# Patient Record
Sex: Female | Born: 1970 | Race: Black or African American | Hispanic: No | Marital: Single | Smoking: Former smoker
Health system: Southern US, Community
[De-identification: ages and names within clinical notes are randomized; demographics above are authoritative.]

## PROBLEM LIST (undated history)

## (undated) DIAGNOSIS — M25551 Pain in right hip: Secondary | ICD-10-CM

## (undated) DIAGNOSIS — M25552 Pain in left hip: Secondary | ICD-10-CM

## (undated) DIAGNOSIS — Z1231 Encounter for screening mammogram for malignant neoplasm of breast: Secondary | ICD-10-CM

## (undated) DIAGNOSIS — N63 Unspecified lump in unspecified breast: Secondary | ICD-10-CM

## (undated) DIAGNOSIS — F329 Major depressive disorder, single episode, unspecified: Secondary | ICD-10-CM

## (undated) DIAGNOSIS — F32A Depression, unspecified: Secondary | ICD-10-CM

## (undated) DIAGNOSIS — F319 Bipolar disorder, unspecified: Secondary | ICD-10-CM

## (undated) DIAGNOSIS — I10 Essential (primary) hypertension: Secondary | ICD-10-CM

## (undated) DIAGNOSIS — F29 Unspecified psychosis not due to a substance or known physiological condition: Secondary | ICD-10-CM

## (undated) DIAGNOSIS — G5600 Carpal tunnel syndrome, unspecified upper limb: Secondary | ICD-10-CM

## (undated) DIAGNOSIS — F1910 Other psychoactive substance abuse, uncomplicated: Secondary | ICD-10-CM

## (undated) DIAGNOSIS — M79606 Pain in leg, unspecified: Secondary | ICD-10-CM

## (undated) DIAGNOSIS — N6320 Unspecified lump in the left breast, unspecified quadrant: Secondary | ICD-10-CM

## (undated) HISTORY — DX: Carpal tunnel syndrome, unspecified upper limb: G56.00

## (undated) HISTORY — PX: TONSILLECTOMY: SUR1361

## (undated) HISTORY — PX: APPENDECTOMY: SHX54

---

## 2005-04-06 ENCOUNTER — Ambulatory Visit: Payer: Self-pay | Admitting: Cardiology

## 2005-06-01 ENCOUNTER — Ambulatory Visit: Payer: Self-pay

## 2005-06-01 ENCOUNTER — Encounter: Payer: Self-pay | Admitting: Cardiology

## 2005-09-09 ENCOUNTER — Inpatient Hospital Stay (HOSPITAL_COMMUNITY): Admission: AD | Admit: 2005-09-09 | Discharge: 2005-09-12 | Payer: Self-pay | Admitting: Obstetrics

## 2006-07-27 ENCOUNTER — Inpatient Hospital Stay (HOSPITAL_COMMUNITY): Admission: AD | Admit: 2006-07-27 | Discharge: 2006-07-27 | Payer: Self-pay | Admitting: Obstetrics

## 2006-10-05 ENCOUNTER — Inpatient Hospital Stay (HOSPITAL_COMMUNITY): Admission: AD | Admit: 2006-10-05 | Discharge: 2006-10-05 | Payer: Self-pay | Admitting: Obstetrics

## 2006-10-17 ENCOUNTER — Inpatient Hospital Stay (HOSPITAL_COMMUNITY): Admission: AD | Admit: 2006-10-17 | Discharge: 2006-10-20 | Payer: Self-pay | Admitting: Obstetrics

## 2006-10-18 ENCOUNTER — Encounter (INDEPENDENT_AMBULATORY_CARE_PROVIDER_SITE_OTHER): Payer: Self-pay | Admitting: *Deleted

## 2008-06-13 ENCOUNTER — Ambulatory Visit (HOSPITAL_COMMUNITY): Admission: RE | Admit: 2008-06-13 | Discharge: 2008-06-13 | Payer: Self-pay | Admitting: Cardiovascular Disease

## 2008-09-17 ENCOUNTER — Emergency Department (HOSPITAL_COMMUNITY): Admission: EM | Admit: 2008-09-17 | Discharge: 2008-09-17 | Payer: Self-pay | Admitting: *Deleted

## 2009-01-23 ENCOUNTER — Emergency Department (HOSPITAL_COMMUNITY): Admission: EM | Admit: 2009-01-23 | Discharge: 2009-01-23 | Payer: Self-pay | Admitting: Emergency Medicine

## 2009-04-09 ENCOUNTER — Encounter: Admission: RE | Admit: 2009-04-09 | Discharge: 2009-04-09 | Payer: Self-pay | Admitting: Cardiovascular Disease

## 2009-04-21 ENCOUNTER — Ambulatory Visit (HOSPITAL_COMMUNITY): Admission: RE | Admit: 2009-04-21 | Discharge: 2009-04-21 | Payer: Self-pay | Admitting: Obstetrics

## 2010-08-02 ENCOUNTER — Encounter: Payer: Self-pay | Admitting: Cardiovascular Disease

## 2010-08-13 ENCOUNTER — Inpatient Hospital Stay (INDEPENDENT_AMBULATORY_CARE_PROVIDER_SITE_OTHER)
Admission: RE | Admit: 2010-08-13 | Discharge: 2010-08-13 | Disposition: A | Discharge status: Home / Self Care | Payer: Self-pay | Source: Ambulatory Visit | Attending: Emergency Medicine | Admitting: Emergency Medicine

## 2010-08-13 DIAGNOSIS — J019 Acute sinusitis, unspecified: Secondary | ICD-10-CM

## 2010-09-02 ENCOUNTER — Observation Stay: Admission: AD | Admit: 2010-09-02 | Payer: Self-pay | Source: Ambulatory Visit | Admitting: Cardiovascular Disease

## 2010-09-02 ENCOUNTER — Emergency Department (HOSPITAL_COMMUNITY): Payer: 59

## 2010-09-02 ENCOUNTER — Encounter (HOSPITAL_COMMUNITY): Payer: Self-pay | Admitting: Radiology

## 2010-09-02 ENCOUNTER — Observation Stay (HOSPITAL_COMMUNITY)
Admission: EM | Admit: 2010-09-02 | Discharge: 2010-09-03 | Disposition: A | Discharge status: Home / Self Care | Payer: 59 | Attending: Cardiovascular Disease | Admitting: Cardiovascular Disease

## 2010-09-02 DIAGNOSIS — R599 Enlarged lymph nodes, unspecified: Secondary | ICD-10-CM | POA: Insufficient documentation

## 2010-09-02 DIAGNOSIS — R509 Fever, unspecified: Secondary | ICD-10-CM | POA: Insufficient documentation

## 2010-09-02 DIAGNOSIS — M542 Cervicalgia: Secondary | ICD-10-CM | POA: Insufficient documentation

## 2010-09-02 DIAGNOSIS — J020 Streptococcal pharyngitis: Principal | ICD-10-CM | POA: Insufficient documentation

## 2010-09-02 HISTORY — DX: Essential (primary) hypertension: I10

## 2010-09-02 LAB — CBC
HCT: 34.1 % — ABNORMAL LOW (ref 36.0–46.0)
Hemoglobin: 11.8 g/dL — ABNORMAL LOW (ref 12.0–15.0)
MCH: 30.6 pg (ref 26.0–34.0)
MCHC: 34.6 g/dL (ref 30.0–36.0)
Platelets: 227 10*3/uL (ref 150–400)
RBC: 3.86 MIL/uL — ABNORMAL LOW (ref 3.87–5.11)
WBC: 10.8 10*3/uL — ABNORMAL HIGH (ref 4.0–10.5)

## 2010-09-02 LAB — BASIC METABOLIC PANEL
BUN: 3 mg/dL — ABNORMAL LOW (ref 6–23)
Calcium: 8.9 mg/dL (ref 8.4–10.5)
Glucose, Bld: 91 mg/dL (ref 70–99)
Potassium: 3.2 mEq/L — ABNORMAL LOW (ref 3.5–5.1)

## 2010-09-02 LAB — DIFFERENTIAL
Basophils Absolute: 0 10*3/uL (ref 0.0–0.1)
Basophils Relative: 0 % (ref 0–1)
Eosinophils Relative: 0 % (ref 0–5)
Lymphs Abs: 1.5 10*3/uL (ref 0.7–4.0)
Monocytes Absolute: 0.9 10*3/uL (ref 0.1–1.0)
Neutro Abs: 8.3 10*3/uL — ABNORMAL HIGH (ref 1.7–7.7)
Neutrophils Relative %: 77 % (ref 43–77)

## 2010-09-02 LAB — RAPID STREP SCREEN (MED CTR MEBANE ONLY): Streptococcus, Group A Screen (Direct): POSITIVE — AB

## 2010-09-08 LAB — CULTURE, BLOOD (ROUTINE X 2): Culture: NO GROWTH

## 2010-09-16 NOTE — Discharge Summary (Signed)
  [name], [name]            ACCOUNT NO.:  [id number]  MEDICAL RECORD NO.:  [id number]           PATIENT TYPE:  I  LOCATION:  5149                         FACILITY:  [facility]  PHYSICIAN:  Ricki Rodriguez, M.D.  DATE OF BIRTH:  02-03-1971  DATE OF ADMISSION:  09/02/2010 DATE OF DISCHARGE:  09/03/2010                              DISCHARGE SUMMARY   FINAL DIAGNOSES: 1. Streptococcal pharyngitis with cervical lymphadenopathy. 2. Anxiety. 3. Dysphagia secondary to Streptococcal pharyngitis and     lymphadenopathy.  DISCHARGE MEDICATIONS: 1. Amoxicillin 500 mg 1 every 8 hours x10 days. 2. Acetaminophen 325 mg 1 every 6 hours as needed. 3. Loratadine 10 mg 1 daily. 4. Oxycodone 5 mg 1 every 6 hours as needed for 5 days. 5. Celexa 20 mg 1 daily. 6. Ferrous sulfate 325 mg 1 every other day. 7. Metoprolol XL succinate 25 mg daily. 8. Multivitamin 1 daily. 9. Vitamin C 500 mg daily.  DISCHARGE DIET:  Low-sodium, heart-healthy diet.  DISCHARGE ACTIVITY:  The patient is to increase activity slowly as tolerated.  Followup by Dr. Orpah Cobb in 10 days.  The patient is to call [phone] for appointment.  HISTORY:  This is a 40 year old black female with a 1-week history of sore throat and neck pain, had been feeling weak, dizzy, and difficulty swallowing.  The patient did not have any antibiotic treatment.  PHYSICAL EXAMINATION:  VITAL SIGNS:  Temperature 102.8, pulse 96, respirations 20, blood pressure 139/75, oxygen saturation 100%. GENERAL:  The patient is a well-built, well-nourished black female, in some respiratory distress. HEENT:  The patient is normocephalic, atraumatic with brown eyes. Conjunctivae pink.  Throat red. NECK:  Tender anteriorly bilaterally. LUNGS:  Clear bilaterally. HEART:  Normal S1 and S2. ABDOMEN:  Soft. EXTREMITIES:  No edema, cyanosis, or clubbing. SKIN:  Warm and dry. NEUROLOGIC:  The patient moves all 4 extremities.  She is alert  and oriented x3.  LABORATORY DATA:  Revealed near normal hemoglobin, hematocrit, WBC count, platelet count, near normal electrolytes except for potassium of 3.2.  Blood cultures negative.  Strep group A screen positive.  HOSPITAL COURSE:  The patient was placed in observation.  She had x-ray of the soft tissue neck that failed to show any epiglottitis.  Her strep screen was positive for which she received IV antibiotic followed by amoxicillin for 10 days.  Her condition improved with 1 day of IV hydration and on September 03, 2010, she was discharged home in satisfactory condition with followup by me in 10 days.     Ricki Rodriguez, M.D.     ASK/MEDQ  D:  09/14/2010  T:  09/14/2010  Job:  [id number]  Electronically Signed by Orpah Cobb M.D. on 09/15/2010 01:29:25 PM

## 2010-10-17 LAB — WET PREP, GENITAL: Yeast Wet Prep HPF POC: NONE SEEN

## 2010-10-17 LAB — URINE MICROSCOPIC-ADD ON

## 2010-10-17 LAB — URINALYSIS, ROUTINE W REFLEX MICROSCOPIC
Nitrite: NEGATIVE
Protein, ur: NEGATIVE mg/dL
Specific Gravity, Urine: 1.004 — ABNORMAL LOW (ref 1.005–1.030)
Urobilinogen, UA: 0.2 mg/dL (ref 0.0–1.0)

## 2010-11-26 NOTE — Discharge Summary (Signed)
NAMECATHLEEN, [name]            ACCOUNT NO.:  [id number]   MEDICAL RECORD NO.:  [id number]          PATIENT TYPE:  INP   LOCATION:  9136                          FACILITY:  [facility]   PHYSICIAN:  Charles A. Clearance Coots, M.D.DATE OF BIRTH:  1970-11-04   DATE OF ADMISSION:  10/17/2006  DATE OF DISCHARGE:  10/20/2006                               DISCHARGE SUMMARY   ADMITTING DIAGNOSES:  1. Term pregnancy.  2. Spontaneous rupture of membranes.  3. Pregnancy-induced hypertension.  4. Multiparity.  5. Desired permanent sterilization.   DISCHARGE DIAGNOSES:  1. Term pregnancy.  2. Spontaneous rupture of membranes.  3. Pregnancy-induced hypertension.  4. Multiparity.  5. Desired permanent sterilization.  6. Status post normal spontaneous vaginal delivery viable female infant      on October 18, 2006, at 0050, Apgars of 9 at one and 9 at five      minutes, weight of 3810 g, length of 56 cm.  7. Status post postpartum tubal ligation on postoperative day #1.  8. Discharged home on postpartum day #2 in good condition.   REASON FOR ADMISSION:  A 40 year old black female, G7, P5-1-0-6,  estimated date of confinement of October 28, 2006, presented to the office  with leaking of clear fluid.  She also complained of headache.  Prenatal  care was uncomplicated.  Group B strep was positive.  The patient  desired permanent sterilization after delivery of this baby.   PAST MEDICAL HISTORY:   SURGERY:  1. Appendectomy.  2. Tonsillectomy.  3. Ablation for SVT.   ILLNESSES:  SVT.   MEDICATIONS:  Prenatal vitamins, Prevacid.   ALLERGIES:  No known drug allergies.   SOCIAL HISTORY:  Divorced; negative for tobacco, alcohol, or  recreational drug use.   PHYSICAL EXAMINATION:  VITAL SIGNS:  Afebrile, blood pressure 160/105.  LUNGS:  Clear to auscultation bilaterally.  HEART:  Regular rate and rhythm.  ABDOMEN:  Gravid, nontender.  CERVIX:  5 cm dilated, 70% effaced, and vertex at a -2 station.  External fetal monitor revealed uterine contractions every 5-8 minutes.  Tracing was reactive.   ADMITTING LABORATORY VALUES:  Hemoglobin 13, hematocrit 39, white blood  cell count 7500, platelets 268,000.   HOSPITAL COURSE:  The patient was admitted and progressed rapidly to  normal spontaneous vaginal delivery without complications.  She was  taken to the operating room for postpartum tubal ligation on postpartum  day 0, and bilateral partial salpingectomy was performed without  complications.  The remainder of the postpartum course was  uncomplicated.  The patient was discharged home on postpartum day #2 in  good condition.   DISCHARGE DISPOSITION:   MEDICATIONS:  Darvocet-N 100 and ibuprofen as prescribed for pain.  Continue prenatal vitamins.   Routine written instructions were given for discharge after vaginal  delivery and tubal ligation.  The patient is to call our office for a  followup appointment in 6 weeks.      Charles A. Clearance Coots, M.D.  Electronically Signed     CAH/MEDQ  D:  10/20/2006  T:  10/20/2006  Job:  [id number]

## 2010-11-26 NOTE — Op Note (Signed)
NAMEHILLARIE, [name]            ACCOUNT NO.:  [id number]   MEDICAL RECORD NO.:  [id number]          PATIENT TYPE:  INP   LOCATION:  9374                          FACILITY:  [facility]   PHYSICIAN:  Charles A. Clearance Coots, M.D.DATE OF BIRTH:  1971-02-19   DATE OF PROCEDURE:  10/18/2006  DATE OF DISCHARGE:                               OPERATIVE REPORT   PREOPERATIVE DIAGNOSIS:  Desires sterilization.   POSTOPERATIVE DIAGNOSIS:  Desires sterilization.   PROCEDURE:  Bilateral partial salpingectomy.   SURGEON:  Coral Ceo, M.D.   ANESTHESIA:  General.   ESTIMATED BLOOD LOSS:  Negligible.   COMPLICATIONS:  None.   SPECIMEN:  Approximately 2-cm segments of right and left fallopian  tubes.   OPERATION:  The patient was brought to the operating room, and after  satisfactory general endotracheal anesthesia, the abdomen was prepped  and draped in usual sterile fashion.  A small inferior umbilical  incision was made with the scalpel that was deepened down to the fascia  with curved Mayo scissors bluntly.  The fascia was grasped in the  midline with Kelly forceps and was incised transversely down through the  peritoneum with curved Mayo scissors.  The fascial incision was extended  to the left to the right with the curved Mayo scissors.  The right angle  retractors were placed in the incision.  The right fallopian tube was  identified and was grasped with a Babcock clamp.  Tube was followed from  the corneal end to the fimbrial end and grasped with Babcock clamps and  then regrapsed in the isthmic area of the tube with the Babcock clamp.  Knuckle of tube beneath the Babcock clamp was ligated with 0 plain  catgut, and the section of tube above the knot was excised with  Metzenbaum scissors and submitted to pathology for evaluation.  There  was no active bleeding from the tubal stumps, and they were placed back  in their normal anatomic position.  Same procedure was performed on the  opposite side without complications.  The abdomen was then closed as  follows:  Peritoneum and fascia was closed as one with a continuous  subcuticular suture of 3-0 Monocryl.  Sterile bandage was applied to the  incision closure.  Surgical technician indicated that all needle, sponge  and instrument counts were correct x2.  The patient tolerated the  procedure well, was transported to the recovery room in satisfactory  condition.      Charles A. Clearance Coots, M.D.  Electronically Signed    CAH/MEDQ  D:  10/18/2006  T:  10/18/2006  Job:  [id number]

## 2011-01-11 ENCOUNTER — Other Ambulatory Visit: Payer: Self-pay | Admitting: Cardiovascular Disease

## 2011-01-11 ENCOUNTER — Ambulatory Visit
Admission: RE | Admit: 2011-01-11 | Discharge: 2011-01-11 | Disposition: A | Discharge status: Home / Self Care | Payer: 59 | Source: Ambulatory Visit | Attending: Cardiovascular Disease | Admitting: Cardiovascular Disease

## 2011-01-11 DIAGNOSIS — M25559 Pain in unspecified hip: Secondary | ICD-10-CM

## 2011-01-17 ENCOUNTER — Emergency Department (HOSPITAL_COMMUNITY)
Admission: EM | Admit: 2011-01-17 | Discharge: 2011-01-17 | Disposition: A | Discharge status: Other Acute Inpatient Hospital | Payer: 59 | Attending: Emergency Medicine | Admitting: Emergency Medicine

## 2011-01-17 DIAGNOSIS — N760 Acute vaginitis: Secondary | ICD-10-CM | POA: Insufficient documentation

## 2011-01-17 DIAGNOSIS — A499 Bacterial infection, unspecified: Secondary | ICD-10-CM | POA: Insufficient documentation

## 2011-01-17 DIAGNOSIS — B9689 Other specified bacterial agents as the cause of diseases classified elsewhere: Secondary | ICD-10-CM | POA: Insufficient documentation

## 2011-01-17 DIAGNOSIS — F1910 Other psychoactive substance abuse, uncomplicated: Secondary | ICD-10-CM | POA: Insufficient documentation

## 2011-01-17 DIAGNOSIS — I498 Other specified cardiac arrhythmias: Secondary | ICD-10-CM | POA: Insufficient documentation

## 2011-01-17 DIAGNOSIS — F1010 Alcohol abuse, uncomplicated: Secondary | ICD-10-CM | POA: Insufficient documentation

## 2011-01-17 DIAGNOSIS — N72 Inflammatory disease of cervix uteri: Secondary | ICD-10-CM | POA: Insufficient documentation

## 2011-01-17 DIAGNOSIS — I10 Essential (primary) hypertension: Secondary | ICD-10-CM | POA: Insufficient documentation

## 2011-01-17 DIAGNOSIS — Z139 Encounter for screening, unspecified: Secondary | ICD-10-CM | POA: Insufficient documentation

## 2011-01-17 DIAGNOSIS — R45851 Suicidal ideations: Secondary | ICD-10-CM | POA: Insufficient documentation

## 2011-01-17 DIAGNOSIS — N390 Urinary tract infection, site not specified: Secondary | ICD-10-CM | POA: Insufficient documentation

## 2011-01-17 DIAGNOSIS — A599 Trichomoniasis, unspecified: Secondary | ICD-10-CM | POA: Insufficient documentation

## 2011-01-17 LAB — DIFFERENTIAL
Basophils Absolute: 0 10*3/uL (ref 0.0–0.1)
Basophils Relative: 0 % (ref 0–1)
Eosinophils Absolute: 0.1 10*3/uL (ref 0.0–0.7)
Eosinophils Relative: 2 % (ref 0–5)
Lymphocytes Relative: 55 % — ABNORMAL HIGH (ref 12–46)
Lymphs Abs: 2.5 10*3/uL (ref 0.7–4.0)
Monocytes Relative: 9 % (ref 3–12)
Neutrophils Relative %: 35 % — ABNORMAL LOW (ref 43–77)

## 2011-01-17 LAB — URINALYSIS, ROUTINE W REFLEX MICROSCOPIC
Bilirubin Urine: NEGATIVE
Glucose, UA: NEGATIVE mg/dL
Ketones, ur: NEGATIVE mg/dL
Nitrite: NEGATIVE
Urobilinogen, UA: 0.2 mg/dL (ref 0.0–1.0)

## 2011-01-17 LAB — RAPID URINE DRUG SCREEN, HOSP PERFORMED: Opiates: POSITIVE — AB

## 2011-01-17 LAB — CBC
HCT: 41.5 % (ref 36.0–46.0)
Hemoglobin: 14 g/dL (ref 12.0–15.0)
MCV: 89.8 fL (ref 78.0–100.0)
RDW: 12.5 % (ref 11.5–15.5)
WBC: 4.6 10*3/uL (ref 4.0–10.5)

## 2011-01-17 LAB — COMPREHENSIVE METABOLIC PANEL
Albumin: 3.9 g/dL (ref 3.5–5.2)
Alkaline Phosphatase: 40 U/L (ref 39–117)
BUN: 6 mg/dL (ref 6–23)
CO2: 24 mEq/L (ref 19–32)
Chloride: 101 mEq/L (ref 96–112)
GFR calc Af Amer: >60 mL/min (ref >60)
Glucose, Bld: 97 mg/dL (ref 70–99)
Potassium: 4.2 mEq/L (ref 3.5–5.1)
Total Bilirubin: 0.6 mg/dL (ref 0.3–1.2)
Total Protein: 8.4 g/dL — ABNORMAL HIGH (ref 6.0–8.3)

## 2011-01-17 LAB — ETHANOL: Alcohol, Ethyl (B): <11 mg/dL (ref 0–11)

## 2011-01-17 LAB — URINE MICROSCOPIC-ADD ON

## 2011-01-17 LAB — WET PREP, GENITAL

## 2011-01-18 LAB — GC/CHLAMYDIA PROBE AMP, GENITAL: GC Probe Amp, Genital: NEGATIVE

## 2011-01-19 LAB — URINE CULTURE

## 2011-05-06 ENCOUNTER — Emergency Department (HOSPITAL_COMMUNITY)
Admission: EM | Admit: 2011-05-06 | Discharge: 2011-05-06 | Disposition: A | Discharge status: Home / Self Care | Payer: 59 | Attending: Emergency Medicine | Admitting: Emergency Medicine

## 2011-05-06 DIAGNOSIS — R42 Dizziness and giddiness: Secondary | ICD-10-CM | POA: Insufficient documentation

## 2011-05-06 DIAGNOSIS — G8929 Other chronic pain: Secondary | ICD-10-CM | POA: Insufficient documentation

## 2011-05-06 DIAGNOSIS — I10 Essential (primary) hypertension: Secondary | ICD-10-CM | POA: Insufficient documentation

## 2011-05-06 DIAGNOSIS — R4789 Other speech disturbances: Secondary | ICD-10-CM | POA: Insufficient documentation

## 2011-05-06 DIAGNOSIS — Z79899 Other long term (current) drug therapy: Secondary | ICD-10-CM | POA: Insufficient documentation

## 2011-05-06 DIAGNOSIS — T50901A Poisoning by unspecified drugs, medicaments and biological substances, accidental (unintentional), initial encounter: Secondary | ICD-10-CM | POA: Insufficient documentation

## 2011-06-07 ENCOUNTER — Ambulatory Visit: Payer: Medicaid Other | Attending: Anesthesiology

## 2011-06-27 ENCOUNTER — Ambulatory Visit: Payer: Medicaid Other | Attending: Anesthesiology

## 2011-06-27 DIAGNOSIS — R262 Difficulty in walking, not elsewhere classified: Secondary | ICD-10-CM | POA: Insufficient documentation

## 2011-06-27 DIAGNOSIS — M545 Low back pain, unspecified: Secondary | ICD-10-CM | POA: Insufficient documentation

## 2011-06-27 DIAGNOSIS — M2550 Pain in unspecified joint: Secondary | ICD-10-CM | POA: Insufficient documentation

## 2011-06-27 DIAGNOSIS — IMO0001 Reserved for inherently not codable concepts without codable children: Secondary | ICD-10-CM | POA: Insufficient documentation

## 2011-06-27 DIAGNOSIS — M2560 Stiffness of unspecified joint, not elsewhere classified: Secondary | ICD-10-CM | POA: Insufficient documentation

## 2011-07-06 ENCOUNTER — Ambulatory Visit: Payer: Medicaid Other

## 2011-07-09 ENCOUNTER — Emergency Department (HOSPITAL_COMMUNITY)
Admission: EM | Admit: 2011-07-09 | Discharge: 2011-07-09 | Discharge status: Against Medical Advice | Payer: 59 | Attending: Emergency Medicine | Admitting: Emergency Medicine

## 2011-07-09 ENCOUNTER — Encounter (HOSPITAL_COMMUNITY): Payer: Self-pay | Admitting: *Deleted

## 2011-07-09 DIAGNOSIS — F411 Generalized anxiety disorder: Secondary | ICD-10-CM | POA: Insufficient documentation

## 2011-07-09 HISTORY — DX: Bipolar disorder, unspecified: F31.9

## 2011-07-09 NOTE — ED Notes (Signed)
Pt unable to be found from triage

## 2011-07-09 NOTE — ED Notes (Signed)
Pt in stating she slept all day and couldn't wake up, then tonight after waking up began to have panic attack, pt with history of same, pt cooperative at this time, still anxious

## 2011-07-13 ENCOUNTER — Ambulatory Visit: Payer: Medicaid Other | Attending: Anesthesiology

## 2011-07-13 DIAGNOSIS — R262 Difficulty in walking, not elsewhere classified: Secondary | ICD-10-CM | POA: Insufficient documentation

## 2011-07-13 DIAGNOSIS — M2550 Pain in unspecified joint: Secondary | ICD-10-CM | POA: Insufficient documentation

## 2011-07-13 DIAGNOSIS — M545 Low back pain, unspecified: Secondary | ICD-10-CM | POA: Insufficient documentation

## 2011-07-13 DIAGNOSIS — M2560 Stiffness of unspecified joint, not elsewhere classified: Secondary | ICD-10-CM | POA: Insufficient documentation

## 2011-07-13 DIAGNOSIS — IMO0001 Reserved for inherently not codable concepts without codable children: Secondary | ICD-10-CM | POA: Insufficient documentation

## 2011-07-20 ENCOUNTER — Ambulatory Visit: Payer: Medicaid Other | Admitting: Physical Therapy

## 2011-08-03 ENCOUNTER — Encounter: Payer: Medicaid Other | Admitting: Physical Therapy

## 2012-12-05 ENCOUNTER — Encounter: Payer: Self-pay | Admitting: Obstetrics

## 2013-01-30 ENCOUNTER — Ambulatory Visit: Payer: Self-pay | Admitting: Obstetrics

## 2013-02-23 ENCOUNTER — Emergency Department (HOSPITAL_COMMUNITY)
Admission: EM | Admit: 2013-02-23 | Discharge: 2013-02-23 | Disposition: A | Discharge status: Home / Self Care | Payer: Medicaid Other | Attending: Emergency Medicine | Admitting: Emergency Medicine

## 2013-02-23 ENCOUNTER — Encounter (HOSPITAL_COMMUNITY): Payer: Self-pay | Admitting: *Deleted

## 2013-02-23 DIAGNOSIS — I10 Essential (primary) hypertension: Secondary | ICD-10-CM | POA: Insufficient documentation

## 2013-02-23 DIAGNOSIS — Z3202 Encounter for pregnancy test, result negative: Secondary | ICD-10-CM | POA: Insufficient documentation

## 2013-02-23 DIAGNOSIS — F319 Bipolar disorder, unspecified: Secondary | ICD-10-CM | POA: Insufficient documentation

## 2013-02-23 DIAGNOSIS — R45851 Suicidal ideations: Secondary | ICD-10-CM | POA: Insufficient documentation

## 2013-02-23 DIAGNOSIS — Z79899 Other long term (current) drug therapy: Secondary | ICD-10-CM | POA: Insufficient documentation

## 2013-02-23 DIAGNOSIS — Z91040 Latex allergy status: Secondary | ICD-10-CM | POA: Insufficient documentation

## 2013-02-23 DIAGNOSIS — F329 Major depressive disorder, single episode, unspecified: Secondary | ICD-10-CM

## 2013-02-23 DIAGNOSIS — R209 Unspecified disturbances of skin sensation: Secondary | ICD-10-CM | POA: Insufficient documentation

## 2013-02-23 HISTORY — DX: Major depressive disorder, single episode, unspecified: F32.9

## 2013-02-23 HISTORY — DX: Other psychoactive substance abuse, uncomplicated: F19.10

## 2013-02-23 HISTORY — DX: Pain in leg, unspecified: M79.606

## 2013-02-23 HISTORY — DX: Depression, unspecified: F32.A

## 2013-02-23 HISTORY — DX: Unspecified psychosis not due to a substance or known physiological condition: F29

## 2013-02-23 LAB — URINALYSIS, ROUTINE W REFLEX MICROSCOPIC
Nitrite: NEGATIVE
Protein, ur: NEGATIVE mg/dL
Specific Gravity, Urine: 1.018 (ref 1.005–1.030)
Urobilinogen, UA: 1 mg/dL (ref 0.0–1.0)

## 2013-02-23 LAB — CBC
Hemoglobin: 12.8 g/dL (ref 12.0–15.0)
MCH: 30.7 pg (ref 26.0–34.0)
MCV: 90.2 fL (ref 78.0–100.0)
Platelets: 312 10*3/uL (ref 150–400)
RBC: 4.17 MIL/uL (ref 3.87–5.11)
WBC: 7.2 10*3/uL (ref 4.0–10.5)

## 2013-02-23 LAB — COMPREHENSIVE METABOLIC PANEL
ALT: 10 U/L (ref 0–35)
AST: 21 U/L (ref 0–37)
CO2: 27 mEq/L (ref 19–32)
Chloride: 103 mEq/L (ref 96–112)
Creatinine, Ser: 0.65 mg/dL (ref 0.50–1.10)
GFR calc Af Amer: >90 mL/min (ref >90)
GFR calc non Af Amer: >90 mL/min (ref >90)
Glucose, Bld: 97 mg/dL (ref 70–99)
Sodium: 139 mEq/L (ref 135–145)
Total Bilirubin: 0.6 mg/dL (ref 0.3–1.2)

## 2013-02-23 LAB — POCT PREGNANCY, URINE: Preg Test, Ur: NEGATIVE

## 2013-02-23 LAB — RAPID URINE DRUG SCREEN, HOSP PERFORMED
Amphetamines: NOT DETECTED
Tetrahydrocannabinol: NOT DETECTED

## 2013-02-23 MED ORDER — POTASSIUM CHLORIDE CRYS ER 20 MEQ PO TBCR
40.0000 meq | EXTENDED_RELEASE_TABLET | Freq: Once | ORAL | Status: AC
  Administered 2013-02-23: 40 meq via ORAL
  Filled 2013-02-23: qty 2

## 2013-02-23 NOTE — ED Provider Notes (Signed)
Medical screening examination/treatment/procedure(s) were performed by non-physician practitioner and as supervising physician I was immediately available for consultation/collaboration.  Dynasia Kercheval M Zadiel Leyh, MD 02/23/13 0745 

## 2013-02-23 NOTE — ED Notes (Signed)
Pt sent from [facility] for medical clearance (IVC), HTN, and leg pain. Pt states has a hx of bipolar and depression, states is SI, states has thoughts of taking pills and drinking alcohol.

## 2013-02-23 NOTE — ED Provider Notes (Signed)
CSN: [id number]     Arrival date & time 02/23/13  0145 History     First MD Initiated Contact with Patient 02/23/13 0300     Chief Complaint  Patient presents with  . Medical Clearance   HPI  History provided by the patient. Patient is a 42 year old female with history of hypertension, bipolar disorder who presents with worsening depression and suicidal ideations. Patient initially went to [facility] and was sent here for further evaluation and medical clearance. Patient states her depression has worsened over the past few weeks. She has been taking her normal medications as prescribed. She states her depression was exacerbated following her mother's death recently. Since that time she is felt like just dying and ending her life. She states she thought of drinking large amounts of alcohol and taking as many pills as she could. She denies any actual attempt at [facility]. No other aggravating or alleviating factors. The patient does have a secondary complaint of occasional right arm tingling and numbness. She states these episodes have been briefly and are not associated with any particular activity or movement. She denies pain or numbness at the wrist. No trauma or injury. No other associated symptoms.   Past Medical History  Diagnosis Date  . Hypertension   . Bipolar 1 disorder   . Depression   . Leg pain   . Substance abuse   . Psychosis    Past Surgical History  Procedure Laterality Date  . Appendectomy    . Tonsillectomy     No family history on file. History  Substance Use Topics  . Smoking status: Never Smoker   . Smokeless tobacco: Never Used  . Alcohol Use: Yes   OB History   Grav Para Term Preterm Abortions TAB SAB Ect Mult Living                 Review of Systems  All other systems reviewed and are negative.    Allergies  Latex  Home Medications   Current Outpatient Rx  Name  Route  Sig  Dispense  Refill  . gabapentin (NEURONTIN) 300 MG capsule   Oral   Take 300  mg by mouth at bedtime.         . lamoTRIgine (LAMICTAL) 200 MG tablet   Oral   Take 200 mg by mouth every evening. Total daily dose 225mg          . lamoTRIgine (LAMICTAL) 25 MG tablet   Oral   Take 25 mg by mouth every evening. Total daily dose 225mg          . lithium carbonate 300 MG capsule   Oral   Take 300-600 mg by mouth 2 (two) times daily. Take 300mg  in the morning and 600mg  at night         . metoprolol succinate (TOPROL-XL) 25 MG 24 hr tablet   Oral   Take 25 mg by mouth every morning.         . sertraline (ZOLOFT) 100 MG tablet   Oral   Take 100 mg by mouth every evening.         . traZODone (DESYREL) 50 MG tablet   Oral   Take 50-100 mg by mouth at bedtime.          BP 156/86  Pulse 72  Temp(Src) 98.1 F (36.7 C) (Oral)  Resp 18  SpO2 100%  LMP 02/09/2013 Physical Exam  Nursing note and vitals reviewed. Constitutional: She is oriented to person, place,  and time. She appears well-developed and well-nourished. No distress.  HENT:  Head: Normocephalic.  Cardiovascular: Normal rate and regular rhythm.   Pulmonary/Chest: Effort normal and breath sounds normal.  Musculoskeletal: Normal range of motion. She exhibits no edema and no tenderness.  Normal grip strength bilaterally. Normal distal pulses and sensations.  Neurological: She is alert and oriented to person, place, and time.  Skin: Skin is warm and dry. No rash noted.  Psychiatric: She has a normal mood and affect. Her behavior is normal.    ED Course   Procedures   Results for orders placed during the hospital encounter of 02/23/13  ACETAMINOPHEN LEVEL      Result Value Range   Acetaminophen (Tylenol), Serum <15.0  10 - 30 ug/mL  CBC      Result Value Range   WBC 7.2  4.0 - 10.5 K/uL   RBC 4.17  3.87 - 5.11 MIL/uL   Hemoglobin 12.8  12.0 - 15.0 g/dL   HCT 47.8  29.5 - 62.1 %   MCV 90.2  78.0 - 100.0 fL   MCH 30.7  26.0 - 34.0 pg   MCHC 34.0  30.0 - 36.0 g/dL   RDW 30.8  65.7  - 84.6 %   Platelets 312  150 - 400 K/uL  COMPREHENSIVE METABOLIC PANEL      Result Value Range   Sodium 139  135 - 145 mEq/L   Potassium 3.1 (*) 3.5 - 5.1 mEq/L   Chloride 103  96 - 112 mEq/L   CO2 27  19 - 32 mEq/L   Glucose, Bld 97  70 - 99 mg/dL   BUN <3 (*) 6 - 23 mg/dL   Creatinine, Ser 9.62  0.50 - 1.10 mg/dL   Calcium 9.4  8.4 - 95.2 mg/dL   Total Protein 7.1  6.0 - 8.3 g/dL   Albumin 3.5  3.5 - 5.2 g/dL   AST 21  0 - 37 U/L   ALT 10  0 - 35 U/L   Alkaline Phosphatase 37 (*) 39 - 117 U/L   Total Bilirubin 0.6  0.3 - 1.2 mg/dL   GFR calc non Af Amer >90  >90 mL/min   GFR calc Af Amer >90  >90 mL/min  ETHANOL      Result Value Range   Alcohol, Ethyl (B) <11  0 - 11 mg/dL  SALICYLATE LEVEL      Result Value Range   Salicylate Lvl <2.0 (*) 2.8 - 20.0 mg/dL  URINE RAPID DRUG SCREEN (HOSP PERFORMED)      Result Value Range   Opiates NONE DETECTED  NONE DETECTED   Cocaine NONE DETECTED  NONE DETECTED   Benzodiazepines NONE DETECTED  NONE DETECTED   Amphetamines NONE DETECTED  NONE DETECTED   Tetrahydrocannabinol NONE DETECTED  NONE DETECTED   Barbiturates NONE DETECTED  NONE DETECTED  URINALYSIS, ROUTINE W REFLEX MICROSCOPIC      Result Value Range   Color, Urine YELLOW  YELLOW   APPearance CLEAR  CLEAR   Specific Gravity, Urine 1.018  1.005 - 1.030   pH 6.5  5.0 - 8.0   Glucose, UA NEGATIVE  NEGATIVE mg/dL   Hgb urine dipstick NEGATIVE  NEGATIVE   Bilirubin Urine NEGATIVE  NEGATIVE   Ketones, ur NEGATIVE  NEGATIVE mg/dL   Protein, ur NEGATIVE  NEGATIVE mg/dL   Urobilinogen, UA 1.0  0.0 - 1.0 mg/dL   Nitrite NEGATIVE  NEGATIVE   Leukocytes, UA SMALL (*) NEGATIVE  URINE  MICROSCOPIC-ADD ON      Result Value Range   Squamous Epithelial / LPF FEW (*) RARE   WBC, UA 3-6  <3 WBC/hpf   Bacteria, UA RARE  RARE  POCT PREGNANCY, URINE      Result Value Range   Preg Test, Ur NEGATIVE  NEGATIVE       1. Depression   2. Suicidal ideation     MDM  3:40AM patient  seen and evaluated. Patient appears well no acute distress.   Slight hypokalemia. Potassium given.  Pt is medicaly cleared. She is stable for discharge back to [facility] where they have a bed waiting.  Angus Seller, PA-C 02/23/13 [phone]

## 2013-02-23 NOTE — ED Notes (Signed)
Report called to [place] at [facility], ok to send pt back after she receives potassium PO

## 2013-02-26 ENCOUNTER — Ambulatory Visit: Payer: Medicaid Other | Admitting: Obstetrics

## 2013-03-15 ENCOUNTER — Ambulatory Visit (HOSPITAL_COMMUNITY)
Admission: RE | Admit: 2013-03-15 | Discharge: 2013-03-15 | Disposition: A | Discharge status: Home / Self Care | Payer: Medicaid Other | Source: Ambulatory Visit | Attending: Internal Medicine | Admitting: Internal Medicine

## 2013-03-15 ENCOUNTER — Other Ambulatory Visit (HOSPITAL_COMMUNITY): Payer: Self-pay | Admitting: Internal Medicine

## 2013-03-15 DIAGNOSIS — R209 Unspecified disturbances of skin sensation: Secondary | ICD-10-CM | POA: Insufficient documentation

## 2013-03-15 DIAGNOSIS — M542 Cervicalgia: Secondary | ICD-10-CM

## 2013-03-15 DIAGNOSIS — M5030 Other cervical disc degeneration, unspecified cervical region: Secondary | ICD-10-CM | POA: Insufficient documentation

## 2013-03-15 DIAGNOSIS — M47812 Spondylosis without myelopathy or radiculopathy, cervical region: Secondary | ICD-10-CM | POA: Insufficient documentation

## 2013-04-30 ENCOUNTER — Ambulatory Visit: Payer: Medicaid Other | Admitting: Obstetrics

## 2013-06-03 ENCOUNTER — Encounter: Payer: Self-pay | Admitting: Obstetrics

## 2013-06-03 ENCOUNTER — Ambulatory Visit (INDEPENDENT_AMBULATORY_CARE_PROVIDER_SITE_OTHER): Payer: Medicaid Other | Admitting: Obstetrics

## 2013-06-03 VITALS — BP 140/87 | HR 90 | Temp 98.6°F | Ht 65.0 in | Wt 155.0 lb

## 2013-06-03 DIAGNOSIS — N921 Excessive and frequent menstruation with irregular cycle: Secondary | ICD-10-CM

## 2013-06-03 DIAGNOSIS — Z9189 Other specified personal risk factors, not elsewhere classified: Secondary | ICD-10-CM

## 2013-06-03 DIAGNOSIS — Z0000 Encounter for general adult medical examination without abnormal findings: Secondary | ICD-10-CM

## 2013-06-03 DIAGNOSIS — Z113 Encounter for screening for infections with a predominantly sexual mode of transmission: Secondary | ICD-10-CM

## 2013-06-03 LAB — CBC WITH DIFFERENTIAL/PLATELET
Basophils Absolute: 0 10*3/uL (ref 0.0–0.1)
Basophils Relative: 0 % (ref 0–1)
Eosinophils Absolute: 0.1 10*3/uL (ref 0.0–0.7)
Eosinophils Relative: 3 % (ref 0–5)
HCT: 29.6 % — ABNORMAL LOW (ref 36.0–46.0)
MCHC: 34.5 g/dL (ref 30.0–36.0)
Monocytes Absolute: 0.4 10*3/uL (ref 0.1–1.0)
Monocytes Relative: 9 % (ref 3–12)
Neutro Abs: 1.9 10*3/uL (ref 1.7–7.7)
Platelets: 338 10*3/uL (ref 150–400)
RDW: 14.2 % (ref 11.5–15.5)
WBC: 4.2 10*3/uL (ref 4.0–10.5)

## 2013-06-03 LAB — COMPREHENSIVE METABOLIC PANEL
AST: 25 U/L (ref 0–37)
Alkaline Phosphatase: 33 U/L — ABNORMAL LOW (ref 39–117)
BUN: 4 mg/dL — ABNORMAL LOW (ref 6–23)
Creat: 0.69 mg/dL (ref 0.50–1.10)
Glucose, Bld: 112 mg/dL — ABNORMAL HIGH (ref 70–99)
Total Bilirubin: 0.3 mg/dL (ref 0.3–1.2)

## 2013-06-03 NOTE — Progress Notes (Signed)
Subjective:     [name] is a 42 y.o. female here for a routine annual exam.  Current complaints: pt states that she has abnormal bleeding.  She states that she has bleeding after intercourse that last 2-3 days.  She also has had irregular bleeding since her last delivery. Pt reports no pain other than cramping. Pt would also like STD testing as well today.  Pt. Would like to have referral for colonoscopy due to family hx.  Personal health questionnaire reviewed: yes.   Gynecologic History Patient's last menstrual period was 05/27/2013. Contraception: tubal ligation Last Pap: 2012. Results were: normal Last mammogram: 04/2013. Results were: normal  Obstetric History OB History  Gravida Para Term Preterm AB SAB TAB Ectopic Multiple Living  8 7 6 1 1  1   7     # Outcome Date GA Lbr Len/2nd Weight Sex Delivery Anes PTL Lv  8 TRM 10/18/06 [redacted]w[redacted]d   M    Y  7 PRE 09/10/05 [redacted]w[redacted]d   F    Y     Comments: pre-eclampsia  6 TRM 05/19/98 [redacted]w[redacted]d   M    Y  5 TRM 09/18/96 [redacted]w[redacted]d   M    Y  4 TRM 08/23/93 [redacted]w[redacted]d   F    Y  3 TRM 01/03/89 [redacted]w[redacted]d   F    Y  2 TRM 10/09/83 [redacted]w[redacted]d   M    Y  1 TAB                The following portions of the patient's history were reviewed and updated as appropriate: allergies, current medications, past family history, past medical history, past social history, past surgical history and problem list.  Review of Systems Pertinent items are noted in HPI.    Objective:    General appearance: alert and no distress Breasts: normal appearance, no masses or tenderness Abdomen: normal findings: soft, non-tender Pelvic: cervix normal in appearance, external genitalia normal, no adnexal masses or tenderness, no cervical motion tenderness, rectovaginal septum normal, vagina normal without discharge and uterus enlarged, NT.    Assessment:    Healthy female exam.    AUB.  Irregular vaginal bleeding in between periods and after intercourse.   Plan:    Education reviewed:  safe sex/STD prevention, self breast exams and management of AUB. Contraception: tubal ligation. Follow up in: 2 weeks. Ultrasound ordered.   Colonoscopy ordered.

## 2013-06-04 ENCOUNTER — Encounter: Payer: Self-pay | Admitting: Obstetrics

## 2013-06-04 ENCOUNTER — Other Ambulatory Visit: Payer: Self-pay | Admitting: *Deleted

## 2013-06-04 DIAGNOSIS — B9689 Other specified bacterial agents as the cause of diseases classified elsewhere: Secondary | ICD-10-CM

## 2013-06-04 DIAGNOSIS — Z0000 Encounter for general adult medical examination without abnormal findings: Secondary | ICD-10-CM | POA: Insufficient documentation

## 2013-06-04 DIAGNOSIS — N921 Excessive and frequent menstruation with irregular cycle: Secondary | ICD-10-CM | POA: Insufficient documentation

## 2013-06-04 LAB — PAP IG W/ RFLX HPV ASCU

## 2013-06-04 LAB — HEPATITIS C ANTIBODY: HCV Ab: NEGATIVE

## 2013-06-04 LAB — WET PREP BY MOLECULAR PROBE
Candida species: NEGATIVE
Trichomonas vaginosis: NEGATIVE

## 2013-06-04 MED ORDER — METRONIDAZOLE 500 MG PO TABS: 500.0000 mg | ORAL_TABLET | Freq: Two times a day (BID) | ORAL | Status: DC

## 2013-06-11 ENCOUNTER — Ambulatory Visit (HOSPITAL_COMMUNITY)
Admission: RE | Admit: 2013-06-11 | Discharge: 2013-06-11 | Disposition: A | Discharge status: Home / Self Care | Payer: Medicaid Other | Source: Ambulatory Visit | Attending: Obstetrics | Admitting: Obstetrics

## 2013-06-11 ENCOUNTER — Other Ambulatory Visit: Payer: Self-pay | Admitting: Obstetrics

## 2013-06-11 DIAGNOSIS — IMO0002 Reserved for concepts with insufficient information to code with codable children: Secondary | ICD-10-CM | POA: Insufficient documentation

## 2013-06-11 DIAGNOSIS — Z0000 Encounter for general adult medical examination without abnormal findings: Secondary | ICD-10-CM

## 2013-06-11 DIAGNOSIS — N921 Excessive and frequent menstruation with irregular cycle: Secondary | ICD-10-CM

## 2013-06-11 DIAGNOSIS — D250 Submucous leiomyoma of uterus: Secondary | ICD-10-CM | POA: Insufficient documentation

## 2013-06-17 ENCOUNTER — Ambulatory Visit (INDEPENDENT_AMBULATORY_CARE_PROVIDER_SITE_OTHER): Payer: Medicaid Other | Admitting: Obstetrics

## 2013-06-17 ENCOUNTER — Encounter: Payer: Self-pay | Admitting: Obstetrics

## 2013-06-17 VITALS — BP 135/83 | HR 64 | Temp 97.9°F | Wt 153.0 lb

## 2013-06-17 DIAGNOSIS — N926 Irregular menstruation, unspecified: Secondary | ICD-10-CM

## 2013-06-17 DIAGNOSIS — D259 Leiomyoma of uterus, unspecified: Secondary | ICD-10-CM

## 2013-06-17 DIAGNOSIS — N939 Abnormal uterine and vaginal bleeding, unspecified: Secondary | ICD-10-CM

## 2013-06-17 NOTE — Progress Notes (Signed)
Subjective:     [name] is a 42 y.o. female here for a ultrasound results.  H/O AUB.  Current complaints: follow up. Pt states she had an ultrasound and is here to discuss the results.  Personal health questionnaire reviewed: yes.   Gynecologic History Patient's last menstrual period was 05/27/2013. Contraception: tubal ligation   Obstetric History OB History  Gravida Para Term Preterm AB SAB TAB Ectopic Multiple Living  8 7 6 1 1  1   7     # Outcome Date GA Lbr Len/2nd Weight Sex Delivery Anes PTL Lv  8 TRM 10/18/06 [redacted]w[redacted]d   M    Y  7 PRE 09/10/05 [redacted]w[redacted]d   F    Y     Comments: pre-eclampsia  6 TRM 05/19/98 [redacted]w[redacted]d   M    Y  5 TRM 09/18/96 [redacted]w[redacted]d   M    Y  4 TRM 08/23/93 [redacted]w[redacted]d   F    Y  3 TRM 01/03/89 [redacted]w[redacted]d   F    Y  2 TRM 10/09/83 [redacted]w[redacted]d   M    Y  1 TAB                The following portions of the patient's history were reviewed and updated as appropriate: allergies, current medications, past family history, past medical history, past social history, past surgical history and problem list.  Review of Systems Pertinent items are noted in HPI.    Objective:    No exam performed today, Consult only.    Assessment:    AUB.  Small 1.5 cm submucosal fibroid on ultrasound.   Plan:    Education reviewed: Management of AUB.    Sonohysterogram scheduled.  May be a candidate for Endometrial Ablation.

## 2013-06-18 ENCOUNTER — Encounter: Payer: Self-pay | Admitting: Internal Medicine

## 2013-06-20 ENCOUNTER — Emergency Department (HOSPITAL_COMMUNITY)
Admission: EM | Admit: 2013-06-20 | Discharge: 2013-06-21 | Disposition: A | Discharge status: Home / Self Care | Payer: Medicaid Other | Source: Home / Self Care | Attending: Emergency Medicine | Admitting: Emergency Medicine

## 2013-06-20 ENCOUNTER — Encounter (HOSPITAL_COMMUNITY): Payer: Self-pay | Admitting: Emergency Medicine

## 2013-06-20 DIAGNOSIS — F1010 Alcohol abuse, uncomplicated: Secondary | ICD-10-CM | POA: Insufficient documentation

## 2013-06-20 DIAGNOSIS — Z91040 Latex allergy status: Secondary | ICD-10-CM | POA: Insufficient documentation

## 2013-06-20 DIAGNOSIS — Z8669 Personal history of other diseases of the nervous system and sense organs: Secondary | ICD-10-CM | POA: Insufficient documentation

## 2013-06-20 DIAGNOSIS — F319 Bipolar disorder, unspecified: Secondary | ICD-10-CM | POA: Insufficient documentation

## 2013-06-20 DIAGNOSIS — T43502A Poisoning by unspecified antipsychotics and neuroleptics, intentional self-harm, initial encounter: Secondary | ICD-10-CM | POA: Insufficient documentation

## 2013-06-20 DIAGNOSIS — Z87891 Personal history of nicotine dependence: Secondary | ICD-10-CM | POA: Insufficient documentation

## 2013-06-20 DIAGNOSIS — Z79899 Other long term (current) drug therapy: Secondary | ICD-10-CM | POA: Insufficient documentation

## 2013-06-20 DIAGNOSIS — I10 Essential (primary) hypertension: Secondary | ICD-10-CM | POA: Insufficient documentation

## 2013-06-20 DIAGNOSIS — R4182 Altered mental status, unspecified: Secondary | ICD-10-CM | POA: Insufficient documentation

## 2013-06-20 DIAGNOSIS — T43294A Poisoning by other antidepressants, undetermined, initial encounter: Secondary | ICD-10-CM | POA: Insufficient documentation

## 2013-06-20 DIAGNOSIS — T1491XA Suicide attempt, initial encounter: Secondary | ICD-10-CM

## 2013-06-20 LAB — COMPREHENSIVE METABOLIC PANEL
ALT: 12 U/L (ref 0–35)
AST: 29 U/L (ref 0–37)
Alkaline Phosphatase: 41 U/L (ref 39–117)
BUN: 5 mg/dL — ABNORMAL LOW (ref 6–23)
Calcium: 8.8 mg/dL (ref 8.4–10.5)
Chloride: 108 mEq/L (ref 96–112)
Glucose, Bld: 81 mg/dL (ref 70–99)
Potassium: 4.2 mEq/L (ref 3.5–5.1)
Sodium: 140 mEq/L (ref 135–145)
Total Bilirubin: 0.6 mg/dL (ref 0.3–1.2)
Total Protein: 7.4 g/dL (ref 6.0–8.3)

## 2013-06-20 LAB — CBC
HCT: 33.6 % — ABNORMAL LOW (ref 36.0–46.0)
Hemoglobin: 11.8 g/dL — ABNORMAL LOW (ref 12.0–15.0)
MCH: 30.1 pg (ref 26.0–34.0)
MCHC: 35.1 g/dL (ref 30.0–36.0)
MCV: 85.7 fL (ref 78.0–100.0)
Platelets: 272 10*3/uL (ref 150–400)
WBC: 4.3 10*3/uL (ref 4.0–10.5)

## 2013-06-20 LAB — ETHANOL: Alcohol, Ethyl (B): 96 mg/dL — ABNORMAL HIGH (ref 0–11)

## 2013-06-20 LAB — SALICYLATE LEVEL: Salicylate Lvl: <2 mg/dL — ABNORMAL LOW (ref 2.8–20.0)

## 2013-06-20 MED ORDER — ONDANSETRON HCL 4 MG PO TABS
4.0000 mg | ORAL_TABLET | Freq: Three times a day (TID) | ORAL | Status: DC | PRN
Start: 2013-06-20 — End: 2013-06-21

## 2013-06-20 MED ORDER — ACETAMINOPHEN 325 MG PO TABS
650.0000 mg | ORAL_TABLET | ORAL | Status: DC | PRN
  Administered 2013-06-20: 650 mg via ORAL
  Filled 2013-06-20: qty 2

## 2013-06-20 MED ORDER — SODIUM CHLORIDE 0.9 % IV BOLUS (SEPSIS): 1000.0000 mL | Freq: Once | INTRAVENOUS | Status: DC

## 2013-06-20 MED ORDER — LORAZEPAM 1 MG PO TABS
1.0000 mg | ORAL_TABLET | Freq: Three times a day (TID) | ORAL | Status: DC | PRN
  Administered 2013-06-20: 1 mg via ORAL
  Filled 2013-06-20: qty 1

## 2013-06-20 MED ORDER — IBUPROFEN 200 MG PO TABS: 600.0000 mg | ORAL_TABLET | Freq: Three times a day (TID) | ORAL | Status: DC | PRN

## 2013-06-20 MED ORDER — ZOLPIDEM TARTRATE 5 MG PO TABS: 5.0000 mg | ORAL_TABLET | Freq: Every evening | ORAL | Status: DC | PRN

## 2013-06-20 MED ORDER — NICOTINE 21 MG/24HR TD PT24: 21.0000 mg | MEDICATED_PATCH | Freq: Every day | TRANSDERMAL | Status: DC

## 2013-06-20 NOTE — ED Notes (Signed)
Pt arrived to unit, states she is anxious and feels like "going off". Pt given Ativan po. No s/s of distress noted. Pt denies SI/HI.

## 2013-06-20 NOTE — ED Notes (Signed)
Bed: ZO10 Expected date:  Expected time:  Means of arrival:  Comments: EMS-ETOH and trazadone

## 2013-06-20 NOTE — ED Provider Notes (Signed)
CSN: [id number]     Arrival date & time 06/20/13  1923 History   First MD Initiated Contact with Patient 06/20/13 2000     Chief Complaint  Patient presents with  . Alcohol Intoxication  . Medical Clearance  . Suicidal   (Consider location/radiation/quality/duration/timing/severity/associated sxs/prior Treatment) HPI A LEVEL 5 CAVEAT PERTAINS DUE TO ALTERED MENTAL STATUS Pt presents after drinking alcohol and taking trazodonde.  Per EMS she was found lying on her couch at home.  On my evaluation she states "I was supposed to be in heaven"  She denies taking any other substances  Past Medical History  Diagnosis Date  . Hypertension   . Bipolar 1 disorder   . Depression   . Leg pain   . Substance abuse   . Psychosis   . Carpal tunnel syndrome    Past Surgical History  Procedure Laterality Date  . Appendectomy    . Tonsillectomy     Family History  Problem Relation Age of Onset  . Cancer Mother   . Cancer Father   . Diabetes Father   . Cancer Maternal Grandmother   . Cancer Maternal Grandfather   . Cancer Paternal Grandmother    History  Substance Use Topics  . Smoking status: Former Smoker    Quit date: 07/11/1996  . Smokeless tobacco: Never Used  . Alcohol Use: Yes     Comment: Socially    OB History   Grav Para Term Preterm Abortions TAB SAB Ect Mult Living   8 7 6 1 1 1    7      Review of Systems UNABLE TO OBTAIN ROS DUE TO LEVEL 5 CAVEAT Allergies  Latex  Home Medications   Current Outpatient Rx  Name  Route  Sig  Dispense  Refill  . sertraline (ZOLOFT) 100 MG tablet   Oral   Take 200 mg by mouth every evening.          . traZODone (DESYREL) 50 MG tablet   Oral   Take 50-100 mg by mouth at bedtime as needed for sleep.          . ARIPiprazole (ABILIFY) 10 MG tablet   Oral   Take 10 mg by mouth daily.         Marland Kitchen gabapentin (NEURONTIN) 300 MG capsule   Oral   Take 900 mg by mouth at bedtime.          . lamoTRIgine (LAMICTAL) 200 MG  tablet   Oral   Take 200 mg by mouth every evening. Total daily dose 225mg          . lamoTRIgine (LAMICTAL) 25 MG tablet   Oral   Take 25 mg by mouth every evening. Total daily dose 225mg          . lithium carbonate 300 MG capsule   Oral   Take 300-600 mg by mouth 2 (two) times daily. Take 300mg  in the morning and 600mg  at night         . metoprolol succinate (TOPROL-XL) 25 MG 24 hr tablet   Oral   Take 25 mg by mouth every morning.         . metroNIDAZOLE (FLAGYL) 500 MG tablet   Oral   Take 1 tablet (500 mg total) by mouth 2 (two) times daily.   14 tablet   0    BP 140/87  Pulse 80  Temp(Src) 98.2 F (36.8 C) (Oral)  Resp 18  SpO2 100%  LMP 05/27/2013 Vitals  reivewed Physical Exam Physical Examination: General appearance - alert, intoxicated appearing, and in no distress Mental status - alert, oriented to person, not to place and time Eyes - pupils equal and reactive, no nystagmus, no conjunctival injection or scleral icterus Mouth - mucous membranes moist, pharynx normal without lesions Chest - clear to auscultation, no wheezes, rales or rhonchi, symmetric air entry Heart - normal rate, regular rhythm, normal S1, S2, no murmurs, rubs, clicks or gallops Abdomen - soft, nontender, nondistended, no masses or organomegaly Extremities - peripheral pulses normal, no pedal edema, no clubbing or cyanosis Skin - normal coloration and turgor, no rashes, no suspicious skin lesions noted Psych- decreased mental status, sleepy appearing, answering questions intermittently  ED Course  Procedures (including critical care time) Labs Review Labs Reviewed  CBC - Abnormal; Notable for the following:    Hemoglobin 11.8 (*)    HCT 33.6 (*)    All other components within normal limits  COMPREHENSIVE METABOLIC PANEL - Abnormal; Notable for the following:    BUN 5 (*)    Albumin 3.3 (*)    All other components within normal limits  ETHANOL - Abnormal; Notable for the  following:    Alcohol, Ethyl (B) 96 (*)    All other components within normal limits  SALICYLATE LEVEL - Abnormal; Notable for the following:    Salicylate Lvl <2.0 (*)    All other components within normal limits  ACETAMINOPHEN LEVEL  URINE RAPID DRUG SCREEN (HOSP PERFORMED)  LITHIUM LEVEL   Imaging Review No results found.  EKG Interpretation    Date/Time:  Thursday June 20 2013 20:34:54 EST Ventricular Rate:  77 PR Interval:  167 QRS Duration: 89 QT Interval:  397 QTC Calculation: 449 R Axis:   83 Text Interpretation:  Sinus rhythm Probable anteroseptal infarct, old nonspecific t wave abnormalities No significant change since last tracing Confirmed by [facility]  MD, Anthea Udovich [phone]) on 06/20/2013 11:30:55 PM            MDM   1. Overdose, initial encounter   2. Suicide attempt    Pt presenting after apparent suicide attempt- drinking alcohol and took 2 trazadone.  Workup reassuring. On recheck pt is much more awake,continues to state she wanted to die and is disappointed that she is here and "not in heaven".  Pt moved to psych ED and will need psych evaluation.      Ethelda Chick, MD 06/20/13 [phone]

## 2013-06-20 NOTE — ED Notes (Signed)
Per EMS: Pt from home found lying on couch. Used stimulation to arouse pt that she responded to. Pt goes in and out while talking. Pt states that she had one 40 oz and took 2 trazodone's.

## 2013-06-20 NOTE — ED Notes (Signed)
Pt refuses to give urine sample and in and out cath. Rn aware

## 2013-06-21 ENCOUNTER — Encounter (HOSPITAL_COMMUNITY): Payer: Self-pay

## 2013-06-21 ENCOUNTER — Inpatient Hospital Stay (HOSPITAL_COMMUNITY)
Admission: EM | Admit: 2013-06-21 | Discharge: 2013-07-01 | DRG: 897 | Disposition: A | Discharge status: Home / Self Care | Payer: Medicaid Other | Source: Intra-hospital | Attending: Psychiatry | Admitting: Psychiatry

## 2013-06-21 DIAGNOSIS — Z79899 Other long term (current) drug therapy: Secondary | ICD-10-CM

## 2013-06-21 DIAGNOSIS — F323 Major depressive disorder, single episode, severe with psychotic features: Secondary | ICD-10-CM

## 2013-06-21 DIAGNOSIS — I10 Essential (primary) hypertension: Secondary | ICD-10-CM | POA: Diagnosis present

## 2013-06-21 DIAGNOSIS — F3162 Bipolar disorder, current episode mixed, moderate: Secondary | ICD-10-CM

## 2013-06-21 DIAGNOSIS — T50902A Poisoning by unspecified drugs, medicaments and biological substances, intentional self-harm, initial encounter: Secondary | ICD-10-CM

## 2013-06-21 DIAGNOSIS — F10229 Alcohol dependence with intoxication, unspecified: Secondary | ICD-10-CM

## 2013-06-21 DIAGNOSIS — F4310 Post-traumatic stress disorder, unspecified: Secondary | ICD-10-CM

## 2013-06-21 DIAGNOSIS — F3130 Bipolar disorder, current episode depressed, mild or moderate severity, unspecified: Secondary | ICD-10-CM

## 2013-06-21 DIAGNOSIS — F1920 Other psychoactive substance dependence, uncomplicated: Secondary | ICD-10-CM

## 2013-06-21 DIAGNOSIS — F1020 Alcohol dependence, uncomplicated: Principal | ICD-10-CM

## 2013-06-21 DIAGNOSIS — T50901A Poisoning by unspecified drugs, medicaments and biological substances, accidental (unintentional), initial encounter: Secondary | ICD-10-CM

## 2013-06-21 DIAGNOSIS — R45851 Suicidal ideations: Secondary | ICD-10-CM

## 2013-06-21 DIAGNOSIS — F29 Unspecified psychosis not due to a substance or known physiological condition: Secondary | ICD-10-CM

## 2013-06-21 DIAGNOSIS — F411 Generalized anxiety disorder: Secondary | ICD-10-CM | POA: Diagnosis present

## 2013-06-21 DIAGNOSIS — N921 Excessive and frequent menstruation with irregular cycle: Secondary | ICD-10-CM

## 2013-06-21 LAB — LITHIUM LEVEL: Lithium Lvl: <0.25 mEq/L — ABNORMAL LOW (ref 0.80–1.40)

## 2013-06-21 MED ORDER — CHLORDIAZEPOXIDE HCL 25 MG PO CAPS
25.0000 mg | ORAL_CAPSULE | ORAL | Status: AC
  Administered 2013-06-24 (×2): 25 mg via ORAL
  Filled 2013-06-21 (×2): qty 1

## 2013-06-21 MED ORDER — QUETIAPINE FUMARATE 50 MG PO TABS
50.0000 mg | ORAL_TABLET | Freq: Two times a day (BID) | ORAL | Status: DC
  Administered 2013-06-22 – 2013-06-23 (×2): 50 mg via ORAL
  Filled 2013-06-21 (×7): qty 1

## 2013-06-21 MED ORDER — THIAMINE HCL 100 MG/ML IJ SOLN
100.0000 mg | Freq: Once | INTRAMUSCULAR | Status: AC
  Administered 2013-06-21: 100 mg via INTRAMUSCULAR
  Filled 2013-06-21: qty 2

## 2013-06-21 MED ORDER — LOPERAMIDE HCL 2 MG PO CAPS: 2.0000 mg | ORAL_CAPSULE | ORAL | Status: AC | PRN

## 2013-06-21 MED ORDER — ALUM & MAG HYDROXIDE-SIMETH 200-200-20 MG/5ML PO SUSP: 30.0000 mL | ORAL | Status: DC | PRN

## 2013-06-21 MED ORDER — HYDROXYZINE HCL 25 MG PO TABS
25.0000 mg | ORAL_TABLET | Freq: Four times a day (QID) | ORAL | Status: AC | PRN
  Administered 2013-06-23 – 2013-06-24 (×2): 25 mg via ORAL
  Filled 2013-06-21: qty 1

## 2013-06-21 MED ORDER — MAGNESIUM HYDROXIDE 400 MG/5ML PO SUSP
30.0000 mL | Freq: Every day | ORAL | Status: DC | PRN
  Administered 2013-06-24: 30 mL via ORAL

## 2013-06-21 MED ORDER — ADULT MULTIVITAMIN W/MINERALS CH
1.0000 | ORAL_TABLET | Freq: Every day | ORAL | Status: DC
  Administered 2013-06-21 – 2013-07-01 (×11): 1 via ORAL
  Filled 2013-06-21 (×14): qty 1

## 2013-06-21 MED ORDER — CHLORDIAZEPOXIDE HCL 25 MG PO CAPS
25.0000 mg | ORAL_CAPSULE | Freq: Every day | ORAL | Status: AC
  Administered 2013-06-25: 25 mg via ORAL
  Filled 2013-06-21: qty 1

## 2013-06-21 MED ORDER — CHLORDIAZEPOXIDE HCL 25 MG PO CAPS
25.0000 mg | ORAL_CAPSULE | Freq: Three times a day (TID) | ORAL | Status: AC
  Administered 2013-06-23 (×3): 25 mg via ORAL
  Filled 2013-06-21 (×4): qty 1

## 2013-06-21 MED ORDER — CHLORDIAZEPOXIDE HCL 25 MG PO CAPS
25.0000 mg | ORAL_CAPSULE | Freq: Four times a day (QID) | ORAL | Status: AC
  Administered 2013-06-21 – 2013-06-22 (×6): 25 mg via ORAL
  Filled 2013-06-21 (×5): qty 1

## 2013-06-21 MED ORDER — VITAMIN B-1 100 MG PO TABS
100.0000 mg | ORAL_TABLET | Freq: Every day | ORAL | Status: DC
  Administered 2013-06-22 – 2013-07-01 (×10): 100 mg via ORAL
  Filled 2013-06-21 (×13): qty 1

## 2013-06-21 MED ORDER — QUETIAPINE FUMARATE 50 MG PO TABS
50.0000 mg | ORAL_TABLET | Freq: Two times a day (BID) | ORAL | Status: DC
  Administered 2013-06-21: 50 mg via ORAL
  Filled 2013-06-21: qty 1

## 2013-06-21 MED ORDER — TRAZODONE HCL 50 MG PO TABS
50.0000 mg | ORAL_TABLET | Freq: Every evening | ORAL | Status: DC | PRN
  Administered 2013-06-21: 50 mg via ORAL
  Filled 2013-06-21: qty 1

## 2013-06-21 MED ORDER — QUETIAPINE FUMARATE 50 MG PO TABS
ORAL_TABLET | ORAL | Status: AC
  Filled 2013-06-21: qty 1

## 2013-06-21 MED ORDER — HYDROXYZINE HCL 25 MG PO TABS
25.0000 mg | ORAL_TABLET | Freq: Four times a day (QID) | ORAL | Status: DC | PRN
  Administered 2013-06-21: 25 mg via ORAL
  Filled 2013-06-21 (×2): qty 1

## 2013-06-21 MED ORDER — ONDANSETRON 4 MG PO TBDP
4.0000 mg | ORAL_TABLET | Freq: Four times a day (QID) | ORAL | Status: AC | PRN
  Administered 2013-06-21: 4 mg via ORAL
  Filled 2013-06-21: qty 1

## 2013-06-21 MED ORDER — CHLORDIAZEPOXIDE HCL 25 MG PO CAPS
25.0000 mg | ORAL_CAPSULE | Freq: Four times a day (QID) | ORAL | Status: AC | PRN
  Administered 2013-06-21: 25 mg via ORAL
  Filled 2013-06-21 (×2): qty 1

## 2013-06-21 MED ORDER — ACETAMINOPHEN 325 MG PO TABS
650.0000 mg | ORAL_TABLET | Freq: Four times a day (QID) | ORAL | Status: DC | PRN
  Administered 2013-06-21 – 2013-06-27 (×4): 650 mg via ORAL
  Filled 2013-06-21 (×5): qty 2
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
## 2013-06-21 NOTE — Progress Notes (Signed)
Adult Psychoeducational Group Note  Date:  06/21/2013 Time:  11:21 AM  Group Topic/Focus:  Stages of Change:   The focus of this group is to explain the stages of change and help patients identify changes they want to make upon discharge.  Participation Level:  Active  Participation Quality:  Appropriate and Attentive  Affect:  Appropriate  Cognitive:  Appropriate  Insight: Appropriate  Engagement in Group:  Engaged  Modes of Intervention:  Discussion and Education   Elijio Miles 06/21/2013, 11:21 AM

## 2013-06-21 NOTE — Progress Notes (Signed)
D) Pt was started on a 1:1 at noon today due to increased suicidal ideation and and unable to contract for safety. Pt. States that she is tired of fighting and just wants to die. States she fights and fights with social services to get food on the table to feed her 4 children and she just cannot fight anymore. Attempted other ways of making money on the street and she cannot deal with the quilt of that. Lying in her room rocking back and forth going over and over what she did in her head and crying. A) Pt was placed on a 1:1 for her safety. Given prn medications to help with her anxiety. Provided with a verbal 1:1 with this Clinical research associate. Offer. R) Pt is unable to contract for safety and is presently on a 1:1.

## 2013-06-21 NOTE — BH Assessment (Signed)
Consulted with EDP Dr.Opitz To obtain clinicals prior to assessing patient, Dr.Opitz reports that he does not know anything about the patient and that i can refer to Dr. Dellie Burns note.  Glorious Peach, MS, LCASA Assessment Counselor
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
## 2013-06-21 NOTE — [facility] Group Notes (Signed)
[facility] LCSW Group Therapy  Feelings Around Relapse 1:15 -2:30        06/21/2013   Type of Therapy:  Group Therapy  Participation Level: Patient did not attend group.   Wynn Banker 06/21/2013
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
## 2013-06-21 NOTE — Clinical Social Work Note (Signed)
Writer attempted to meet with patient to complete PSA.  Patient stated she was not ready to answer any questions.  Will follow up with patient later.

## 2013-06-21 NOTE — Tx Team (Signed)
Initial Interdisciplinary Treatment Plan  PATIENT STRENGTHS: (choose at least two) Average or above average intelligence General fund of knowledge  PATIENT STRESSORS: Loss of mother Financial difficulties    PROBLEM LIST: Problem List/Patient Goals Date to be addressed Date deferred Reason deferred Estimated date of resolution  SI 12/12     Depression      Anxiety      ETOH abuse 2-3 40oz daily                                     DISCHARGE CRITERIA:  Improved stabilization in mood, thinking, and/or behavior Medical problems require only outpatient monitoring Motivation to continue treatment in a less acute level of care Need for constant or close observation no longer present Verbal commitment to aftercare and medication compliance  PRELIMINARY DISCHARGE PLAN: Attend PHP/IOP Attend 12-step recovery group  PATIENT/FAMIILY INVOLVEMENT: This treatment plan has been presented to and reviewed with the patient, [name].  The patient and family have been given the opportunity to ask questions and make suggestions.  [name] A 06/21/2013, 7:28 AM

## 2013-06-21 NOTE — Progress Notes (Signed)
1:1 Note at 1600 D) Pt has remained on her 1:1. She is lying on the bed with her eyes closed and appears to be asleep. Breathing is even and unlabored. Pt continues to have a 1:1. A) 1:1 sitter remains with Pt. Given support when awake R) Pt appears to be asleep.

## 2013-06-21 NOTE — ED Notes (Signed)
Telepsych done at bedside. Pt cooperative with staff, but is drowsy.

## 2013-06-21 NOTE — Progress Notes (Signed)
1:1 note @ 2000 D) Pt was lying in her bed at 1915 and became nauseated and vomited. States that she still has a headache and feels quite anxious. Pt given a prn of Librium 25 mg and Zofran 4 mg sublingual. Continues to have thoughts of suicide and feels that she cannot be safe. A) Given support and reassurance. PRN medications given to Pt for her increased anxiety and nausea. Remains on the 1:1 for her safety. R) Continues to verbalize that she is not safe and cannot contract for her safety.

## 2013-06-21 NOTE — Progress Notes (Signed)
Nutrition Brief   Patient identified on the Malnutrition Screening Tool (MST) Report.  Wt Readings from Last 10 Encounters:  06/21/13 149 lb (67.586 kg)  06/17/13 153 lb (69.4 kg)  06/03/13 155 lb (70.308 kg)   Body mass index is 25.19 kg/(m^2). Patient meets criteria for overweight based on current BMI.   Discussed intake PTA with patient and compared to intake presently.  Discussed changes in intake, if any, and encouraged adequate intake of meals and snacks. Pt reports not eating well for the past few weeks r/t not being hungry. Has lost 4 pounds in the past week. Appetite improving some during admission. Not interested in nutritional supplements. Pt with minimal eye contact.   Current diet order is regular and pt is also offered choice of unit snacks mid-morning and mid-afternoon.  Pt is eating as desired.   Labs and medications reviewed.   Nutrition Dx:  Unintended wt change r/t suboptimal oral intake AEB pt report  Interventions:   Discussed the importance of nutrition and encouraged intake of food and beverages.     No additional nutrition interventions warranted at this time. If nutrition issues arise, please consult RD.   Levon Hedger MS, RD, LDN [phone] Pager [phone] After Hours Pager
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
## 2013-06-21 NOTE — ED Notes (Signed)
Pt states her children are home alone but are safe.

## 2013-06-21 NOTE — [facility] Group Notes (Signed)
[facility] LCSW Aftercare Discharge Planning Group Note   06/21/2013 12:55 PM  Participation Quality:  Patient advised of not being ready to answer questions.   [name], [name]

## 2013-06-22 DIAGNOSIS — F1920 Other psychoactive substance dependence, uncomplicated: Secondary | ICD-10-CM | POA: Diagnosis present

## 2013-06-22 DIAGNOSIS — F1010 Alcohol abuse, uncomplicated: Secondary | ICD-10-CM

## 2013-06-22 DIAGNOSIS — F3130 Bipolar disorder, current episode depressed, mild or moderate severity, unspecified: Secondary | ICD-10-CM | POA: Diagnosis present

## 2013-06-22 DIAGNOSIS — F29 Unspecified psychosis not due to a substance or known physiological condition: Secondary | ICD-10-CM | POA: Diagnosis present

## 2013-06-22 DIAGNOSIS — F1020 Alcohol dependence, uncomplicated: Secondary | ICD-10-CM | POA: Diagnosis present

## 2013-06-22 MED ORDER — QUETIAPINE FUMARATE 50 MG PO TABS
50.0000 mg | ORAL_TABLET | Freq: Once | ORAL | Status: AC | PRN
  Administered 2013-06-22: 50 mg via ORAL
  Filled 2013-06-22: qty 1

## 2013-06-22 MED ORDER — LAMOTRIGINE 25 MG PO TABS
25.0000 mg | ORAL_TABLET | Freq: Every day | ORAL | Status: DC
  Administered 2013-06-22 – 2013-06-24 (×3): 25 mg via ORAL
  Filled 2013-06-22 (×5): qty 1

## 2013-06-22 MED ORDER — TRAZODONE HCL 100 MG PO TABS
100.0000 mg | ORAL_TABLET | Freq: Every day | ORAL | Status: DC
  Administered 2013-06-22 – 2013-06-30 (×9): 100 mg via ORAL
  Filled 2013-06-22 (×13): qty 1

## 2013-06-22 MED ORDER — METOPROLOL TARTRATE 25 MG PO TABS
25.0000 mg | ORAL_TABLET | Freq: Every day | ORAL | Status: DC
  Administered 2013-06-23 – 2013-07-01 (×9): 25 mg via ORAL
  Filled 2013-06-22 (×11): qty 1

## 2013-06-22 MED ORDER — LAMOTRIGINE 25 MG PO TABS
ORAL_TABLET | ORAL | Status: AC
  Administered 2013-06-22: 25 mg
  Filled 2013-06-22: qty 1

## 2013-06-22 MED ORDER — QUETIAPINE FUMARATE ER 50 MG PO TB24
50.0000 mg | ORAL_TABLET | Freq: Once | ORAL | Status: AC
  Administered 2013-06-22: 50 mg via ORAL
  Filled 2013-06-22: qty 1
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
## 2013-06-22 NOTE — Progress Notes (Signed)
1:1 Note  D) Pt has attended the groups this morning. Will talk and volunteer some in the group. Associating with her peers appropriately and getting some support from them. Continues to have thoughts of SI. Unable to contract for her safety while on the unit. Affect remains flat and mood is depressed. A) Given support and reassurance along with appropriate praise. Encouraged to verbalize her feelings in her 1:1's. R) Continues to have thoughts of SI.

## 2013-06-22 NOTE — Progress Notes (Signed)
[facility] Group Notes:  (Nursing/MHT/Case Management/Adjunct)  Date:  06/22/2013  Time:  10:46 PM  Type of Therapy:  Group Therapy  Participation Level:  Active  Participation Quality:  Appropriate  Affect:  Appropriate  Cognitive:  Appropriate  Insight:  Appropriate  Engagement in Group:  Engaged  Modes of Intervention:  Discussion  Summary of Progress/Problems:The patient was very short in her meaningful answer. She said that "there is hope" and that what she learn from the early group.  Octavio Manns 06/22/2013, 10:46 PM

## 2013-06-22 NOTE — Progress Notes (Signed)
!:  1 Note D) Pt has been in her room with her 1:1. Affect is flat and sad. Mood depressed. Continues to have thoughts of active SI and cannot contract for her safety. Pt verbalizes feelings of increased anxiety and is set on hurting herself.  A) Given support and reassurance. Remains of a 1:1 for her safety R) Continues to have thoughts of SI and cannot contract for her safety

## 2013-06-22 NOTE — Progress Notes (Signed)
Patient ID: [name], female   DOB: 23-Jul-1970, 42 y.o.   MRN: [id number]  1:1 Note:   D: Patient pleasant and cooperative, denies SI. No s/s of distress noted.  A: Q 15 minute safety checks, encourage staff/peer interaction and group participation. Administer medications as ordered by MD. R: Patient compliant with medications and participated in group session.

## 2013-06-22 NOTE — [facility] Group Notes (Signed)
[facility] Group Notes: (Clinical Social Work)   06/22/2013      Type of Therapy:  Group Therapy   Participation Level:  Did Not Attend - arrived for last 5 minutes of group only.   Ambrose Mantle, LCSW 06/22/2013, 5:24 PM

## 2013-06-22 NOTE — Progress Notes (Signed)
Psychoeducational Group Note  Date: 06/22/2013 Time:  1015  Group Topic/Focus:  Identifying Needs:   The focus of this group is to help patients identify their personal needs that have been historically problematic and identify healthy behaviors to address their needs.  Participation Level:  Active  Participation Quality:  Appropriate  Affect:  Appropriate  Cognitive:  Appropriate  Insight:  Engaged  Engagement in Group:  Engaged  Additional Comments:    [name] A  

## 2013-06-22 NOTE — Progress Notes (Signed)
Nursing 1:1 note D:Pt observed sleeping in bed with eyes closed. RR even and unlabored. No distress noted. A: 1:1 observation continues for safety  R: pt remains safe  

## 2013-06-22 NOTE — Progress Notes (Signed)
Nursing 1:1 note D:Pt observed up in bed talking to roommate. RR even and unlabored. No distress noted. A: 1:1 observation continues for safety  R: pt remains safe

## 2013-06-22 NOTE — Progress Notes (Signed)
Pt seems to get upset with Clinical research associate, pt had a attitude with Clinical research associate. Pt stated she has been having a headache all day. Pt stated that there were too many things to talk about earlier than the pt head. Writer tried to assess pt, but pt stated Clinical research associate was badgering her and that Clinical research associate needed anger management.

## 2013-06-22 NOTE — Progress Notes (Signed)
 .  Psychoeducational Group Note    Date: 06/22/2013 Time: 0930  Goal Setting Purpose of Group: To be able to set a goal that is measurable and that can be accomplished in one day Participation Level:  Active  Participation Quality:  Appropriate  Affect:  Appropriate  Cognitive:  Oriented  Insight:  Improving  Engagement in Group:  Engaged  Additional Comments:    Asbury Hair A 

## 2013-06-22 NOTE — Progress Notes (Signed)
[facility] MD Progress Note  06/22/2013 12:46 PM [name]  MRN:  [id number] Subjective:  Patient stated she was not suppose to be here, "suppose to be in heaven", did not sleep well--Trazodone ordered, very tearful on assessment--feels her kids would be better off if she were dead.  She continues to hear voices that tell her to kill herself and where to go after discharge to accomplish it.  [name] stated she was self medicating with drugs to help her depression and voices.  When asked about her appetite, she stated she has been bulimic her whole life--poor appetite.  Patient ordered Seroquel for her agitation and voices. Diagnosis:   DSM5:  Substance/Addictive Disorders:  Alcohol Related Disorder - Severe (303.90), Alcohol Intoxication with Use Disorder - Severe (F10.229), Alcohol Withdrawal (291.81), Cannabis Use Disorder - Severe (304.30) and Opioid Disorder - Moderate (304.00)  Axis I: Alcohol Abuse, Anxiety Disorder NOS, Bipolar, Depressed, Psychotic Disorder NOS and Substance Abuse Axis II: Deferred Axis III:  Past Medical History  Diagnosis Date  . Hypertension   . Leg pain   . Substance abuse   . Bipolar 1 disorder   . Depression   . Psychosis   . Carpal tunnel syndrome    Axis IV: economic problems, housing problems, other psychosocial or environmental problems, problems related to social environment and problems with primary support group Axis V: 41-50 serious symptoms  ADL's:  Intact  Sleep: Poor  Appetite:  Poor  Suicidal Ideation:  Plan:  vague Intent:  yes Means:  none Homicidal Ideation:  Denies   Psychiatric Specialty Exam: Review of Systems  Constitutional: Negative.   HENT: Negative.   Eyes: Negative.   Respiratory: Negative.   Cardiovascular: Negative.   Gastrointestinal: Negative.   Genitourinary: Negative.   Musculoskeletal: Negative.   Skin: Negative.   Neurological: Negative.   Endo/Heme/Allergies: Negative.   Psychiatric/Behavioral:  Positive for depression, suicidal ideas, hallucinations and substance abuse. The patient is nervous/anxious and has insomnia.     Blood pressure 123/85, pulse 112, temperature 98.6 F (37 C), temperature source Oral, resp. rate 16, height 5' 4.5" (1.638 m), weight 67.586 kg (149 lb), last menstrual period 05/27/2013.Body mass index is 25.19 kg/(m^2).  General Appearance: Casual  Eye Contact::  Fair  Speech:  Normal Rate  Volume:  Normal  Mood:  Anxious and Depressed  Affect:  Congruent  Thought Process:  Coherent  Orientation:  Full (Time, Place, and Person)  Thought Content:  Hallucinations: Auditory  Suicidal Thoughts:  Yes.  with intent/plan  Homicidal Thoughts:  No  Memory:  Immediate;   Fair Recent;   Fair Remote;   Fair  Judgement:  Impaired  Insight:  Fair  Psychomotor Activity:  Decreased  Concentration:  Fair  Recall:  Fair  Akathisia:  No  Handed:  Right  AIMS (if indicated):     Assets:  Physical Health Resilience  Sleep:  Number of Hours: 4.5   Current Medications: Current Facility-Administered Medications  Medication Dose Route Frequency Provider Last Rate Last Dose  . acetaminophen (TYLENOL) tablet 650 mg  650 mg Oral Q6H PRN Regina Sox, MD   650 mg at 06/22/13 0239  . alum & mag hydroxide-simeth (MAALOX/MYLANTA) 200-200-20 MG/5ML suspension 30 mL  30 mL Oral Q4H PRN Regina Sox, MD      . chlordiazePOXIDE (LIBRIUM) capsule 25 mg  25 mg Oral Q6H PRN Regina Spurr, PA-C   25 mg at 06/21/13 2017  . chlordiazePOXIDE (LIBRIUM) capsule 25 mg  25 mg  Oral QID Regina Spurr, PA-C   25 mg at 06/22/13 1159   Followed by  . [START ON 06/23/2013] chlordiazePOXIDE (LIBRIUM) capsule 25 mg  25 mg Oral TID Regina Spurr, PA-C       Followed by  . [START ON 06/24/2013] chlordiazePOXIDE (LIBRIUM) capsule 25 mg  25 mg Oral BH-qamhs Regina Spurr, PA-C       Followed by  . [START ON 06/25/2013] chlordiazePOXIDE (LIBRIUM) capsule 25 mg  25 mg Oral Daily Regina Spurr,  PA-C      . hydrOXYzine (ATARAX/VISTARIL) tablet 25 mg  25 mg Oral Q6H PRN Regina Spurr, PA-C      . loperamide (IMODIUM) capsule 2-4 mg  2-4 mg Oral PRN Regina Spurr, PA-C      . magnesium hydroxide (MILK OF MAGNESIA) suspension 30 mL  30 mL Oral Daily PRN Regina Sox, MD      . multivitamin with minerals tablet 1 tablet  1 tablet Oral Daily Regina Spurr, PA-C   1 tablet at 06/22/13 0820  . ondansetron (ZOFRAN-ODT) disintegrating tablet 4 mg  4 mg Oral Q6H PRN Regina Spurr, PA-C   4 mg at 06/21/13 2018  . QUEtiapine (SEROQUEL) tablet 50 mg  50 mg Oral BID Regina Settle, MD      . thiamine (VITAMIN B-1) tablet 100 mg  100 mg Oral Daily Regina Spurr, PA-C   100 mg at 06/22/13 0820  . traZODone (DESYREL) tablet 50 mg  50 mg Oral QHS PRN Regina Mans, NP   50 mg at 06/21/13 2241    Lab Results:  Results for orders placed during the Hughes encounter of 06/20/13 (from the past 48 hour(s))  CBC     Status: Abnormal   Collection Time    06/20/13  9:27 PM      Result Value Range   WBC 4.3  4.0 - 10.5 K/uL   RBC 3.92  3.87 - 5.11 MIL/uL   Hemoglobin 11.8 (*) 12.0 - 15.0 g/dL   HCT 16.1 (*) 09.6 - 04.5 %   MCV 85.7  78.0 - 100.0 fL   MCH 30.1  26.0 - 34.0 pg   MCHC 35.1  30.0 - 36.0 g/dL   RDW 40.9  81.1 - 91.4 %   Platelets 272  150 - 400 K/uL  COMPREHENSIVE METABOLIC PANEL     Status: Abnormal   Collection Time    06/20/13  9:27 PM      Result Value Range   Sodium 140  135 - 145 mEq/L   Potassium 4.2  3.5 - 5.1 mEq/L   Chloride 108  96 - 112 mEq/L   CO2 22  19 - 32 mEq/L   Glucose, Bld 81  70 - 99 mg/dL   BUN 5 (*) 6 - 23 mg/dL   Creatinine, Ser 7.82  0.50 - 1.10 mg/dL   Calcium 8.8  8.4 - 95.6 mg/dL   Total Protein 7.4  6.0 - 8.3 g/dL   Albumin 3.3 (*) 3.5 - 5.2 g/dL   AST 29  0 - 37 U/L   ALT 12  0 - 35 U/L   Alkaline Phosphatase 41  39 - 117 U/L   Total Bilirubin 0.6  0.3 - 1.2 mg/dL   GFR calc non Af Amer >90  >90 mL/min   GFR calc Af Amer >90  >90  mL/min   Comment: (NOTE)     The eGFR has been calculated using the CKD EPI equation.  This calculation has not been validated in all clinical situations.     eGFR's persistently <90 mL/min signify possible Chronic Kidney     Disease.  ETHANOL     Status: Abnormal   Collection Time    06/20/13  9:27 PM      Result Value Range   Alcohol, Ethyl (B) 96 (*) 0 - 11 mg/dL   Comment:            LOWEST DETECTABLE LIMIT FOR     SERUM ALCOHOL IS 11 mg/dL     FOR MEDICAL PURPOSES ONLY  ACETAMINOPHEN LEVEL     Status: None   Collection Time    06/20/13  9:27 PM      Result Value Range   Acetaminophen (Tylenol), Serum <15.0  10 - 30 ug/mL   Comment:            THERAPEUTIC CONCENTRATIONS VARY     SIGNIFICANTLY. A RANGE OF 10-30     ug/mL MAY BE AN EFFECTIVE     CONCENTRATION FOR MANY PATIENTS.     HOWEVER, SOME ARE BEST TREATED     AT CONCENTRATIONS OUTSIDE THIS     RANGE.     ACETAMINOPHEN CONCENTRATIONS     >150 ug/mL AT 4 HOURS AFTER     INGESTION AND >50 ug/mL AT 12     HOURS AFTER INGESTION ARE     OFTEN ASSOCIATED WITH TOXIC     REACTIONS.  SALICYLATE LEVEL     Status: Abnormal   Collection Time    06/20/13  9:27 PM      Result Value Range   Salicylate Lvl <2.0 (*) 2.8 - 20.0 mg/dL  LITHIUM LEVEL     Status: Abnormal   Collection Time    06/21/13 12:39 AM      Result Value Range   Lithium Lvl <0.25 (*) 0.80 - 1.40 mEq/L    Physical Findings: AIMS: Facial and Oral Movements Muscles of Facial Expression: None, normal Lips and Perioral Area: None, normal Jaw: None, normal Tongue: None, normal,Extremity Movements Upper (arms, wrists, hands, fingers): None, normal Lower (legs, knees, ankles, toes): None, normal, Trunk Movements Neck, shoulders, hips: None, normal, Overall Severity Severity of abnormal movements (highest score from questions above): None, normal Incapacitation due to abnormal movements: None, normal Patient's awareness of abnormal movements (rate only  patient's report): No Awareness, Dental Status Current problems with teeth and/or dentures?: No Does patient usually wear dentures?: No  CIWA:    COWS:     Treatment Plan Summary: Daily contact with patient to assess and evaluate symptoms and progress in treatment Medication management  Plan:  Review of chart, vital signs, medications, and notes. 1-Individual and group therapy 2-Medication management for depression, psychosis, and anxiety:  Medications reviewed with the patient and Trazodone ordered along with Lamictal 3-Coping skills for depression, anxiety, and psychosis 4-Continue crisis stabilization and management 5-Address health issues--monitoring vital signs, stable 6-Treatment plan in progress to prevent relapse of depression, psychosis, and anxiety  Medical Decision Making Problem Points:  Established problem, stable/improving (1) and Review of psycho-social stressors (1) Data Points:  Review of medication regiment & side effects (2)  I certify that inpatient services furnished can reasonably be expected to improve the patient's condition.   Nanine Means, PMH-NP 06/22/2013, 12:46 PM

## 2013-06-22 NOTE — Progress Notes (Signed)
1:1 Note  D) Pt rates her depression and hopelessness both at a 10 and continues to have thoughts of SI and is unable to contract for her safety. Pt states she is hearing voices that tell her to kill herself. Becomes tearful when talking about them. Pt states that she is very angry and wants to learn how to deal with it. A) Pt has remained on the 1:1 all day for her safety. Has been able to attend most of the groups, take a shower and participate in the milieu.  R) Pt remains on a 1:1 for her safety.

## 2013-06-23 MED ORDER — QUETIAPINE FUMARATE 50 MG PO TABS
50.0000 mg | ORAL_TABLET | Freq: Three times a day (TID) | ORAL | Status: DC
  Administered 2013-06-23 – 2013-06-24 (×4): 50 mg via ORAL
  Filled 2013-06-23 (×10): qty 1

## 2013-06-23 NOTE — Progress Notes (Signed)
[facility] MD Progress Note  06/23/2013 9:46 AM [name]  MRN:  [id number] Subjective:  Patient improved greatly today, not tearful, states she slept well and is using distractions to redirect herself from her negative, auditory hallucinations.  Her one-one sitter was discontinued since she contracts for safety, agrees to go to staff if she feels like acting on her suicidal ideations or if they increase.  Appetite is poor, attending groups and states she is talking about the "painful things." Diagnosis:   DSM5:  Substance/Addictive Disorders:  Alcohol Related Disorder - Moderate (303.90), Cannabis Use Disorder - Severe (304.30) and Opioid Disorder - Moderate (304.00)  Axis I: Asperger's Disorder, Bipolar, Depressed, Psychotic Disorder NOS, Substance Abuse and Substance Induced Mood Disorder Axis II: Deferred Axis III:  Past Medical History  Diagnosis Date  . Hypertension   . Leg pain   . Substance abuse   . Bipolar 1 disorder   . Depression   . Psychosis   . Carpal tunnel syndrome    Axis IV: economic problems, housing problems, other psychosocial or environmental problems, problems related to social environment and problems with primary support group Axis V: 41-50 serious symptoms  ADL's:  Intact  Sleep: Good  Appetite:  Poor  Suicidal Ideation:  Plan:  vague Intent:  none Means:  none Homicidal Ideation:  Denies   Psychiatric Specialty Exam: Review of Systems  HENT: Negative.   Eyes: Negative.   Respiratory: Negative.   Cardiovascular: Negative.   Gastrointestinal: Negative.   Genitourinary: Negative.   Musculoskeletal: Negative.   Skin: Negative.   Neurological: Negative.   Endo/Heme/Allergies: Negative.   Psychiatric/Behavioral: Positive for depression, suicidal ideas, hallucinations and substance abuse. The patient is nervous/anxious.     Blood pressure 106/69, pulse 94, temperature 97.7 F (36.5 C), temperature source Oral, resp. rate 16, height 5' 4.5"  (1.638 m), weight 67.586 kg (149 lb), last menstrual period 05/27/2013.Body mass index is 25.19 kg/(m^2).  General Appearance: Casual  Eye Contact::  Fair  Speech:  Normal Rate  Volume:  Normal  Mood:  Anxious and Depressed  Affect:  Congruent  Thought Process:  Coherent  Orientation:  Full (Time, Place, and Person)  Thought Content:  Hallucinations: Auditory  Suicidal Thoughts:  Yes.  without intent/plan  Homicidal Thoughts:  No  Memory:  Immediate;   Fair Recent;   Fair Remote;   Fair  Judgement:  Fair  Insight:  Fair  Psychomotor Activity:  Decreased  Concentration:  Fair  Recall:  Fair  Akathisia:  No  Handed:  Right  AIMS (if indicated):     Assets:  Leisure Time Physical Health Resilience Social Support  Sleep:  Number of Hours: 6.5   Current Medications: Current Facility-Administered Medications  Medication Dose Route Frequency Provider Last Rate Last Dose  . acetaminophen (TYLENOL) tablet 650 mg  650 mg Oral Q6H PRN Larena Sox, MD   650 mg at 06/22/13 0239  . alum & mag hydroxide-simeth (MAALOX/MYLANTA) 200-200-20 MG/5ML suspension 30 mL  30 mL Oral Q4H PRN Larena Sox, MD      . chlordiazePOXIDE (LIBRIUM) capsule 25 mg  25 mg Oral Q6H PRN Verne Spurr, PA-C   25 mg at 06/21/13 2017  . chlordiazePOXIDE (LIBRIUM) capsule 25 mg  25 mg Oral TID Verne Spurr, PA-C   25 mg at 06/23/13 2130   Followed by  . [START ON 06/24/2013] chlordiazePOXIDE (LIBRIUM) capsule 25 mg  25 mg Oral BH-qamhs Verne Spurr, PA-C       Followed  by  . Melene Muller ON 06/25/2013] chlordiazePOXIDE (LIBRIUM) capsule 25 mg  25 mg Oral Daily Verne Spurr, PA-C      . hydrOXYzine (ATARAX/VISTARIL) tablet 25 mg  25 mg Oral Q6H PRN Verne Spurr, PA-C      . lamoTRIgine (LAMICTAL) tablet 25 mg  25 mg Oral Daily Nanine Means, NP   25 mg at 06/23/13 0829  . loperamide (IMODIUM) capsule 2-4 mg  2-4 mg Oral PRN Verne Spurr, PA-C      . magnesium hydroxide (MILK OF MAGNESIA) suspension 30 mL  30  mL Oral Daily PRN Larena Sox, MD      . metoprolol tartrate (LOPRESSOR) tablet 25 mg  25 mg Oral QAC breakfast Nanine Means, NP   25 mg at 06/23/13 4696  . multivitamin with minerals tablet 1 tablet  1 tablet Oral Daily Verne Spurr, PA-C   1 tablet at 06/23/13 2952  . ondansetron (ZOFRAN-ODT) disintegrating tablet 4 mg  4 mg Oral Q6H PRN Verne Spurr, PA-C   4 mg at 06/21/13 2018  . QUEtiapine (SEROQUEL) tablet 50 mg  50 mg Oral BID Nanine Means, NP   50 mg at 06/23/13 0830  . thiamine (VITAMIN B-1) tablet 100 mg  100 mg Oral Daily Verne Spurr, PA-C   100 mg at 06/23/13 0831  . traZODone (DESYREL) tablet 100 mg  100 mg Oral QHS Nanine Means, NP   100 mg at 06/22/13 2052    Lab Results: No results found for this or any previous visit (from the past 48 hour(s)).  Physical Findings: AIMS: Facial and Oral Movements Muscles of Facial Expression: None, normal Lips and Perioral Area: None, normal Jaw: None, normal Tongue: None, normal,Extremity Movements Upper (arms, wrists, hands, fingers): None, normal Lower (legs, knees, ankles, toes): None, normal, Trunk Movements Neck, shoulders, hips: None, normal, Overall Severity Severity of abnormal movements (highest score from questions above): None, normal Incapacitation due to abnormal movements: None, normal Patient's awareness of abnormal movements (rate only patient's report): No Awareness, Dental Status Current problems with teeth and/or dentures?: No Does patient usually wear dentures?: No  CIWA:    COWS:     Treatment Plan Summary: Daily contact with patient to assess and evaluate symptoms and progress in treatment Medication management  Plan:  Review of chart, vital signs, medications, and notes. 1-Individual and group therapy 2-Medication management for depression and anxiety:  Medications reviewed with the patient and she stated she stated no untoward effects, no changes made 3-Coping skills for depression, anxiety, and  substance abuse 4-Continue crisis stabilization and management 5-Address health issues--monitoring vital signs, stable 6-Treatment plan in progress to prevent relapse of depression, substance use, and anxiety  Medical Decision Making Problem Points:  Established problem, stable/improving (1) and Review of psycho-social stressors (1) Data Points:  Review of medication regiment & side effects (2)  I certify that inpatient services furnished can reasonably be expected to improve the patient's condition.   Nanine Means, PMH-NP 06/23/2013, 9:46 AM

## 2013-06-23 NOTE — Progress Notes (Signed)
Nursing 1:1 note D:Pt observed sleeping in bed with eyes closed. RR even and unlabored. No distress noted. A: 1:1 observation continues for safety  R: pt remains safe  

## 2013-06-23 NOTE — Progress Notes (Signed)
Psychoeducational Group Note  Date: 06/23/2013 Time:0930  Group Topic/Focus:  Gratefulness:  The focus of this group is to help patients identify what two things they are most grateful for in their lives. What helps ground them and to center them on their work to their recovery.  Participation Level:  Active  Participation Quality:  Appropriate  Affect:  Appropriate  Cognitive:  Appropriate  Insight:  Improving  Engagement in Group:  Engaged  Additional Comments:    Alayssa Flinchum A   

## 2013-06-23 NOTE — Progress Notes (Signed)
Psychoeducational Group Note  Date:  06/23/2013 Time:  1015  Group Topic/Focus:  Making Healthy Choices:   The focus of this group is to help patients identify negative/unhealthy choices they were using prior to admission and identify positive/healthier coping strategies to replace them upon discharge.  Participation Level:  Active  Participation Quality:  Appropriate  Affect:  Appropriate  Cognitive:  Oriented  Insight:  Improving  Engagement in Group:  Engaged  Additional Comments:    Caz Weaver A 06/23/2013  

## 2013-06-23 NOTE — [facility] Group Notes (Signed)
[facility] Group Notes: (Clinical Social Work)   06/23/2013      Type of Therapy:  Group Therapy   Participation Level:  Did Not Attend    Jewels Langone Grossman-Orr, LCSW 06/23/2013, 4:32 PM     

## 2013-06-23 NOTE — Progress Notes (Signed)
Patient ID: [name], female   DOB: 09-08-1970, 42 y.o.   MRN: [id number]  D: Patient pleasant and with brighter affect; pt's mood improving. Pt interacting well with staff/peers. A: Q 15 minute safety checks, encourage staff/peer interaction, group participation and medication compliance. R: Patient denies SI/HI at this time. No distress noted.

## 2013-06-23 NOTE — Progress Notes (Signed)
1:1 NOTE  D) Pt is up and out of bed and in the hall with her 1:1. Affect is a bit more spontaneous this morning and mood a bit more pleasant. Eye contact is improved. Pt continues to admit to thoughts of killing herself and continues to be unable to contract for her safety. On her self inventory writes that she is in constant thought of SI. Continues to hear voices that are telling her to kill herself. Pt verbalizes being ashamed over selling herself for money. A) Given support and reassurance. Focusing on her strengths. Praising her for ability to come out of the room and interact in the dayroom. R) Pt. Continues to have thoughts of SI Constantly. Remains on the 1:1 for her safety

## 2013-06-23 NOTE — Progress Notes (Signed)
D) Pt has done well since coming off the 1:1. Affect and mood are appropriate. Denies SI presently, and denies auditory hallucinations.Getting along well in the milieu and at times is laughing and joking. Stated today that she will never again sell herself for money. Remains sad about her choice to participate in that. A) Provided with a 1:1. Given support, reassurance and praise. Verbal contract for Pt's safety obtained from Pt.  R) Continuing to monitor Pt. Denies SI

## 2013-06-23 NOTE — Progress Notes (Addendum)
Patient ID: [name], female   DOB: [birth year], 42 y.o.   MRN: [id number]  Nursing 1:1 note D:Pt observed sleeping in bed with eyes closed. RR even and unlabored. No distress noted A: 1:1 observation continues for safety  R: pt remains safe

## 2013-06-23 NOTE — Progress Notes (Addendum)
Adult Psychoeducational Group Note  Date:  06/23/2013 Time:  9:37 PM  Group Topic/Focus:  Wrap-Up Group:   The focus of this group is to help patients review their daily goal of treatment and discuss progress on daily workbooks.  Participation Level:  Minimal  Participation Quality:  Drowsy and Inattentive  Affect:  Lethargic  Cognitive:  Appropriate  Insight: None  Engagement in Group:  None  Modes of Intervention:  Discussion and Support  Elijio Miles 06/23/2013, 9:37 PM

## 2013-06-24 MED ORDER — LAMOTRIGINE 25 MG PO TABS
50.0000 mg | ORAL_TABLET | Freq: Every day | ORAL | Status: DC
  Filled 2013-06-24: qty 2

## 2013-06-24 MED ORDER — QUETIAPINE FUMARATE 100 MG PO TABS
100.0000 mg | ORAL_TABLET | Freq: Two times a day (BID) | ORAL | Status: DC
  Administered 2013-06-25 – 2013-07-01 (×13): 100 mg via ORAL
  Filled 2013-06-24 (×17): qty 1

## 2013-06-24 MED ORDER — QUETIAPINE FUMARATE 25 MG PO TABS
25.0000 mg | ORAL_TABLET | Freq: Three times a day (TID) | ORAL | Status: DC
  Filled 2013-06-24 (×6): qty 1

## 2013-06-24 NOTE — Tx Team (Signed)
Interdisciplinary Treatment Plan Update   Date Reviewed:  06/24/2013  Time Reviewed:  9:48 AM  Progress in Treatment:   Attending groups: Yes Participating in groups: Yes Taking medication as prescribed: Yes  Tolerating medication: Yes Family/Significant other contact made: No, but will ask patient for consent for collateral contact Patient understands diagnosis: Yes  Discussing patient identified problems/goals with staff: Yes Medical problems stabilized or resolved: Yes Denies suicidal/homicidal ideation: No, patient endorses HI but no SI Patient has not harmed self or others: Yes  For review of initial/current patient goals, please see plan of care.  Estimated Length of Stay:  5 days  Reasons for Continued Hospitalization:  Anxiety Depression Medication stabilization Homicidal Suicidal Suicidal deation  New Problems/Goals identified:    Discharge Plan or Barriers:   Home with outpatient follow up to be determined  Additional Comments:   MD to continue with medication stabilization  Attendees:  Patient:  06/24/2013 9:48 AM   Signature: Mervyn Gay, MD 06/24/2013 9:48 AM  Signature:  Verne Spurr, PA 06/24/2013 9:48 AM  Signature: Elliot Cousin, RN 06/24/2013 9:48 AM   Signature: Neill Loft, RN 06/24/2013 9:48 AM  Signature:  Onnie Boer, RN - [facility] 06/24/2013 9:48 AM  Signature:  Juline Patch, LCSW 06/24/2013 9:48 AM  Signature:  Reyes Ivan, LCSW 06/24/2013 9:48 AM  Signature:  Liz Malady, RN 06/24/2013 9:48 AM   06/24/2013 9:48 AM   06/24/2013  9:48 AM   06/24/2013  9:48 AM  Signature:  06/24/2013  9:48 AM    Scribe for Treatment Team:   Juline Patch,  06/24/2013 9:48 AM

## 2013-06-24 NOTE — Progress Notes (Signed)
Patient ID: [name], female   DOB: [birth year], 42 y.o.   MRN: [id number] [facility] MD Progress Note  06/24/2013 8:39 PM [name]  MRN:  [id number]  Subjective: [name] says she is about the same. She reports she continues to have SI while she is now able to contract for safety. She is attending groups at "almost 100%" and she denies issues with her medications. She feels both the groups and medications are helpful. Medication indications and education are done today. She notes that her depression is still a 9/10 and her anxiety is an 8/10.  Diagnosis:   DSM5:  Substance/Addictive Disorders:  Alcohol Related Disorder - Moderate (303.90), Cannabis Use Disorder - Severe (304.30) and Opioid Disorder - Moderate (304.00)  Axis I: Asperger's Disorder, Bipolar, Depressed, Psychotic Disorder NOS, Substance Abuse and Substance Induced Mood Disorder Axis II: Deferred Axis III:  Past Medical History  Diagnosis Date  . Hypertension   . Leg pain   . Substance abuse   . Bipolar 1 disorder   . Depression   . Psychosis   . Carpal tunnel syndrome    Axis IV: economic problems, housing problems, other psychosocial or environmental problems, problems related to social environment and problems with primary support group Axis V: 41-50 serious symptoms  ADL's:  Intact  Sleep: Good  Appetite:  Poor  Suicidal Ideation:  Plan:  vague Intent:  none Means:  none Homicidal Ideation:  Denies   Psychiatric Specialty Exam: Review of Systems  HENT: Negative.   Eyes: Negative.   Respiratory: Negative.   Cardiovascular: Negative.   Gastrointestinal: Negative.   Genitourinary: Negative.   Musculoskeletal: Negative.   Skin: Negative.   Neurological: Negative.   Endo/Heme/Allergies: Negative.   Psychiatric/Behavioral: Positive for depression, suicidal ideas, hallucinations and substance abuse. The patient is nervous/anxious.     Blood pressure 113/75, pulse 87, temperature 97.4 F (36.3  C), temperature source Oral, resp. rate 16, height 5' 4.5" (1.638 m), weight 67.586 kg (149 lb), last menstrual period 05/27/2013.Body mass index is 25.19 kg/(m^2).  General Appearance: Casual  Eye Contact::  Fair  Speech:  Normal Rate  Volume:  Normal  Mood:  Anxious and Depressed  Affect:  Congruent  Thought Process:  Coherent  Orientation:  Full (Time, Place, and Person)  Thought Content:  Hallucinations: Auditory  Suicidal Thoughts:  Yes.  without intent/plan  Homicidal Thoughts:  No  Memory:  Immediate;   Fair Recent;   Fair Remote;   Fair  Judgement:  Fair  Insight:  Fair  Psychomotor Activity:  Decreased  Concentration:  Fair  Recall:  Fair  Akathisia:  No  Handed:  Right  AIMS (if indicated):     Assets:  Leisure Time Physical Health Resilience Social Support  Sleep:  Number of Hours: 6.5   Current Medications: Current Facility-Administered Medications  Medication Dose Route Frequency Provider Last Rate Last Dose  . acetaminophen (TYLENOL) tablet 650 mg  650 mg Oral Q6H PRN Larena Sox, MD   650 mg at 06/22/13 0239  . alum & mag hydroxide-simeth (MAALOX/MYLANTA) 200-200-20 MG/5ML suspension 30 mL  30 mL Oral Q4H PRN Larena Sox, MD      . chlordiazePOXIDE (LIBRIUM) capsule 25 mg  25 mg Oral BH-qamhs Neil Mashburn, PA-C   25 mg at 06/24/13 0840   Followed by  . [START ON 06/25/2013] chlordiazePOXIDE (LIBRIUM) capsule 25 mg  25 mg Oral Daily Verne Spurr, PA-C      . [START ON 06/25/2013] lamoTRIgine (LAMICTAL) tablet  50 mg  50 mg Oral QHS Verne Spurr, PA-C      . magnesium hydroxide (MILK OF MAGNESIA) suspension 30 mL  30 mL Oral Daily PRN Larena Sox, MD   30 mL at 06/24/13 1832  . metoprolol tartrate (LOPRESSOR) tablet 25 mg  25 mg Oral QAC breakfast Nanine Means, NP   25 mg at 06/24/13 9147  . multivitamin with minerals tablet 1 tablet  1 tablet Oral Daily Verne Spurr, PA-C   1 tablet at 06/24/13 0840  . [START ON 06/25/2013] QUEtiapine  (SEROQUEL) tablet 100 mg  100 mg Oral BID Verne Spurr, PA-C      . thiamine (VITAMIN B-1) tablet 100 mg  100 mg Oral Daily Verne Spurr, PA-C   100 mg at 06/24/13 0840  . traZODone (DESYREL) tablet 100 mg  100 mg Oral QHS Nanine Means, NP   100 mg at 06/23/13 2114    Lab Results: No results found for this or any previous visit (from the past 48 hour(s)).  Physical Findings: AIMS: Facial and Oral Movements Muscles of Facial Expression: None, normal Lips and Perioral Area: None, normal Jaw: None, normal Tongue: None, normal,Extremity Movements Upper (arms, wrists, hands, fingers): None, normal Lower (legs, knees, ankles, toes): None, normal, Trunk Movements Neck, shoulders, hips: None, normal, Overall Severity Severity of abnormal movements (highest score from questions above): None, normal Incapacitation due to abnormal movements: None, normal Patient's awareness of abnormal movements (rate only patient's report): No Awareness, Dental Status Current problems with teeth and/or dentures?: No Does patient usually wear dentures?: No  CIWA:  CIWA-Ar Total: 4 COWS:     Treatment Plan Summary: Daily contact with patient to assess and evaluate symptoms and progress in treatment Medication management  Plan:  Review of chart, vital signs, medications, and notes. 1. Will increase the seroquel to 100mg  po BID to help with her intense anger issues. 2. Will continue to follow.  3. Disposition is in progress.   Medical Decision Making Problem Points:  Established problem, stable/improving (1) and Review of psycho-social stressors (1) Data Points:  Review of medication regiment & side effects (2)  I certify that inpatient services furnished can reasonably be expected to improve the patient's condition.   Rona Ravens. Mashburn RPAC 8:40 PM 06/24/2013  Reviewed the information documented and agree with the treatment plan.  Linwood Gullikson,JANARDHAHA R. 06/25/2013 12:53 PM

## 2013-06-24 NOTE — [facility] Group Notes (Signed)
[facility] LCSW Group Therapy          Overcoming Obstacles       1:15 -2:30        06/24/2013   4:30 PM     Type of Therapy:  Group Therapy  Participation Level:  Appropriate  Participation Quality:  Appropriate  Affect:  Appropriate, Alert  Cognitive:  Attentive Appropriate  Insight: Developing/Improving Engaged  Engagement in Therapy: Developing/Imprvoing Engaged  Modes of Intervention:  Discussion Exploration  Education Rapport BuildingProblem-Solving Support  Summary of Progress/Problems:  The main focus of today's group was overcoming obstacles.  She advised the obstacle she has to overcome is fear.  Patient able to identify appropriate coping skills.   [name] 06/24/2013    4:30 PM

## 2013-06-24 NOTE — Progress Notes (Signed)
D:  Per pt self inventory pt reports sleeping fair, appetite improving, energy level low, ability to pay attention improving, rates depression at a 9 out of 10 and hopelessness at a 10 out of 10, denies AVH, endorses Passive SI on and off, denies HI, patient revealed that she battles bulemia.  A:  Notified NP of pt's admission to bulemia, explained this to the patient, Emotional support provided, Encouraged pt to continue with treatment plan and attend all group activities, q15 min checks maintained for safety.  R:  Pt is receptive, calm and cooperative with patients and staff, going to groups, Np ordered nutrition consult and for pt to be observed x1 hour after all meals, patient made aware of this.

## 2013-06-24 NOTE — Progress Notes (Signed)
Recreation Therapy Notes  Date: 12.15.2014 Time: 3:00pm Location: [address] Dayroom   Group Topic: Communication, Team Building, Problem Solving  Goal Area(s) Addresses:  Patient will effectively work with peer towards shared goal.  Patient will identify skill used to make activity successful.  Patient will identify how skills used during activity can be used to reach post d/c goals.   Behavioral Response: Appropriate   Intervention: Problem Solving Activitiy  Activity: Life Boat. Patients were given a scenario about being on a sinking yacht. Patients were informed the yacht included 15 guest, 8 of which could be placed on the life boat, along with all group members. Individuals on guest list were of varying socioeconomic classes such as a Education officer, museum, Materials engineer, Midwife, Tree surgeon.   Education: Pharmacist, community, Discharge Planning    Education Outcome: Acknowledges understanding  Clinical Observations/Feedback: Patient with peers were slow to engage in activity, needing encouragement to participate in group session. Following encouragement patient actively engaged in group session, voicing her opinion and debating with peers appropriately. Patient made no contributions to group discussion, but appeared to actively listen as she maintained appropriate eye contact with speaker.   Marykay Lex Vincen Bejar, LRT/CTRS  Jearl Klinefelter 06/24/2013 4:17 PM

## 2013-06-24 NOTE — Clinical Social Work Note (Signed)
Patient advised of being open to have wrap around services through [facility].  She shared she has not been able to get a determination from DSS as to whether or not she qualifies for financial assistance and fears her electricity will be disconnected by the time is discharged from the hospital.

## 2013-06-24 NOTE — [facility] Group Notes (Signed)
[facility] LCSW Aftercare Discharge Planning Group Note   06/24/2013 4:29 PM    Participation Quality:  Appropraite  Mood/Affect:  Appropriate  Depression Rating:  10  Anxiety Rating:  10  Thoughts of Suicide:  Yes  Will you contract for safety?  Yes Current AVH:  No  Plan for Discharge/Comments:  Patient attended discharge planning group and actively participated in group. She advised of having gone to [place] but has not been seen by a MD at this time. CSW provided all participants with daily workbook.   Transportation Means: Patient has transportation.   Supports:  Patient has a support system.   Maclain Cohron, [name]

## 2013-06-25 MED ORDER — LAMOTRIGINE 100 MG PO TABS
100.0000 mg | ORAL_TABLET | Freq: Every day | ORAL | Status: DC
  Administered 2013-06-25: 100 mg via ORAL
  Filled 2013-06-25 (×3): qty 1

## 2013-06-25 NOTE — [facility] Suicide Risk Assessment (Signed)
[facility] INPATIENT:  Family/Significant Other Suicide Prevention Education  Suicide Prevention Education:  Education Completed;  Denise Washburn, Daughter, [phone]; has been identified by the patient as the family member/significant other with whom the patient will be residing, and identified as the person(s) who will aid the patient in the event of a mental health crisis (suicidal ideations/suicide attempt).  With written consent from the patient, the family member/significant other has been provided the following suicide prevention education, prior to the and/or following the discharge of the patient.  The suicide prevention education provided includes the following:  Suicide risk factors  Suicide prevention and interventions  National Suicide Hotline telephone number  [facility] assessment telephone number  [facility] Emergency Assistance 911  [facility] and/or Residential Mobile Crisis Unit telephone number  Request made of family/significant other to:  Remove weapons (e.g., guns, rifles, knives), all items previously/currently identified as safety concern.  Daughter advised patient does not have access to weapons.     Remove drugs/medications (over-the-counter, prescriptions, illicit drugs), all items previously/currently identified as a safety concern.  The family member/significant other verbalizes understanding of the suicide prevention education information provided.  The family member/significant other agrees to remove the items of safety concern listed above.  Wynn Banker 06/25/2013, 9:12 AM

## 2013-06-25 NOTE — Progress Notes (Signed)
orientThe focus of this group is to educate the patient on the purpose and policies of crisis stabilization and provide a format to answer questions about their admission.  The group details unit policies and expectations of patients while admitted. Came to group late, participated in exercised then cleaned up an ice spill then closed her eyes and appeared to sleep through rest of group.

## 2013-06-25 NOTE — Progress Notes (Signed)
Patient ID: [name], female   DOB: 03-Jul-1971, 42 y.o.   MRN: [id number] D Patient slept with requested medication.  Her appetite is improving.  Her energy level is low and her ability to pay attention is improving.  Her depression is rated 9/10 and her hopelessness is 10/10.  She reports she continuesto have thoughts of self harm. She does contract for safety.  She wrote on self inventory, "I don't think my existence is important. "A- Supported patient.  R- patient is attending most groups but sit through them with her eyes closed.

## 2013-06-25 NOTE — Progress Notes (Signed)
[facility] MD Progress Note  06/25/2013 1:33 PM [name]  MRN:  [id number]  Subjective: [name] has no new complaints today. She has been actively participating inpatient unit program including groups and milieu therapy. Patient has been compliant with her medication and has no significant adverse affects. Patient endorsed symptoms of depression anxiety and suicidal ideation the patient denies homicidal ideation patient stated she continued to have a hallucinations but says he can manage them without additional help. She feels both the groups and medications are helpful. She notes that her depression is still a 9/10 and her anxiety is an 7/10. Patient also reported racing thoughts in her head and pain related to her fibroids.  Diagnosis:   DSM5:  Substance/Addictive Disorders:  Alcohol Related Disorder - Moderate (303.90), Cannabis Use Disorder - Severe (304.30) and Opioid Disorder - Moderate (304.00)  Axis I: Asperger's Disorder, Bipolar, Depressed, Psychotic Disorder NOS, Substance Abuse and Substance Induced Mood Disorder Axis II: Deferred Axis III:  Past Medical History  Diagnosis Date  . Hypertension   . Leg pain   . Substance abuse   . Bipolar 1 disorder   . Depression   . Psychosis   . Carpal tunnel syndrome    Axis IV: economic problems, housing problems, other psychosocial or environmental problems, problems related to social environment and problems with primary support group Axis V: 41-50 serious symptoms  ADL's:  Intact  Sleep: Good  Appetite:  Poor  Suicidal Ideation:  Plan:  vague Intent:  none Means:  none Homicidal Ideation:  Denies   Psychiatric Specialty Exam: Review of Systems  HENT: Negative.   Eyes: Negative.   Respiratory: Negative.   Cardiovascular: Negative.   Gastrointestinal: Negative.   Genitourinary: Negative.   Musculoskeletal: Negative.   Skin: Negative.   Neurological: Negative.   Endo/Heme/Allergies: Negative.    Psychiatric/Behavioral: Positive for depression, suicidal ideas, hallucinations and substance abuse. The patient is nervous/anxious.     Blood pressure 101/70, pulse 85, temperature 97.7 F (36.5 C), temperature source Oral, resp. rate 16, height 5' 4.5" (1.638 m), weight 67.586 kg (149 lb), last menstrual period 05/27/2013.Body mass index is 25.19 kg/(m^2).  General Appearance: Casual  Eye Contact::  Fair  Speech:  Normal Rate  Volume:  Normal  Mood:  Anxious and Depressed  Affect:  Congruent  Thought Process:  Coherent  Orientation:  Full (Time, Place, and Person)  Thought Content:  Hallucinations: Auditory  Suicidal Thoughts:  Yes.  without intent/plan  Homicidal Thoughts:  No  Memory:  Immediate;   Fair Recent;   Fair Remote;   Fair  Judgement:  Fair  Insight:  Fair  Psychomotor Activity:  Decreased  Concentration:  Fair  Recall:  Fair  Akathisia:  No  Handed:  Right  AIMS (if indicated):     Assets:  Leisure Time Physical Health Resilience Social Support  Sleep:  Number of Hours: 6.5   Current Medications: Current Facility-Administered Medications  Medication Dose Route Frequency Provider Last Rate Last Dose  . acetaminophen (TYLENOL) tablet 650 mg  650 mg Oral Q6H PRN Larena Sox, MD   650 mg at 06/22/13 0239  . alum & mag hydroxide-simeth (MAALOX/MYLANTA) 200-200-20 MG/5ML suspension 30 mL  30 mL Oral Q4H PRN Larena Sox, MD      . lamoTRIgine (LAMICTAL) tablet 50 mg  50 mg Oral QHS Verne Spurr, PA-C      . magnesium hydroxide (MILK OF MAGNESIA) suspension 30 mL  30 mL Oral Daily PRN Iven Finn Puthuvel,  MD   30 mL at 06/24/13 1832  . metoprolol tartrate (LOPRESSOR) tablet 25 mg  25 mg Oral QAC breakfast Nanine Means, NP   25 mg at 06/25/13 1610  . multivitamin with minerals tablet 1 tablet  1 tablet Oral Daily Verne Spurr, PA-C   1 tablet at 06/25/13 9604  . QUEtiapine (SEROQUEL) tablet 100 mg  100 mg Oral BID Verne Spurr, PA-C   100 mg at 06/25/13 5409   . thiamine (VITAMIN B-1) tablet 100 mg  100 mg Oral Daily Verne Spurr, PA-C   100 mg at 06/25/13 8119  . traZODone (DESYREL) tablet 100 mg  100 mg Oral QHS Nanine Means, NP   100 mg at 06/24/13 2147    Lab Results: No results found for this or any previous visit (from the past 48 hour(s)).  Physical Findings: AIMS: Facial and Oral Movements Muscles of Facial Expression: None, normal Lips and Perioral Area: None, normal Jaw: None, normal Tongue: None, normal,Extremity Movements Upper (arms, wrists, hands, fingers): None, normal Lower (legs, knees, ankles, toes): None, normal, Trunk Movements Neck, shoulders, hips: None, normal, Overall Severity Severity of abnormal movements (highest score from questions above): None, normal Incapacitation due to abnormal movements: None, normal Patient's awareness of abnormal movements (rate only patient's report): No Awareness, Dental Status Current problems with teeth and/or dentures?: No Does patient usually wear dentures?: No  CIWA:  CIWA-Ar Total: 3 COWS:     Treatment Plan Summary: Daily contact with patient to assess and evaluate symptoms and progress in treatment Medication management  Plan:  Review of chart, vital signs, medications, and notes. 1. Continue Seroquel 100 mg PO BID for hallucinations. 2. Increase Lamictal 100 mg daily for mood swings.  3. Continue trazodone 100 mg at bedtime for better sleep 4. Disposition is in progress.   Medical Decision Making Problem Points:  Established problem, stable/improving (1) and Review of psycho-social stressors (1) Data Points:  Review of medication regiment & side effects (2)  I certify that inpatient services furnished can reasonably be expected to improve the patient's condition.   Lelynd Poer,JANARDHAHA R. 06/25/2013 1:33 PM

## 2013-06-25 NOTE — Progress Notes (Signed)
D: Pt is depressed in affect and mood. Pt continues to endorse suicidal ideation. Pt is contracting for safety. She reports that one-on one therapy is more beneficial for her. Pt reports that she can get agitated over repetitive questions from staff (such as her past tx or SI status). However, she does report being aware and respected of our expectations from our job role. Overall, this pt is observed interacting approprietly within the milieu. Pt is cooperative in the care she is receiving her.  A: Writer administered scheduled medications to pt. Continued support and availability as needed was extended to this pt. Staff continue to monitor pt with q19min checks.  R: No adverse drug reactions noted. Pt receptive to treatment. Pt remains safe at this time.

## 2013-06-25 NOTE — [facility] Group Notes (Addendum)
[facility] LCSW Group Therapy      Feelings About Diagnosis 1:15 - 2:30 PM         06/25/2013  3:35 PM    Type of Therapy:  Group Therapy  Participation Level:  Active  Participation Quality:  Appropriate  Affect:  Appropriate  Cognitive:  Alert and Appropriate  Insight:  Developing/Improving and Engaged  Engagement in Therapy:  Developing/Improving and Engaged  Modes of Intervention:  Discussion, Education, Exploration, Problem-Solving, Rapport Building, Support  Summary of Progress/Problems:  Patient actively participated in group. Patient discussed past and present diagnosis and the effects it has had on  life.  Patient talked about family and society being judgmental and the stigma associated with having a mental health diagnosis.  Patient shared she is not pleased by her diagnosis but she is pleased to know there is help for her.  Wynn Banker 06/25/2013  3:35 PM

## 2013-06-25 NOTE — Progress Notes (Signed)
Adult Psychoeducational Group Note  Date:  06/25/2013 Time:  3:16 AM  Group Topic/Focus:  Wrap-Up Group:   The focus of this group is to help patients review their daily goal of treatment and discuss progress on daily workbooks.  Participation Level:  Active  Participation Quality:  Appropriate  Affect:  Appropriate  Cognitive:  Appropriate  Insight: Appropriate  Engagement in Group:  Engaged  Modes of Intervention:  Support  Additional Comments:  Pt stated that her feelings are still the same and that the one positive thing that happened was that when she talked to her doctors that she felt hope. She also stated that being around others has helped her to deal with her problems. Just knowing that she is not the only one going through depression  [name] 06/25/2013, 3:16 AM

## 2013-06-26 MED ORDER — SERTRALINE HCL 50 MG PO TABS
50.0000 mg | ORAL_TABLET | Freq: Every day | ORAL | Status: DC
  Administered 2013-06-26 – 2013-06-27 (×2): 50 mg via ORAL
  Filled 2013-06-26 (×4): qty 1

## 2013-06-26 MED ORDER — LAMOTRIGINE 100 MG PO TABS
150.0000 mg | ORAL_TABLET | Freq: Every day | ORAL | Status: DC
  Administered 2013-06-26 – 2013-06-30 (×5): 150 mg via ORAL
  Filled 2013-06-26 (×7): qty 1.5

## 2013-06-26 NOTE — Progress Notes (Signed)
Nutrition Consult/Follow-up  Consult received for patient with poor appetite and poor intake.  Diet is regular and patient is receiving thiamine and MVI.  Last seen by RD 12/12 and patient refused protein  supplements at that time.  Depression is rated 9/10, hopelessness at 10/10, and anxiety is 7/10.    Patient reports that appetite and intake have started to improve.  Patient is finding foods that she likes to eat.  Reiterated need for adequate nutrition and healthy meals and snacks.  Encouraged patient.  Teach back method used.  Discussed having a nutrition "back up plan" for when appetite is poor.  Does not want nutritional supplements at this time.  Please consult for any further needs.  Oran Rein, RD, LDN Clinical Inpatient Dietitian Pager:  [phone] Weekend and after hours pager:  [phone]

## 2013-06-26 NOTE — [facility] Group Notes (Signed)
[facility] LCSW Group Therapy  Emotional Regulation 1:15 - 2:30 PM  06/26/2013 3:26 PM  Type of Therapy:  Group Therapy  Participation Level:  Minimal  Participation Quality:  Appropriate  Affect:  Depressed, Flat and Tearful  Cognitive:  Appropriate  Insight:  Developing/Improving  Engagement in Therapy:  Developing/Improving  Modes of Intervention:  Clarification, Confrontation, Discussion, Education, Exploration, Problem-solving, Rapport Building and Support  Summary of Progress/Problems:  Patient listened attentive but shared she did not feel like participating in the discussion.  Wynn Banker 06/26/2013, 3:26 PM

## 2013-06-26 NOTE — Clinical Social Work Note (Signed)
Writer met with patient individually following afternoon group.  Patient very depressed, tearful and endorsing SI.  She shared nothing has happened over the past 24 hours that has led to increased depression and hopeless but stated how she is doing today is a part of her mood fluctuation.  Patient shared she believes her children would be better off without her.  She talked about what it is like for them to experience her mood fluctuation and she wants the best for them.  She also talked about the financial difficulties she is experience and how difficult it is to find a reason to go on with life.  Writer asked patient about her children and their names.  She talked about her boys who are 55 and 60 and shared they are really good kids and good students.  As she shared about her children she began to cry less.  Writer complimented patient on the good job she has done raising her children to be good people in the midst of struggling with her illness and lack of resources.  Patient was encouraged to focus on the children as a mean of holding on.   Patient referred to the Transition Team at [facility]  Writer spoke with [name] at [employer] and was advised the Transition Team will work to get her connect with their service.  Patient is agreeable to ACT service.  [name], Team Lead [place], advised during treatment team that she will follow up with their MD on paperwork patient needs for assistance from DSS.

## 2013-06-26 NOTE — Progress Notes (Signed)
D:  Pt has Passive SI-contracts for safety. Pt stated she was +ve AH, but they have not been that bad today. Pt denies HI/VH. Pt is pleasant and cooperative. Pt stated she was angry, agitated today.  A: Pt was offered support and encouragement. Pt was given scheduled medications. Pt was encourage to attend groups. Q 15 minute checks were done for safety.   R:Pt attends groups and interacts well with peers and staff. Pt is taking medication. Pt has no complaints at this time.Pt receptive to treatment and safety maintained on unit.

## 2013-06-26 NOTE — [facility] Group Notes (Signed)
Adult Psychoeducational Group Note  Date:  06/26/2013 Time:  12:40 AM  Group Topic/Focus:  Wrap-Up Group:   The focus of this group is to help patients review their daily goal of treatment and discuss progress on daily workbooks.  Participation Level:  Active  Participation Quality:  Appropriate  Affect:  Appropriate  Cognitive:  Appropriate  Insight: Appropriate  Engagement in Group:  Engaged  Modes of Intervention:  Discussion  Additional Comments:  Patient stated she had a good day.  [name] Shaunte 06/26/2013, 12:40 AM

## 2013-06-26 NOTE — Progress Notes (Signed)
[facility] MD Progress Note  06/26/2013 3:11 PM [name]  MRN:  [id number]  Subjective: Patient seen in her room lying down and reading her handwritten journal and crying. Patient reported she has been depressed and having negative thoughts and feels she has only one way out his killing herself. Patient was offered support and also informed to seek support services from the staff members when she was able to contract for safety while in the hospital. She has been actively participating inpatient unit program including groups and milieu therapy and as says sometimes something triggers to be emotional. Patient endorsed symptoms of depression anxiety and suicidal ideation. The patient denies homicidal ideation, patient stated she continued to have a hallucinations. She notes that her depression is still a 8/10 and her anxiety is an 6/10.   Diagnosis:   DSM5:  Substance/Addictive Disorders:  Alcohol Related Disorder - Moderate (303.90), Cannabis Use Disorder - Severe (304.30) and Opioid Disorder - Moderate (304.00)  Axis I: Asperger's Disorder, Bipolar, Depressed, Psychotic Disorder NOS, Substance Abuse and Substance Induced Mood Disorder Axis II: Deferred Axis III:  Past Medical History  Diagnosis Date  . Hypertension   . Leg pain   . Substance abuse   . Bipolar 1 disorder   . Depression   . Psychosis   . Carpal tunnel syndrome    Axis IV: economic problems, housing problems, other psychosocial or environmental problems, problems related to social environment and problems with primary support group Axis V: 41-50 serious symptoms  ADL's:  Intact  Sleep: Good  Appetite:  Poor  Suicidal Ideation:  Plan:  vague Intent:  none Means:  none Homicidal Ideation:  Denies   Psychiatric Specialty Exam: Review of Systems  HENT: Negative.   Eyes: Negative.   Respiratory: Negative.   Cardiovascular: Negative.   Gastrointestinal: Negative.   Genitourinary: Negative.   Musculoskeletal:  Negative.   Skin: Negative.   Neurological: Negative.   Endo/Heme/Allergies: Negative.   Psychiatric/Behavioral: Positive for depression, suicidal ideas, hallucinations and substance abuse. The patient is nervous/anxious.     Blood pressure 113/80, pulse 80, temperature 97.7 F (36.5 C), temperature source Oral, resp. rate 16, height 5' 4.5" (1.638 m), weight 67.586 kg (149 lb), last menstrual period 05/27/2013.Body mass index is 25.19 kg/(m^2).  General Appearance: Casual  Eye Contact::  Fair  Speech:  Normal Rate  Volume:  Normal  Mood:  Anxious and Depressed  Affect:  Congruent  Thought Process:  Coherent  Orientation:  Full (Time, Place, and Person)  Thought Content:  Hallucinations: Auditory  Suicidal Thoughts:  Yes.  without intent/plan  Homicidal Thoughts:  No  Memory:  Immediate;   Fair Recent;   Fair Remote;   Fair  Judgement:  Fair  Insight:  Fair  Psychomotor Activity:  Decreased  Concentration:  Fair  Recall:  Fair  Akathisia:  No  Handed:  Right  AIMS (if indicated):     Assets:  Leisure Time Physical Health Resilience Social Support  Sleep:  Number of Hours: 6.5   Current Medications: Current Facility-Administered Medications  Medication Dose Route Frequency Provider Last Rate Last Dose  . acetaminophen (TYLENOL) tablet 650 mg  650 mg Oral Q6H PRN Larena Sox, MD   650 mg at 06/26/13 0852  . alum & mag hydroxide-simeth (MAALOX/MYLANTA) 200-200-20 MG/5ML suspension 30 mL  30 mL Oral Q4H PRN Larena Sox, MD      . lamoTRIgine (LAMICTAL) tablet 150 mg  150 mg Oral QHS Nehemiah Settle, MD      .  magnesium hydroxide (MILK OF MAGNESIA) suspension 30 mL  30 mL Oral Daily PRN Larena Sox, MD   30 mL at 06/24/13 1832  . metoprolol tartrate (LOPRESSOR) tablet 25 mg  25 mg Oral QAC breakfast Nanine Means, NP   25 mg at 06/26/13 0848  . multivitamin with minerals tablet 1 tablet  1 tablet Oral Daily Verne Spurr, PA-C   1 tablet at 06/26/13 0848   . QUEtiapine (SEROQUEL) tablet 100 mg  100 mg Oral BID Verne Spurr, PA-C   100 mg at 06/26/13 0848  . sertraline (ZOLOFT) tablet 50 mg  50 mg Oral Daily Nehemiah Settle, MD      . thiamine (VITAMIN B-1) tablet 100 mg  100 mg Oral Daily Verne Spurr, PA-C   100 mg at 06/26/13 0848  . traZODone (DESYREL) tablet 100 mg  100 mg Oral QHS Nanine Means, NP   100 mg at 06/25/13 2203    Lab Results: No results found for this or any previous visit (from the past 48 hour(s)).  Physical Findings: AIMS: Facial and Oral Movements Muscles of Facial Expression: None, normal Lips and Perioral Area: None, normal Jaw: None, normal Tongue: None, normal,Extremity Movements Upper (arms, wrists, hands, fingers): None, normal Lower (legs, knees, ankles, toes): None, normal, Trunk Movements Neck, shoulders, hips: None, normal, Overall Severity Severity of abnormal movements (highest score from questions above): None, normal Incapacitation due to abnormal movements: None, normal Patient's awareness of abnormal movements (rate only patient's report): No Awareness, Dental Status Current problems with teeth and/or dentures?: No Does patient usually wear dentures?: No  CIWA:  CIWA-Ar Total: 2 COWS:     Treatment Plan Summary: Daily contact with patient to assess and evaluate symptoms and progress in treatment Medication management  Plan:  Review of chart, vital signs, medications, and notes. 1. Continue Seroquel 100 mg PO BID for hallucinations. 2. Increase Lamictal 150 mg daily for mood swings.  3. .add Zoloft 50 mg daily for depression 3. Continue trazodone 100 mg at bedtime for better sleep 4. Disposition is in progress patient may be discharged and of the week and she can continue to contract for safety, clinically improves and developed better coping skills.   Medical Decision Making Problem Points:  Established problem, stable/improving (1) and Review of psycho-social stressors (1) Data  Points:  Review of medication regiment & side effects (2)  I certify that inpatient services furnished can reasonably be expected to improve the patient's condition.   Filmore Molyneux,JANARDHAHA R. 06/26/2013 3:11 PM

## 2013-06-26 NOTE — [facility] Group Notes (Signed)
[facility] LCSW Aftercare Discharge Planning Group Note   06/26/2013 11:22 AM  Participation Quality:  Patient did not attend group.    [name], [name]

## 2013-06-26 NOTE — Progress Notes (Signed)
Adult Psychoeducational Group Note  Date:  06/26/2013 Time:  10:58 PM  Group Topic/Focus:  Wrap-Up Group:   The focus of this group is to help patients review their daily goal of treatment and discuss progress on daily workbooks.  Participation Level:  Active  Participation Quality:  Appropriate  Affect:  Appropriate  Cognitive:  Alert  Insight: Appropriate  Engagement in Group:  Engaged  Modes of Intervention:  Discussion  Additional Comments:  Pt stated that her day started off awful because she was angry,sad, and lonely. Pt did state that other pt helped to uplift her.  Kaleen Odea R 06/26/2013, 10:58 PM

## 2013-06-26 NOTE — Progress Notes (Signed)
Recreation Therapy Notes  Date: 12.17.2014 Time: 3:00pm Location: [address] Dayroom   Group Topic: Communication, Team Building.   Goal Area(s) Addresses:  Patient will effectively work with peer towards shared goal.  Patient will identify skill used to make activity successful.  Patient will identify how skills used during activity can be used to reach post d/c goals.   Behavioral Response: Appropraite  Intervention: Survival Scenario  Activity: Space Survival. Patients were given a scenario about being lost in space and a list of supplies they were able to salvage. Patients were asked to individually rank items and then rank items as a group.   Education: Social Skills, Discharge Planning.    Education Outcome: Acknowledges edcuation  Clinical Observations/Feedback: Patient was actively engaged in group activity, individually ranking items, as well as working with peers to arrive at a group ranking. Patient made no contributions to group discussion, but appeared to actively listen as she maintained appropriate eye contact with speaker.   Saim Almanza L Illyana Schorsch, LRT/CTRS  Ahmari Garton L 06/26/2013 5:12 PM 

## 2013-06-26 NOTE — Progress Notes (Signed)
Patient ID: [name], female   DOB: [birth year], 42 y.o.   MRN: [id number]   D: Patient presents with tears in her eyes and depressed mood. Pt. Denies HI and A/V Hallucinations. Patient endorses on and off thoughts of SI but contracts for safety. Patient reports pain in abdomen and requested something for pain. Patient also reports her depression and hopelessness at a 10/10 today.   A: Emotional support and encouragement provided to the patient. Writer asked why the patient was crying and tried to converse with patient but patient was uncooperative and did not disclose anything to Clinical research associate. Patient reported on her daily inventory sheet that she felt like she doesn't, "feel strong or capable of moving forward with life." Patient also states that she wants to stay in her room and welcome death without her kids knowing. Writer relayed the message to MD.  R: Patient was irritable and tearful with Clinical research associate when Clinical research associate tried to talk to patient. Patient continued to cry. Patient was seen in the milieu sometimes, more frequently later in the day. Q15 minute checks are maintained for safety.

## 2013-06-27 MED ORDER — LITHIUM CARBONATE 300 MG PO CAPS
600.0000 mg | ORAL_CAPSULE | Freq: Every day | ORAL | Status: DC
  Administered 2013-06-27: 600 mg via ORAL
  Filled 2013-06-27 (×3): qty 2

## 2013-06-27 MED ORDER — SERTRALINE HCL 100 MG PO TABS
100.0000 mg | ORAL_TABLET | Freq: Every day | ORAL | Status: DC
  Administered 2013-06-28: 100 mg via ORAL
  Filled 2013-06-27 (×2): qty 1
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
## 2013-06-27 NOTE — Progress Notes (Signed)
D: Pt was more talkative in conversation this evening. She continues to verbalize her frustration with social services. She is wishing to change her DSS worker, due to her lack of progression in her case. Pt wants stabilization in her mental status so she can seek employment that she is able to maintain. As of now, she believes that her kids are better off without her. She reports some auditory hallucinations. She is passively SI and contracts for safety. Pt attended group this evening.  A: Writer administered scheduled medications to pt. Continued support and availability as needed was extended to this pt. Staff continue to monitor pt with q13min checks.  R: No adverse drug reactions noted. Pt receptive to treatment. Pt remains safe at this time.

## 2013-06-27 NOTE — [facility] Group Notes (Signed)
[facility] LCSW Group Therapy  Living A Balanced Life  1:15 - 2: 30          06/27/2013  4:24 PM    Type of Therapy:  Group Therapy  Participation Level:  Minimal  Participation Quality: Minimal  Affect:  Appropriate  Cognitive:  Attentive Appropriate  Insight: Developing/Improving  Engagement in Therapy:  Developing/Improving  Modes of Intervention:  Discussion Exploration Problem-Solving Supportive.   Summary of Progress/Problems: Patient listened attentively but did not engage in discussion.   Wynn Banker 06/27/2013  4:24 PM

## 2013-06-27 NOTE — Progress Notes (Signed)
Patient ID: [name], female   DOB: May 17, 1971, 42 y.o.   MRN: [id number] [facility] MD Progress Note  06/27/2013 5:02 PM [name]  MRN:  [id number]  Subjective: [name] states she is better but, "i'm not where I need to be."" I want my Lithium back I've been on it for years. I was spreading it out because I didn't have any money." She also asks for an increase in her Zoloft as well. She notes that she is still having mood swings and irritability and is concerned about her anger.     Diagnosis:   DSM5:  Substance/Addictive Disorders:  Alcohol Related Disorder - Moderate (303.90), Cannabis Use Disorder - Severe (304.30) and Opioid Disorder - Moderate (304.00)  Axis I: Asperger's Disorder, Bipolar, Depressed, Psychotic Disorder NOS, Substance Abuse and Substance Induced Mood Disorder Axis II: Deferred Axis III:  Past Medical History  Diagnosis Date  . Hypertension   . Leg pain   . Substance abuse   . Bipolar 1 disorder   . Depression   . Psychosis   . Carpal tunnel syndrome    Axis IV: economic problems, housing problems, other psychosocial or environmental problems, problems related to social environment and problems with primary support group Axis V: 41-50 serious symptoms  ADL's:  Intact  Sleep: Good  Appetite:  Poor  Suicidal Ideation:  Plan:  vague Intent:  none Means:  none Homicidal Ideation:  Denies   Psychiatric Specialty Exam: Review of Systems  Constitutional: Negative.  Negative for fever, chills, weight loss, malaise/fatigue and diaphoresis.  HENT: Negative.  Negative for congestion and sore throat.   Eyes: Negative.  Negative for blurred vision, double vision and photophobia.  Respiratory: Negative.  Negative for cough, shortness of breath and wheezing.   Cardiovascular: Negative.  Negative for chest pain, palpitations and PND.  Gastrointestinal: Negative.  Negative for heartburn, nausea, vomiting, abdominal pain, diarrhea and constipation.   Genitourinary: Negative.   Musculoskeletal: Negative.  Negative for falls, joint pain and myalgias.  Skin: Negative.   Neurological: Positive for focal weakness. Negative for dizziness, tingling, tremors, sensory change, speech change, seizures, loss of consciousness, weakness and headaches.  Endo/Heme/Allergies: Negative.  Negative for polydipsia. Does not bruise/bleed easily.  Psychiatric/Behavioral: Positive for depression, suicidal ideas, hallucinations and substance abuse. Negative for memory loss. The patient is nervous/anxious. The patient does not have insomnia.     Blood pressure 107/74, pulse 96, temperature 97.8 F (36.6 C), temperature source Oral, resp. rate 16, height 5' 4.5" (1.638 m), weight 67.586 kg (149 lb), last menstrual period 05/27/2013.Body mass index is 25.19 kg/(m^2).  General Appearance: Casual  Eye Contact::  Fair  Speech:  Normal Rate  Volume:  Normal  Mood:  Anxious and Depressed  Affect:  Congruent  Thought Process:  Coherent  Orientation:  Full (Time, Place, and Person)  Thought Content:  Hallucinations: Auditory  Suicidal Thoughts:  Yes.  without intent/plan  Homicidal Thoughts:  No  Memory:  Immediate;   Fair Recent;   Fair Remote;   Fair  Judgement:  Fair  Insight:  Fair  Psychomotor Activity:  Decreased  Concentration:  Fair  Recall:  Fair  Akathisia:  No  Handed:  Right  AIMS (if indicated):     Assets:  Leisure Time Physical Health Resilience Social Support  Sleep:  Number of Hours: 5.25   Current Medications: Current Facility-Administered Medications  Medication Dose Route Frequency Provider Last Rate Last Dose  . acetaminophen (TYLENOL) tablet 650 mg  650 mg Oral Q6H  PRN Larena Sox, MD   650 mg at 06/27/13 1201  . alum & mag hydroxide-simeth (MAALOX/MYLANTA) 200-200-20 MG/5ML suspension 30 mL  30 mL Oral Q4H PRN Larena Sox, MD      . lamoTRIgine (LAMICTAL) tablet 150 mg  150 mg Oral QHS Nehemiah Settle, MD   150  mg at 06/26/13 2231  . magnesium hydroxide (MILK OF MAGNESIA) suspension 30 mL  30 mL Oral Daily PRN Larena Sox, MD   30 mL at 06/24/13 1832  . metoprolol tartrate (LOPRESSOR) tablet 25 mg  25 mg Oral QAC breakfast Nanine Means, NP   25 mg at 06/27/13 [phone]  . multivitamin with minerals tablet 1 tablet  1 tablet Oral Daily Verne Spurr, PA-C   1 tablet at 06/27/13 [phone]  . QUEtiapine (SEROQUEL) tablet 100 mg  100 mg Oral BID Verne Spurr, PA-C   100 mg at 06/27/13 5409  . sertraline (ZOLOFT) tablet 50 mg  50 mg Oral Daily Nehemiah Settle, MD   50 mg at 06/27/13 0917  . thiamine (VITAMIN B-1) tablet 100 mg  100 mg Oral Daily Verne Spurr, PA-C   100 mg at 06/27/13 8119  . traZODone (DESYREL) tablet 100 mg  100 mg Oral QHS Nanine Means, NP   100 mg at 06/26/13 2231    Lab Results: No results found for this or any previous visit (from the past 48 hour(s)).  Physical Findings: AIMS: Facial and Oral Movements Muscles of Facial Expression: None, normal Lips and Perioral Area: None, normal Jaw: None, normal Tongue: None, normal,Extremity Movements Upper (arms, wrists, hands, fingers): None, normal Lower (legs, knees, ankles, toes): None, normal, Trunk Movements Neck, shoulders, hips: None, normal, Overall Severity Severity of abnormal movements (highest score from questions above): None, normal Incapacitation due to abnormal movements: None, normal Patient's awareness of abnormal movements (rate only patient's report): No Awareness, Dental Status Current problems with teeth and/or dentures?: No Does patient usually wear dentures?: No  CIWA:  CIWA-Ar Total: 2 COWS:     Treatment Plan Summary: Daily contact with patient to assess and evaluate symptoms and progress in treatment Medication management  Plan:  Review of chart, vital signs, medications, and notes. 1. Continue Seroquel 100 mg PO BID for hallucinations. 2. Continue Lamictal 150 mg daily for mood swings. Patient  chose to have lithium started rather than increase the Lamictal. States she has been on both at the same time previously. 3.  Increase Zoloft to 100mg  daily for depression 4.. Continue trazodone 100 mg at bedtime for better sleep 5. Will add Lithium 600mg  po at hs for mood stabilization per patient's request. 6. Disposition is in progress patient may be discharged and of the week and she can continue to contract for safety, clinically improves and developed better coping skills.   Medical Decision Making Problem Points:  Established problem, stable/improving (1) and Review of psycho-social stressors (1) Data Points:  Review of medication regiment & side effects (2)  I certify that inpatient services furnished can reasonably be expected to improve the patient's condition.   Rona Ravens. [place] RPAC 6:17 PM 06/27/2013  Reviewed the information documented and agree with the treatment plan.  Akia Desroches,JANARDHAHA R. 06/28/2013 9:23 AM

## 2013-06-27 NOTE — Progress Notes (Signed)
Pt attended Karaoke.  

## 2013-06-27 NOTE — Progress Notes (Signed)
Adult Psychoeducational Group Note  Date: 06/27/2013  Time:11:00am  Group Topic/Focus:  Making Healthy Choices: The focus of this group is to help patients identify negative/unhealthy choices they were using prior to admission and identify positive/healthier coping strategies to replace them upon discharge.  Participation Level: Active  Participation Quality: Appropriate, Sharing and Supportive  Affect: Appropriate  Cognitive: Appropriate  Insight: Appropriate  Engagement in Group: Engaged and Supportive  Modes of Intervention: Discussion, Education, Problem-solving and Support  Additional Comments: Pt was appropriate in group.  [name] M  06/27/2013, 6:42 PM

## 2013-06-27 NOTE — Progress Notes (Signed)
Patient ID: [name], female   DOB: 08/30/1970, 42 y.o.   MRN: [id number]  D: Pt. Denies HI and visual hallucinations. However, patient does endorse SI and auditory hallucinations. Patient verbally contracts for safety. Patient states that there are a lot of voices, not just one.  Patient reports pain in her head and her abdomen, patient reports pain in abdomen is due to her fibroids. Patient rates her depression at 9/10 and her hopelessness at 8/10. Patient reports on her daily inventory sheet, "I am going to make it one day at a time as long as I can get my meds correct." Patient also states that her mood is fluctuating frequently throughout the day. Patient states, "it keeps going up and down."  A: Support and encouragement provided to the patient. Scheduled medications given to patient per physician's orders. Writer spoke to patient about her anger and inquired if writer could do anything for patient. Patient denied needing anything. Patient states, "I have always been angry. I know the root of it." However, did not disclose.  R: Patient is receptive and more cooperative with Clinical research associate today. Patient is seen in the milieu more frequently and interacting with other patients. Patient still states that she has anger issues but does not have any self or non-self harm behaviors or threats noted. Q15 minute checks are maintained for safety.

## 2013-06-27 NOTE — Progress Notes (Signed)
The focus of this group is to educate the patient on the purpose and policies of crisis stabilization and provide a format to answer questions about their admission.  The group details unit policies and expectations of patients while admitted.  Patient attended 0900 nurse education orientation group this morning.  Patient listened, appropriate affect, alert, appropriate insight and engagement.  Today patient will work on 3 goals for discharge.  

## 2013-06-28 MED ORDER — LITHIUM CARBONATE 300 MG PO CAPS
900.0000 mg | ORAL_CAPSULE | Freq: Every day | ORAL | Status: DC
  Administered 2013-06-28 – 2013-06-30 (×3): 900 mg via ORAL
  Filled 2013-06-28 (×5): qty 3

## 2013-06-28 MED ORDER — SERTRALINE HCL 100 MG PO TABS
200.0000 mg | ORAL_TABLET | Freq: Every day | ORAL | Status: DC
  Administered 2013-06-29 – 2013-07-01 (×3): 200 mg via ORAL
  Filled 2013-06-28 (×5): qty 2

## 2013-06-28 NOTE — Progress Notes (Signed)
[facility] MD Progress Note  06/28/2013 12:37 PM [name]  MRN:  [id number]  Subjective: Patient complaint feeding mood swings anxiety, middle insomnia and increase the panic/anxiety episodes with sweating and another night. Patient was isolated staying in her room and in staff requested to participate in groups. Patient also reported she has been feeling angry but has no when necessary medications. Patient has been shaking her legs while talking about her stressors. Patient reported she has been feeling that way since he ran out of money and ran out of the medications and has been self-medicating with alcohol. Patient reported she she has been feeling some hope at this time and requesting to change her medications for therapeutic benefit. She mood as 8/10 contract for safety at this time  Diagnosis:   DSM5:  Substance/Addictive Disorders:  Alcohol Related Disorder - Moderate (303.90), Cannabis Use Disorder - Severe (304.30) and Opioid Disorder - Moderate (304.00)  Axis I: Asperger's Disorder, Bipolar, Depressed, Psychotic Disorder NOS, Substance Abuse and Substance Induced Mood Disorder Axis II: Deferred Axis III:  Past Medical History  Diagnosis Date  . Hypertension   . Leg pain   . Substance abuse   . Bipolar 1 disorder   . Depression   . Psychosis   . Carpal tunnel syndrome    Axis IV: economic problems, housing problems, other psychosocial or environmental problems, problems related to social environment and problems with primary support group Axis V: 41-50 serious symptoms  ADL's:  Intact  Sleep: Good  Appetite:  Poor  Suicidal Ideation:  Plan:  vague Intent:  none Means:  none Homicidal Ideation:  Denies   Psychiatric Specialty Exam: Review of Systems  Constitutional: Negative.  Negative for fever, chills, weight loss, malaise/fatigue and diaphoresis.  HENT: Negative.  Negative for congestion and sore throat.   Eyes: Negative.  Negative for blurred vision, double  vision and photophobia.  Respiratory: Negative.  Negative for cough, shortness of breath and wheezing.   Cardiovascular: Negative.  Negative for chest pain, palpitations and PND.  Gastrointestinal: Negative.  Negative for heartburn, nausea, vomiting, abdominal pain, diarrhea and constipation.  Genitourinary: Negative.   Musculoskeletal: Negative.  Negative for falls, joint pain and myalgias.  Skin: Negative.   Neurological: Positive for focal weakness. Negative for dizziness, tingling, tremors, sensory change, speech change, seizures, loss of consciousness, weakness and headaches.  Endo/Heme/Allergies: Negative.  Negative for polydipsia. Does not bruise/bleed easily.  Psychiatric/Behavioral: Positive for depression, suicidal ideas, hallucinations and substance abuse. Negative for memory loss. The patient is nervous/anxious. The patient does not have insomnia.     Blood pressure 125/83, pulse 103, temperature 98.6 F (37 C), temperature source Oral, resp. rate 18, height 5' 4.5" (1.638 m), weight 67.586 kg (149 lb), last menstrual period 05/27/2013.Body mass index is 25.19 kg/(m^2).  General Appearance: Casual  Eye Contact::  Fair  Speech:  Normal Rate  Volume:  Normal  Mood:  Anxious and Depressed  Affect:  Congruent  Thought Process:  Coherent  Orientation:  Full (Time, Place, and Person)  Thought Content:  Hallucinations: Auditory  Suicidal Thoughts:  Yes.  without intent/plan  Homicidal Thoughts:  No  Memory:  Immediate;   Fair Recent;   Fair Remote;   Fair  Judgement:  Fair  Insight:  Fair  Psychomotor Activity:  Decreased  Concentration:  Fair  Recall:  Fair  Akathisia:  No  Handed:  Right  AIMS (if indicated):     Assets:  Leisure Time Physical Health Resilience Social Support  Sleep:  Number of Hours: 5.25   Current Medications: Current Facility-Administered Medications  Medication Dose Route Frequency Provider Last Rate Last Dose  . acetaminophen (TYLENOL) tablet  650 mg  650 mg Oral Q6H PRN Larena Sox, MD   650 mg at 06/27/13 1201  . alum & mag hydroxide-simeth (MAALOX/MYLANTA) 200-200-20 MG/5ML suspension 30 mL  30 mL Oral Q4H PRN Larena Sox, MD      . lamoTRIgine (LAMICTAL) tablet 150 mg  150 mg Oral QHS Nehemiah Settle, MD   150 mg at 06/27/13 2302  . lithium carbonate capsule 900 mg  900 mg Oral QHS Nehemiah Settle, MD      . magnesium hydroxide (MILK OF MAGNESIA) suspension 30 mL  30 mL Oral Daily PRN Larena Sox, MD   30 mL at 06/24/13 1832  . metoprolol tartrate (LOPRESSOR) tablet 25 mg  25 mg Oral QAC breakfast Nanine Means, NP   25 mg at 06/28/13 1610  . multivitamin with minerals tablet 1 tablet  1 tablet Oral Daily Verne Spurr, PA-C   1 tablet at 06/28/13 [phone]  . QUEtiapine (SEROQUEL) tablet 100 mg  100 mg Oral BID Verne Spurr, PA-C   100 mg at 06/28/13 5409  . [START ON 06/29/2013] sertraline (ZOLOFT) tablet 200 mg  200 mg Oral Daily Nehemiah Settle, MD      . thiamine (VITAMIN B-1) tablet 100 mg  100 mg Oral Daily Verne Spurr, PA-C   100 mg at 06/28/13 8119  . traZODone (DESYREL) tablet 100 mg  100 mg Oral QHS Nanine Means, NP   100 mg at 06/27/13 2302    Lab Results: No results found for this or any previous visit (from the past 48 hour(s)).  Physical Findings: AIMS: Facial and Oral Movements Muscles of Facial Expression: None, normal Lips and Perioral Area: None, normal Jaw: None, normal Tongue: None, normal,Extremity Movements Upper (arms, wrists, hands, fingers): None, normal Lower (legs, knees, ankles, toes): None, normal, Trunk Movements Neck, shoulders, hips: None, normal, Overall Severity Severity of abnormal movements (highest score from questions above): None, normal Incapacitation due to abnormal movements: None, normal Patient's awareness of abnormal movements (rate only patient's report): No Awareness, Dental Status Current problems with teeth and/or dentures?: No Does  patient usually wear dentures?: No  CIWA:  CIWA-Ar Total: 1 COWS:     Treatment Plan Summary: Daily contact with patient to assess and evaluate symptoms and progress in treatment Medication management  Plan:  Review of chart, vital signs, medications, and notes. 1. Continue Seroquel 100 mg PO BID for hallucinations. 2. Continue Lamictal 150 mg daily for mood swings.  3. Increase Zoloft to 200mg  daily for depression 4. Continue trazodone 100 mg at bedtime for better sleep 5. Increase Lithium 900mg  po at hs for mood stabilization 6. Disposition is in progress patient may be discharged and of the week and she can continue to contract for safety, clinically improves and developed better coping skills.   Medical Decision Making Problem Points:  Established problem, stable/improving (1) and Review of psycho-social stressors (1) Data Points:  Review of medication regiment & side effects (2)  I certify that inpatient services furnished can reasonably be expected to improve the patient's condition.    Leona Alen,JANARDHAHA R. 06/28/2013 12:37 PM

## 2013-06-28 NOTE — [facility] Group Notes (Signed)
[facility] LCSW Group Therapy  Feelings Around Relapse 1:15 -2:30        06/28/2013  3:06 PM   Type of Therapy:  Group Therapy  Participation Level:  Appropriate  Participation Quality:  Appropriate  Affect:  Appropriate  Cognitive:  Attentive Appropriate  Insight:  Developing/Improving  Engagement in Therapy: Developing/Improving  Modes of Intervention:  Discussion Exploration Problem-Solving Supportive  Summary of Progress/Problems:  The topic for today was feelings around relapse.    Patient processed feelings toward relapse and was able to relate to peers. She shared relapsing for her would be focusing on the past and not taking her medications.  Patient shared she has a lot of hope for the future at this time. Patient identified coping skills that can be used to prevent a relapse.   Wynn Banker 06/28/2013 3:06 PM

## 2013-06-28 NOTE — Progress Notes (Signed)
Pt presents with a brighter affect this evening. She is endorsing a sense of hope in her mental health improvement. She continues to be positive for SI, but contracts for safety. She is starting to see an improvement with her medications. She is pleased with the initiation of her mood stabilizer. She is observed interacting well with others. Pt was present for karaoke this evening. A: Writer administered scheduled medications to pt. Continued support and availability as needed was extended to this pt. Staff continue to monitor pt with q71min checks.  R: No adverse drug reactions noted. Pt receptive to treatment. Pt remains safe at this time.

## 2013-06-28 NOTE — [facility] Group Notes (Signed)
[facility] LCSW Aftercare Discharge Planning Group Note   06/28/2013 1:17 PM    Participation Quality:  Appropraite  Mood/Affect:  Appropriate  Depression Rating:  10  Anxiety Rating:  10  Thoughts of Suicide:  Yes  Will you contract for safety?   Yes  Current AVH:  No  Plan for Discharge/Comments:  Patient attended discharge planning group and actively participated in group.  Patient continues to endorse SI but contracts for safety.  She will follow up with [facility] for outpatient services.CSW provided all participants with daily workbook.   Transportation Means: Patient has transportation.   Supports:  Patient has a support system.   Veanna Dower, [name]

## 2013-06-28 NOTE — Progress Notes (Addendum)
D: Patient denies HI and visual hallucinations. The patient is positive for auditory hallucinations and is positive for SI. However, the patient verbally contracts for safety. The patient states "I'm hearing occasional whispers but they are going away." The patient also states "I want to discuss my medications with a doctor." The patient states that she wants "more medication and something to take as needed." The patient is attending groups on unit.  A: Patient given emotional support from RN. Patient encouraged to come to staff with concerns and/or questions. Patient's medication routine continued. Patient's orders and plan of care reviewed.  R: Patient remains cooperative. Will continue to monitor patient q15 minutes for safety.

## 2013-06-28 NOTE — Tx Team (Signed)
Interdisciplinary Treatment Plan Update   Date Reviewed:  06/28/2013  Time Reviewed:  10:03 AM  Progress in Treatment:   Attending groups: Yes Participating in groups: Yes Taking medication as prescribed: Yes  Tolerating medication: Yes Family/Significant other contact made:  Yes, collateral contact made with daughter. Patient understands diagnosis: Yes  Discussing patient identified problems/goals with staff: Yes Medical problems stabilized or resolved: Yes Denies suicidal/homicidal ideation: Yes, patient denies SI/HI. Patient has not harmed self or others: Yes  For review of initial/current patient goals, please see plan of care.  Estimated Length of Stay:  5 days  Reasons for Continued Hospitalization:  Anxiety Depression Medication stabilization Suicidal ideation  New Problems/Goals identified:    Discharge Plan or Barriers:   Home with outpatient follow up to be determined  Additional Comments:   MD to continue with medication stabilization  Attendees:  Patient:  06/28/2013 10:03 AM   Signature: Mervyn Gay, MD 06/28/2013 10:03 AM  Signature:   06/28/2013 10:03 AM  Signature: Leighton Parody, RN 06/28/2013 10:03 AM   Signature: Nestor Ramp,  RN 06/28/2013 10:03 AM  Signature:  Onnie Boer, RN - [facility] 06/28/2013 10:03 AM  Signature:  Juline Patch, LCSW 06/28/2013 10:03 AM  Signature:  Reyes Ivan, LCSW 06/28/2013 10:03 AM  Signature:   06/28/2013 10:03 AM   06/28/2013 10:03 AM   06/28/2013  10:03 AM   06/28/2013  10:03 AM  Signature:  06/28/2013  10:03 AM    Scribe for Treatment Team:   Juline Patch,  06/28/2013 10:03 AM

## 2013-06-29 DIAGNOSIS — F848 Other pervasive developmental disorders: Secondary | ICD-10-CM

## 2013-06-29 MED ORDER — MAGNESIUM CITRATE PO SOLN
1.0000 | Freq: Once | ORAL | Status: AC
  Administered 2013-06-30: 1 via ORAL

## 2013-06-29 NOTE — Progress Notes (Signed)
Patient ID: [name], female   DOB: March 09, 1971, 42 y.o.   MRN: [id number] Patient ID: [name], female   DOB: [birth year], 42 y.o.   MRN: [id number] [facility] MD Progress Note  06/29/2013 1:50 PM [name]  MRN:  [id number]  Subjective: [name] states she is better, she can begin to feel the effects of the medication working. States the Seroquel has helped with her racing thoughts. She admits that her auditory hallucinations have calmed down, but still present" If you can just get out of here you can drink and smoke again." She reports that writing has been a great outlet for her, something she learned how to do in the third grade. She expresses concerns about the upcoming holidays and not being able to give her kids anything. She still expresses concerns about increased levels of anxiety, rating 8.5/10 and her depression is at 8.5/10. She reports resting well last night, however  Upon discharge she is hoping to go home. She is attending groups. Denies any SI/HI.   Diagnosis:   DSM5:  Substance/Addictive Disorders:  Alcohol Related Disorder - Moderate (303.90), Cannabis Use Disorder - Severe (304.30) and Opioid Disorder - Moderate (304.00)  Axis I: Asperger's Disorder, Bipolar, Depressed, Psychotic Disorder NOS, Substance Abuse and Substance Induced Mood Disorder Axis II: Deferred Axis III:  Past Medical History  Diagnosis Date  . Hypertension   . Leg pain   . Substance abuse   . Bipolar 1 disorder   . Depression   . Psychosis   . Carpal tunnel syndrome    Axis IV: economic problems, housing problems, other psychosocial or environmental problems, problems related to social environment and problems with primary support group Axis V: 41-50 serious symptoms  ADL's:  Intact  Sleep: Good  Appetite:  Poor  Suicidal Ideation:  Plan:  vague Intent:  none Means:  none Homicidal Ideation:  Denies   Psychiatric Specialty Exam: Review of Systems  Constitutional: Negative.   Negative for fever, chills, weight loss, malaise/fatigue and diaphoresis.  HENT: Negative.  Negative for congestion and sore throat.   Eyes: Negative.  Negative for blurred vision, double vision and photophobia.  Respiratory: Negative.  Negative for cough, shortness of breath and wheezing.   Cardiovascular: Negative.  Negative for chest pain, palpitations and PND.  Gastrointestinal: Negative.  Negative for heartburn, nausea, vomiting, abdominal pain, diarrhea and constipation.  Genitourinary: Negative.   Musculoskeletal: Negative.  Negative for falls, joint pain and myalgias.  Skin: Negative.   Neurological: Positive for focal weakness. Negative for dizziness, tingling, tremors, sensory change, speech change, seizures, loss of consciousness, weakness and headaches.  Endo/Heme/Allergies: Negative.  Negative for polydipsia. Does not bruise/bleed easily.  Psychiatric/Behavioral: Positive for depression, suicidal ideas, hallucinations and substance abuse. Negative for memory loss. The patient is nervous/anxious. The patient does not have insomnia.     Blood pressure 132/88, pulse 89, temperature 98.7 F (37.1 C), temperature source Oral, resp. rate 18, height 5' 4.5" (1.638 m), weight 67.586 kg (149 lb), last menstrual period 05/27/2013.Body mass index is 25.19 kg/(m^2).  General Appearance: Casual  Eye Contact::  Fair  Speech:  Normal Rate  Volume:  Normal  Mood:  Anxious and Depressed  Affect:  Congruent  Thought Process:  Coherent  Orientation:  Full (Time, Place, and Person)  Thought Content:  Hallucinations: Auditory  Suicidal Thoughts:  Yes.  without intent/plan  Homicidal Thoughts:  No  Memory:  Immediate;   Fair Recent;   Fair Remote;   Fair  Judgement:  Fair  Insight:  Fair  Psychomotor Activity:  Decreased  Concentration:  Fair  Recall:  Fair  Akathisia:  No  Handed:  Right  AIMS (if indicated):     Assets:  Leisure Time Physical Health Resilience Social Support   Sleep:  Number of Hours: 5   Current Medications: Current Facility-Administered Medications  Medication Dose Route Frequency Provider Last Rate Last Dose  . acetaminophen (TYLENOL) tablet 650 mg  650 mg Oral Q6H PRN Larena Sox, MD   650 mg at 06/27/13 1201  . alum & mag hydroxide-simeth (MAALOX/MYLANTA) 200-200-20 MG/5ML suspension 30 mL  30 mL Oral Q4H PRN Larena Sox, MD      . lamoTRIgine (LAMICTAL) tablet 150 mg  150 mg Oral QHS Nehemiah Settle, MD   150 mg at 06/28/13 2156  . lithium carbonate capsule 900 mg  900 mg Oral QHS Nehemiah Settle, MD   900 mg at 06/28/13 2156  . magnesium hydroxide (MILK OF MAGNESIA) suspension 30 mL  30 mL Oral Daily PRN Larena Sox, MD   30 mL at 06/24/13 1832  . metoprolol tartrate (LOPRESSOR) tablet 25 mg  25 mg Oral QAC breakfast Nanine Means, NP   25 mg at 06/29/13 0649  . multivitamin with minerals tablet 1 tablet  1 tablet Oral Daily Verne Spurr, PA-C   1 tablet at 06/29/13 4098  . QUEtiapine (SEROQUEL) tablet 100 mg  100 mg Oral BID Verne Spurr, PA-C   100 mg at 06/29/13 1191  . sertraline (ZOLOFT) tablet 200 mg  200 mg Oral Daily Nehemiah Settle, MD   200 mg at 06/29/13 0934  . thiamine (VITAMIN B-1) tablet 100 mg  100 mg Oral Daily Verne Spurr, PA-C   100 mg at 06/29/13 4782  . traZODone (DESYREL) tablet 100 mg  100 mg Oral QHS Nanine Means, NP   100 mg at 06/28/13 2156    Lab Results: No results found for this or any previous visit (from the past 48 hour(s)).  Physical Findings: AIMS: Facial and Oral Movements Muscles of Facial Expression: None, normal Lips and Perioral Area: None, normal Jaw: None, normal Tongue: None, normal,Extremity Movements Upper (arms, wrists, hands, fingers): None, normal Lower (legs, knees, ankles, toes): None, normal, Trunk Movements Neck, shoulders, hips: None, normal, Overall Severity Severity of abnormal movements (highest score from questions above): None,  normal Incapacitation due to abnormal movements: None, normal Patient's awareness of abnormal movements (rate only patient's report): No Awareness, Dental Status Current problems with teeth and/or dentures?: No Does patient usually wear dentures?: No  CIWA:  CIWA-Ar Total: 1 COWS:     Treatment Plan Summary: Daily contact with patient to assess and evaluate symptoms and progress in treatment Medication management  Plan:  Review of chart, vital signs, medications, and notes. 1. Continue Seroquel 100 mg PO BID for hallucinations. 2. Continue Lamictal 150 mg daily for mood swings. Patient chose to have lithium started rather than increase the Lamictal. States she has been on both at the same time previously. 3.  Continue Zoloft to 100mg  daily for depression 4.. Continue trazodone 100 mg at bedtime for better sleep 5.Continue Lithium 600mg  po at hs for mood stabilization per patient's request. 6. Disposition is in progress patient may be discharged and of the week and she can continue to contract for safety, clinically improves and developed better coping skills.   Medical Decision Making Problem Points:  Established problem, stable/improving (1) and Review of psycho-social stressors (1) Data Points:  Review of medication regiment & side effects (2)  I certify that inpatient services furnished can reasonably be expected to improve the patient's condition.    Malachy Chamber S 06/29/2013 1:50 PM

## 2013-06-29 NOTE — Progress Notes (Signed)
The focus of this group is to help patients review their daily goal of treatment and discuss progress on daily workbooks. Pt attended the evening group session and responded to all discussion prompts from the Writer. Pt shared that today was a good day on the unit, the highlight of which was that she remained positive all day long, even when she felt anxiety. "That was a big deal for me and I hope I can do it again tomorrow." Pt requested new socks, toothpaste and towels, all of which were given to her following group by the Writer. Pt's affect was appropriate and she exhibited humor in the group with her peers.

## 2013-06-29 NOTE — [facility] Group Notes (Signed)
[facility] Group Notes:  (Clinical Social Work)  06/29/2013   3:00-4:00PM  Summary of Progress/Problems:   The main focus of today's process group was for the patient to identify ways in which they have sabotaged their own mental health wellness/recovery.  Motivational interviewing was used to explore the reasons they engage in this behavior, and reasons they may have for wanting to change.   Using a scale of 1-10 (no confidence to complete confidence), the patients were asked to rate their confidence in changing their self-sabotaging behavior.  The patient expressed that she has isolates herself as a means of shielding herself from the anger that people bring out in her.  T/his has resulted in 0 friends and alcohol addiction.  Her confidence in her ability to change is 5, and journaling is helping her a great deal.  She is frightened of the change, however.  Type of Therapy:  Process Group  Participation Level:  Active  Participation Quality:  Attentive and Sharing  Affect:  Blunted  Cognitive:  Alert and Oriented  Insight:  Engaged  Engagement in Therapy:  Engaged  Modes of Intervention:  Education, Motivational Interviewing   Ambrose Mantle, LCSW 06/29/2013, 4:00pm

## 2013-06-29 NOTE — Progress Notes (Signed)
D: Pt denies HI/VH. Pt is pleasant and cooperative. Pt continues to be passive SI- but contracts for safety, +ve for AH, but states they are not as often as before. "trying to be positive"  A: Pt was offered support and encouragement. Pt was given scheduled medications. Pt was encourage to attend groups. Q 15 minute checks were done for safety.   R:Pt attends groups and interacts well with peers and staff. Pt is taking medication. Pt has no complaints at this time.Pt receptive to treatment and safety maintained on unit.

## 2013-06-29 NOTE — Progress Notes (Signed)
D) Pt is doing much better than last week. Affect is spontaneous and will brighten with interaction. Continues to hear voices when she gets anxious and verbalizes a concern about relapsing when she leaves here. Rates her depression at an 8 and her hopelessness at a 9. States that she continues to have thoughts of hurting herself on and off. Pt is journaling her thoughts and concerns on paper. States it is helping her to get back closer to God and that is a positive thing for her. Also verbalized concerns over not having gifts for her children at Christmas. Became tearful. A) Pt given support, reassurance and praise. Encouraged to continue to journal her thoughts and feelings.  R) Pt contracts for her safety while on the unit.

## 2013-06-29 NOTE — Progress Notes (Signed)
Psychoeducational Group Note  Date: 06/29/2013 Time:  1015  Group Topic/Focus:  Identifying Needs:   The focus of this group is to help patients identify their personal needs that have been historically problematic and identify healthy behaviors to address their needs.  Participation Level:  Active  Participation Quality:  Attentive  Affect:  Appropriate  Cognitive:  Oriented  Insight:  Improving  Engagement in Group:  Engaged  Additional Comments:    Fatou Dunnigan A 

## 2013-06-29 NOTE — Progress Notes (Signed)
.  Psychoeducational Group Note    Date: 06/29/2013 Time: 0930   Goal Setting Purpose of Group: To be able to set a goal that is measurable and that can be accomplished in one day Participation Level:  Active  Participation Quality:  Appropriate  Affect:  Appropriate  Cognitive:  Oriented  Insight:  Improving  Engagement in Group:  Engaged  Additional Comments:    Amye Grego A 

## 2013-06-30 DIAGNOSIS — F1220 Cannabis dependence, uncomplicated: Secondary | ICD-10-CM

## 2013-06-30 MED ORDER — HYDROXYZINE HCL 50 MG PO TABS
50.0000 mg | ORAL_TABLET | Freq: Every day | ORAL | Status: DC
  Administered 2013-07-01: 50 mg via ORAL
  Filled 2013-06-30 (×3): qty 1

## 2013-06-30 MED ORDER — HYDROXYZINE HCL 25 MG PO TABS: 25.0000 mg | ORAL_TABLET | Freq: Four times a day (QID) | ORAL | Status: DC | PRN

## 2013-06-30 MED ORDER — HYDROXYZINE HCL 25 MG PO TABS
25.0000 mg | ORAL_TABLET | Freq: Three times a day (TID) | ORAL | Status: DC
  Administered 2013-06-30 – 2013-07-01 (×3): 25 mg via ORAL
  Filled 2013-06-30 (×9): qty 1

## 2013-06-30 MED ORDER — NALTREXONE HCL 50 MG PO TABS
25.0000 mg | ORAL_TABLET | Freq: Every day | ORAL | Status: DC
  Administered 2013-06-30 – 2013-07-01 (×2): 25 mg via ORAL
  Filled 2013-06-30 (×5): qty 1

## 2013-06-30 NOTE — Progress Notes (Signed)
D:  Pt continues to endorse passive SI-but contracts. +vee AH. Pt denies HI/VH. Pt is pleasant and cooperative. Pt stated everything for her is about the same, but now she feels hopeful.   A: Pt was offered support and encouragement. Pt was given scheduled medications. Pt was encourage to attend groups. Q 15 minute checks were done for safety.   R:Pt attends groups and interacts well with peers and staff. Pt is taking medication. Pt has no complaints at this time.Pt receptive to treatment and safety maintained on unit.

## 2013-06-30 NOTE — Progress Notes (Signed)
D) Pt has attended the groups and interacts with her peers. Rats her depression at a 6 and her hopelessness at a 5. States that she continues to SI continuously, but contracts for her safety. Affect is pensive and times and it appears that Pt still hears voices. Pt was in a group this afternoon and another Pt yelled out loud. Pt states this is a trigger for her and it upset her. Pt was shaking and having a difficult time. A) provided Pt with her 5 pm medications, taught Pt deep breathing and asked Pt to lie down on her bed to let the medication work. Given support, reassurance and provided with a 1:1. R) Pt continues to have thoughts of SI, but contracts for her safety on the unit.

## 2013-06-30 NOTE — Progress Notes (Signed)
Psychoeducational Group Note  Date: 06/30/2013 Time:  0930  Group Topic/Focus:  Gratefulness:  The focus of this group is to help patients identify what two things they are most grateful for in their lives. What helps ground them and to center them on their work to their recovery.  Participation Level:  Partisipated  Participation Quality:  Appropriate  Affect:  Appropriate  Cognitive:  Oriented  Insight:  Improving  Engagement in Group:  Engaged  Additional Comments:    Antrone Walla A  

## 2013-06-30 NOTE — Progress Notes (Signed)
Psychoeducational Group Note  Date:  06/30/2013 Time:  1015  Group Topic/Focus:  Making Healthy Choices:   The focus of this group is to help patients identify negative/unhealthy choices they were using prior to admission and identify positive/healthier coping strategies to replace them upon discharge.  Participation Level:  Active  Participation Quality:  Appropriate  Affect:  Appropriate  Cognitive:  Oriented  Insight:  Improving  Engagement in Group:  Engaged  Additional Comments:    Shaylynn Nulty A 06/30/2013 

## 2013-06-30 NOTE — [facility] Group Notes (Signed)
[facility] Group Notes:  (Clinical Social Work)  06/30/2013   1:15-2:20PM  Summary of Progress/Problems:  The main focus of today's process group was to   identify the patient's current support system and decide on other supports that can be put in place.  The picture on workbook was used to discuss why additional supports are needed.  An emphasis was placed on using counselor, doctor, therapy groups, 12-step groups, and problem-specific support groups to expand supports.   There was also an extensive discussion about what constitutes a healthy support versus an unhealthy support.  The patient expressed full comprehension of the concepts presented, and agreed that there is a need to add more supports.  The patient reported a willingness to add AA and NA and a sponsor.  She was a leader in the discussion.  Right now she feels that the professionals in this hospital are her sole support.  Type of Therapy:  Process Group  Participation Level:  Active  Participation Quality:  Attentive and Sharing  Affect:  Appropriate  Cognitive:  Appropriate and Oriented  Insight:  Engaged  Engagement in Therapy:  Engaged  Modes of Intervention:  Education,  Support and [employer], LCSW 06/30/2013, 4:00pm
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
## 2013-07-01 DIAGNOSIS — F1120 Opioid dependence, uncomplicated: Secondary | ICD-10-CM

## 2013-07-01 DIAGNOSIS — F411 Generalized anxiety disorder: Secondary | ICD-10-CM

## 2013-07-01 DIAGNOSIS — F10239 Alcohol dependence with withdrawal, unspecified: Secondary | ICD-10-CM

## 2013-07-01 DIAGNOSIS — F29 Unspecified psychosis not due to a substance or known physiological condition: Secondary | ICD-10-CM

## 2013-07-01 DIAGNOSIS — F1020 Alcohol dependence, uncomplicated: Principal | ICD-10-CM

## 2013-07-01 DIAGNOSIS — F1994 Other psychoactive substance use, unspecified with psychoactive substance-induced mood disorder: Secondary | ICD-10-CM

## 2013-07-01 DIAGNOSIS — F3130 Bipolar disorder, current episode depressed, mild or moderate severity, unspecified: Secondary | ICD-10-CM

## 2013-07-01 DIAGNOSIS — F1910 Other psychoactive substance abuse, uncomplicated: Secondary | ICD-10-CM

## 2013-07-01 MED ORDER — SERTRALINE HCL 100 MG PO TABS: 200.0000 mg | ORAL_TABLET | Freq: Every evening | ORAL | Status: DC

## 2013-07-01 MED ORDER — METOPROLOL SUCCINATE ER 25 MG PO TB24: 25.0000 mg | ORAL_TABLET | Freq: Every morning | ORAL | Status: DC

## 2013-07-01 MED ORDER — TRAZODONE HCL 100 MG PO TABS: 100.0000 mg | ORAL_TABLET | Freq: Every day | ORAL | Status: DC

## 2013-07-01 MED ORDER — HYDROXYZINE HCL 25 MG PO TABS: 25.0000 mg | ORAL_TABLET | Freq: Three times a day (TID) | ORAL | Status: DC

## 2013-07-01 MED ORDER — HYDROXYZINE HCL 50 MG PO TABS: 50.0000 mg | ORAL_TABLET | Freq: Every day | ORAL | Status: DC

## 2013-07-01 MED ORDER — NALTREXONE HCL 50 MG PO TABS: 25.0000 mg | ORAL_TABLET | Freq: Every day | ORAL | Status: DC

## 2013-07-01 MED ORDER — METOPROLOL SUCCINATE ER 25 MG PO TB24
25.0000 mg | ORAL_TABLET | Freq: Every day | ORAL | Status: DC
  Filled 2013-07-01: qty 3

## 2013-07-01 MED ORDER — LITHIUM CARBONATE 300 MG PO CAPS: 900.0000 mg | ORAL_CAPSULE | Freq: Every day | ORAL | Status: DC

## 2013-07-01 MED ORDER — QUETIAPINE FUMARATE 100 MG PO TABS: 100.0000 mg | ORAL_TABLET | Freq: Two times a day (BID) | ORAL | Status: DC

## 2013-07-01 MED ORDER — LAMOTRIGINE 150 MG PO TABS: 150.0000 mg | ORAL_TABLET | Freq: Every day | ORAL | Status: DC

## 2013-07-01 NOTE — [facility] Suicide Risk Assessment (Signed)
Suicide Risk Assessment  Discharge Assessment     Demographic Factors:  Adolescent or young adult and Low socioeconomic status  Mental Status Per Nursing Assessment::   On Admission:  Suicidal ideation indicated by patient  Current Mental Status by Physician: The patient was calm and cooperative. Patient has normal psychomotor activity. Patient has normal speech and thought process. Patient has good mood with appropriate affect. Patient has denied suicide and homicidal ideations, intentions and plans. Patient has no evidence of psychotic symptoms. Patient has fair insight judgment and impulse  Loss Factors: Financial problems/change in socioeconomic status  Historical Factors: Impulsivity  Risk Reduction Factors:   Responsible for children under 62 years of age, Sense of responsibility to family, Religious beliefs about death, Living with another person, especially a relative, Positive social support, Positive therapeutic relationship and Positive coping skills or problem solving skills  Continued Clinical Symptoms:  Bipolar Disorder:   Depressive phase Alcohol/Substance Abuse/Dependencies Previous Psychiatric Diagnoses and Treatments  Cognitive Features That Contribute To Risk:  Polarized thinking    Suicide Risk:  Minimal: No identifiable suicidal ideation.  Patients presenting with no risk factors but with morbid ruminations; may be classified as minimal risk based on the severity of the depressive symptoms  Discharge Diagnoses:   AXIS I:  Bipolar, Depressed, Substance Induced Mood Disorder and Polysubstance dependence AXIS II:  Deferred AXIS III:   Past Medical History  Diagnosis Date  . Hypertension   . Leg pain   . Substance abuse   . Bipolar 1 disorder   . Depression   . Psychosis   . Carpal tunnel syndrome    AXIS IV:  other psychosocial or environmental problems, problems related to social environment and problems with primary support group AXIS V:  51-60  moderate symptoms  Plan Of Care/Follow-up recommendations:  Activity:  As tolerated Diet:  Regular  Is patient on multiple antipsychotic therapies at discharge:  No   Has Patient had three or more failed trials of antipsychotic monotherapy by history:  No  Recommended Plan for Multiple Antipsychotic Therapies: NA  [name],JANARDHAHA R. 07/01/2013, 1:18 PM

## 2013-07-01 NOTE — Progress Notes (Signed)
[facility] Adult Case Management Discharge Plan :  Will you be returning to the same living situation after discharge: Yes,  Patient will return to her home with family. At discharge, do you have transportation home?:Yes,  Patient to arrange for daughter to transport her home. Do you have the ability to pay for your medications:Yes,  Patient has Medicaid.  Release of information consent forms completed and in the chart;  Patient's signature needed at discharge.  Patient to Follow up at: Follow-up Information   Follow up with [facility]  On 07/08/2013. (Please go to [facility]'s walk in clinic on Monday, July 08, 2013 or any weekdday between 8AM - 3PM for medication managment.)    Contact information:   [address] [place], [place]   [address]  [phone]      Follow up with  [facility] On 07/02/2013. (You are scheduled with Norton Blizzard on Tuesday, July 02, 2013 at 11AM)    Contact information:   [address] [place], [place]   [address]  [phone]      Patient denies SI/HI:   Patient no longer endorsing SI/HI or other thoughts of self harm.   Safety Planning and Suicide Prevention discussed: .Reviewed with all patients during discharge planning group   Carin Shipp, [name] 07/01/2013, 10:59 AM

## 2013-07-01 NOTE — Progress Notes (Addendum)
D:  Patient's self inventory sheet, patient has poor sleep and needs sleep medications, improving appetite, normal energy level, improving attention span.  Rated depression 9, hopeless and anxiety 8.  Has dealt with constipated.  Denied SI.  Denied physical problems.  Will try to take care of herself after discharge.  Denied SI.  Still has voices talking bad destructive things to her.  Denied visual hallucinations.  Does have discharge plans.  Needs financial assistance to purchase medications.   A:  Medications administered per MD orders.  Emotional support and encouragement given patient. R:  Denied SI and HI.  Denied visual hallucinations.  Auditory hallucinations continues.  Contracts for safety.  Will continue 15 minute checks for safety.  Safety maintained.

## 2013-07-01 NOTE — Progress Notes (Signed)
Discharge Note:  Patient discharged home with family member.  Patient denied SI and HI.  Denied A/V hallucinations.  Denied pain.  Patient received all her belongings, pocketbook, Pharmacist, community, shoes with strings, clothing, toiletries, miscellaneous items, prescriptions, medications.  Patient was given suicide prevention information which was discussed with her and she stated she understood and had no questions.  Patient complimented staff on their helpfulness and assistance during her stay at [facility].

## 2013-07-01 NOTE — Tx Team (Signed)
Interdisciplinary Treatment Plan Update   Date Reviewed:  07/01/2013  Time Reviewed:  10:12 AM  Progress in Treatment:   Attending groups: Yes Participating in groups: Yes Taking medication as prescribed: Yes  Tolerating medication: Yes Family/Significant other contact made:  Yes, collateral contact made with daughter. Patient understands diagnosis: Yes  Discussing patient identified problems/goals with staff: Yes Medical problems stabilized or resolved: Yes Denies suicidal/homicidal ideation: Yes, patient denies SI/HI. Patient has not harmed self or others: Yes  For review of initial/current patient goals, please see plan of care.  Estimated Length of Stay: Discharge today  Reasons for Continued Hospitalization:    New Problems/Goals identified:    Discharge Plan or Barriers:   Home with outpatient follow up [facility]  Additional Comments:  N/A  Attendees:  Patient:  07/01/2013 10:12 AM   Signature: Mervyn Gay, MD 07/01/2013 10:12 AM  Signature:  Silverio Decamp, NP 07/01/2013 10:12 AM  Signature: Aloha Gell, RN 07/01/2013 10:12 AM   Signature:  Neill Loft,  RN 07/01/2013 10:12 AM  Signature:  Onnie Boer, RN - [facility] 07/01/2013 10:12 AM  Signature:  Juline Patch, LCSW 07/01/2013 10:12 AM  Signature:  Elizbeth Squires, Team Lead [facility] 07/01/2013 10:12 AM  Signature:  Quintella Reichert,  07/01/2013 10:12 AM   07/01/2013 10:12 AM   07/01/2013  10:12 AM   07/01/2013  10:12 AM  Signature:  07/01/2013  10:12 AM    Scribe for Treatment Team:   Lelia Jons,  07/01/2013 10:12 AM

## 2013-07-01 NOTE — Discharge Summary (Signed)
Physician Discharge Summary Note  Patient:  [name] is an 42 y.o., female MRN:  [id number] DOB:  25-Jun-1971 Patient phone:  [phone] (home)  Patient address:   [address] [place] [address],   Date of Admission:  06/21/2013 Date of Discharge: 07/01/2013  Reason for Admission:  Alcohol detox/dependency, psychosis, suicide attempt  Discharge Diagnoses: Principal Problem:   Alcohol dependence Active Problems:   Bipolar I disorder, most recent episode depressed   Polysubstance dependence   Psychosis  Review of Systems  Constitutional: Negative.   HENT: Negative.   Eyes: Negative.   Respiratory: Negative.   Cardiovascular: Negative.   Gastrointestinal: Negative.   Genitourinary: Negative.   Musculoskeletal: Negative.   Skin: Negative.   Neurological: Negative.   Endo/Heme/Allergies: Negative.   Psychiatric/Behavioral: Positive for substance abuse. The patient is nervous/anxious.     DSM5:  Trauma-Stressor Disorders:  Posttraumatic Stress Disorder (309.81) Substance/Addictive Disorders:  Alcohol Related Disorder - Severe (303.90), Alcohol Intoxication with Use Disorder - Severe (F10.229), Alcohol Withdrawal (291.81) and Opioid Disorder - Severe (304.00)  Axis Diagnosis:   AXIS I:  Alcohol Abuse, Anxiety Disorder NOS, Bipolar, Depressed, Psychotic Disorder NOS, Substance Abuse and Substance Induced Mood Disorder AXIS II:  Deferred AXIS III:   Past Medical History  Diagnosis Date  . Hypertension   . Leg pain   . Substance abuse   . Bipolar 1 disorder   . Depression   . Psychosis   . Carpal tunnel syndrome    AXIS IV:  other psychosocial or environmental problems, problems related to social environment and problems with primary support group AXIS V:  61-70 mild symptoms  Level of Care:  OP  Hospital Course:  On admission: 42 year old AAF who was accepted to the adult unit from the ED where she arrived via ambulance with altered mental status. She  states that she was suicidal and had taken pills and drunk alcohol in a suicide attempt. She notes that she has extreme anger and mood swings. She states that she is not on her depression medication because she can not afford it. Her lights have been turned off and she could not get any assistance with getting them turned on through DSS. [name] states that she drinks anything and everything she can get her hands on every day. She has done drugs and takes opiates anything and everything she can get as long as it is free. Her last opiate use was prior to admission. Patient labs were reviewed and her lithium level is sub therapeutic at 0.25 milliequivalents per liter and her blood alcohol level is 96 mg/dL on admission. Patient liver enzymes were within normal range.  During hospitalization:   Medications managed--Librium alcohol detox protocol utilized successfully.  Her blood pressure medications was continued and her Zoloft 200 mg daily for depression.  Her Trazodone 50 - 100 mg PRN sleep was increased to 100 mg at bedtime.  Atarax 25 mg TID for anxiety started and 50 mg at 4-5 am for her increased anxiety at this time.  Lamictal 150 mg at bedtime for bipolar disorder, Lithium 900 mg at bedtime for mood stability, Naltrexone 25 mg daily for opiate cravings, and Seroquel 100 mg BID for psychosis started.  [name] attended and participated in therapy.  She plans to attend AA to maintain her sobriety, journal, and talk to others when she is upset.  She denied suicidal/homicidal ideations and auditory/visual hallucinations, follow-up appointments encouraged to attend, Rx and 14 day supply of medications given.  [name] is mentally and physically stable for discharge.  Consults:  None  Significant Diagnostic Studies:  labs: completed, reviewed, stable  Discharge Vitals:   Blood pressure 118/83, pulse 93, temperature 98.3 F (36.8 C), temperature source Oral, resp. rate 18, height 5' 4.5" (1.638 m), weight  67.586 kg (149 lb), last menstrual period 05/27/2013. Body mass index is 25.19 kg/(m^2). Lab Results:   No results found for this or any previous visit (from the past 72 hour(s)).  Physical Findings: AIMS: Facial and Oral Movements Muscles of Facial Expression: None, normal Lips and Perioral Area: None, normal Jaw: None, normal Tongue: None, normal,Extremity Movements Upper (arms, wrists, hands, fingers): None, normal Lower (legs, knees, ankles, toes): None, normal, Trunk Movements Neck, shoulders, hips: None, normal, Overall Severity Severity of abnormal movements (highest score from questions above): None, normal Incapacitation due to abnormal movements: None, normal Patient's awareness of abnormal movements (rate only patient's report): No Awareness, Dental Status Current problems with teeth and/or dentures?: No Does patient usually wear dentures?: No  CIWA:  CIWA-Ar Total: 1 COWS:  COWS Total Score: 2  Psychiatric Specialty Exam: See Psychiatric Specialty Exam and Suicide Risk Assessment completed by Attending Physician prior to discharge.  Discharge destination:  Home  Is patient on multiple antipsychotic therapies at discharge:  No   Has Patient had three or more failed trials of antipsychotic monotherapy by history:  No  Recommended Plan for Multiple Antipsychotic Therapies: NA  Discharge Orders   Future Appointments Provider Department Dept Phone   07/22/2013 10:30 AM Beverley Fiedler, MD [facility] [phone]   Future Orders Complete By Expires   Activity as tolerated - No restrictions  As directed    Diet - low sodium heart healthy  As directed        Medication List       Indication   hydrOXYzine 25 MG tablet  Commonly known as:  ATARAX/VISTARIL  Take 1 tablet (25 mg total) by mouth 3 (three) times daily.   Indication:  Anxiety Neurosis     hydrOXYzine 50 MG tablet  Commonly known as:  ATARAX/VISTARIL  Take 1 tablet (50 mg total) by  mouth daily.   Indication:  Anxiety Neurosis     lamoTRIgine 150 MG tablet  Commonly known as:  LAMICTAL  Take 1 tablet (150 mg total) by mouth at bedtime.   Indication:  Manic-Depression     lithium carbonate 300 MG capsule  Take 3 capsules (900 mg total) by mouth at bedtime.   Indication:  Manic-Depression     metoprolol succinate 25 MG 24 hr tablet  Commonly known as:  TOPROL-XL  Take 1 tablet (25 mg total) by mouth every morning.   Indication:  High Blood Pressure     naltrexone 50 MG tablet  Commonly known as:  DEPADE  Take 0.5 tablets (25 mg total) by mouth daily.   Indication:  Excessive Use of Alcohol, Opioid Dependence, Posttraumatic Stress Disorder     QUEtiapine 100 MG tablet  Commonly known as:  SEROQUEL  Take 1 tablet (100 mg total) by mouth 2 (two) times daily.   Indication:  Manic Phase of Manic-Depression     sertraline 100 MG tablet  Commonly known as:  ZOLOFT  Take 2 tablets (200 mg total) by mouth every evening.   Indication:  Major Depressive Disorder     traZODone 100 MG tablet  Commonly known as:  DESYREL  Take 1 tablet (100 mg total) by mouth at bedtime.  Indication:  Trouble Sleeping           Follow-up Information   Follow up with [facility]  On 07/08/2013. (Please go to [facility]'s walk in clinic on Monday, July 08, 2013 or any weekdday between 8AM - 3PM for medication managment.)    Contact information:   [address] [place], [place]   [address]  [phone]      Follow up with  [facility] On 07/02/2013. (You are scheduled with Norton Blizzard on Tuesday, July 02, 2013 at 11AM)    Contact information:   [address] [place], [place]   [address]  [phone]      Follow-up recommendations:  Activity:  as tolerated Diet:  low-sodium heart healthy diet  Comments:  Patient will [facility] at [facility] and [facility] with an ACTT team.  Total Discharge Time:  Greater than  30 minutes.  SignedNanine Means, PMH-NP 07/01/2013, 2:35 PM  Patient was seen face-to-face for psychiatric evaluation, suicide risk assessment, case discussed with the physician extender and the treatment team, formulated treatment plan and later made disposition and plan. Reviewed the information documented and agree with the treatment plan.  Garnet Chatmon,JANARDHAHA R. 07/02/2013 1:42 PM

## 2013-07-01 NOTE — Progress Notes (Signed)
D:  Pt endorses Passive SI-contracts for safety, +ve AH.Pt denies HI/VH. Pt is pleasant and cooperative. Pt concerned , because she has not had a bowel movement in a few days. Pt took Mag Citrate last night with no results, and had prune juice before that with no success.    A: Pt was offered support and encouragement. Pt was given scheduled medications. Pt was encourage to attend groups. Q 15 minute checks were done for safety.   R:Pt attends groups and interacts well with peers and staff. Pt is taking medication. Pt has no complaints at this time.Pt receptive to treatment and safety maintained on unit.

## 2013-07-02 NOTE — Progress Notes (Signed)
Patient Discharge Instructions:  After Visit Summary (AVS):   Faxed to:  07/02/13 Discharge Summary Note:   Faxed to:  07/02/13 Psychiatric Admission Assessment Note:   Faxed to:  07/02/13 Suicide Risk Assessment - Discharge Assessment:   Faxed to:  07/02/13 Faxed/Sent to the Next Level Care provider:  07/02/13 Faxed to [facility] @ [phone] Faxed to [facility] @ [phone]  Jerelene Redden, 07/02/2013, 2:32 PM

## 2013-07-18 ENCOUNTER — Encounter: Payer: Self-pay | Admitting: Internal Medicine

## 2013-07-22 ENCOUNTER — Encounter: Payer: Self-pay | Admitting: Internal Medicine

## 2013-07-22 ENCOUNTER — Ambulatory Visit (INDEPENDENT_AMBULATORY_CARE_PROVIDER_SITE_OTHER): Payer: Medicaid Other | Admitting: Internal Medicine

## 2013-07-22 VITALS — BP 122/80 | HR 68 | Ht 64.5 in | Wt 149.2 lb

## 2013-07-22 DIAGNOSIS — Z800 Family history of malignant neoplasm of digestive organs: Secondary | ICD-10-CM

## 2013-07-22 DIAGNOSIS — K5909 Other constipation: Secondary | ICD-10-CM

## 2013-07-22 DIAGNOSIS — K5900 Constipation, unspecified: Secondary | ICD-10-CM

## 2013-07-22 MED ORDER — LUBIPROSTONE 8 MCG PO CAPS: 8.0000 ug | ORAL_CAPSULE | Freq: Two times a day (BID) | ORAL | Status: DC

## 2013-07-22 NOTE — Patient Instructions (Signed)
We have sent the following medications to your pharmacy for you to pick up at your convenience: Zmitiza, please take as directed  You have been scheduled for a colonoscopy with propofol. Please follow written instructions given to you at your visit today.  Please pick up your prep kit at the pharmacy within the next 1-3 days. If you use inhalers (even only as needed), please bring them with you on the day of your procedure. Your physician has requested that you go to www.startemmi.com and enter the access code given to you at your visit today. This web site gives a general overview about your procedure. However, you should still follow specific instructions given to you by our office regarding your preparation for the procedure.                                                We are excited to introduce MyChart, a new best-in-class service that provides you online access to important information in your electronic medical record. We want to make it easier for you to view your health information - all in one secure location - when and where you need it. We expect MyChart will enhance the quality of care and service we provide.  When you register for MyChart, you can:    View your test results.    Request appointments and receive appointment reminders via email.    Request medication renewals.    View your medical history, allergies, medications and immunizations.    Communicate with your physician's office through a password-protected site.    Conveniently print information such as your medication lists.  To find out if MyChart is right for you, please talk to a member of our clinical staff today. We will gladly answer your questions about this free health and wellness tool.  If you are age 45 or older and want a member of your family to have access to your record, you must provide written consent by completing a proxy form available at our office. Please speak to our clinical staff about  guidelines regarding accounts for patients younger than age 45.  As you activate your MyChart account and need any technical assistance, please call the MyChart technical support line at [phone]) or email your question to [email or web address]. If you email your question(s), please include your name, a return phone number and the best time to reach you.  If you have non-urgent health-related questions, you can send a message to our office through [place] at [email or web address]. If you have a medical emergency, call 911.  Thank you for using MyChart as your new health and wellness resource!   MyChart licensed from [employer],  1999-2010. Patents Pending.

## 2013-07-22 NOTE — Progress Notes (Signed)
Patient ID: [name], female   DOB: 1970/10/16, 43 y.o.   MRN: [id number] HPI: Ms. [name] is a 43 year old female with a past medical history of bipolar disorder, substance abuse, hypertension and family history of colon cancer and polyps who is seen in consultation at the request of Dr. Jodi Mourning to evaluate screening colonoscopy and chronic constipation. She is here alone today. She reports she is feeling well today without specific abdominal complaint. She does report long-standing constipation dating back to when she was 43 years old. She reports about laxatives she will have a bowel movement every 2-3 weeks. When she becomes more constipated she developed abdominal bloating and pressure and this makes her quite uncomfortable. Recently she has been using a Dulcolax tablet every 3-4 days which helps. Bowel movement after Dulcolax can be large and associated with lower abdominal cramping. She denies blood in her stool or melena. Occasionally she has darker brown stools.  She reports her father had colon cancer in his 37s. Her mother had colon polyps in her mother's brother had colon cancer, also at an age less than 73 yrs.    She was recently hospitalized in December after a suicide attempt and for alcohol detox. She is no longer drinking alcohol at present and reports a better mood. She is taking her mood stabilizers and antidepressants.  Today she reports no SI/HI and no recent alcohol or illicit drug use.   Past Medical History  Diagnosis Date  . Hypertension   . Leg pain   . Substance abuse   . Bipolar 1 disorder   . Depression   . Psychosis   . Carpal tunnel syndrome     Past Surgical History  Procedure Laterality Date  . Appendectomy    . Tonsillectomy      Current Outpatient Prescriptions  Medication Sig Dispense Refill  . hydrOXYzine (ATARAX/VISTARIL) 25 MG tablet Take 1 tablet (25 mg total) by mouth 3 (three) times daily.  90 tablet  0  . hydrOXYzine  (ATARAX/VISTARIL) 50 MG tablet Take 1 tablet (50 mg total) by mouth daily.  30 tablet  0  . lamoTRIgine (LAMICTAL) 150 MG tablet Take 1 tablet (150 mg total) by mouth at bedtime.  30 tablet  0  . lithium carbonate 300 MG capsule Take 3 capsules (900 mg total) by mouth at bedtime.  90 capsule  0  . metoprolol succinate (TOPROL-XL) 25 MG 24 hr tablet Take 1 tablet (25 mg total) by mouth every morning.  30 tablet  0  . naltrexone (DEPADE) 50 MG tablet Take 0.5 tablets (25 mg total) by mouth daily.  30 tablet  0  . QUEtiapine (SEROQUEL) 100 MG tablet Take 1 tablet (100 mg total) by mouth 2 (two) times daily.  60 tablet  0  . sertraline (ZOLOFT) 100 MG tablet Take 2 tablets (200 mg total) by mouth every evening.  60 tablet  0  . traZODone (DESYREL) 100 MG tablet Take 1 tablet (100 mg total) by mouth at bedtime.  30 tablet  0   No current facility-administered medications for this visit.    Allergies  Allergen Reactions  . Latex Rash    Family History  Problem Relation Age of Onset  . Lung cancer Mother   . Colon cancer Father   . Diabetes Father   . Cancer Maternal Grandmother   . Cancer Maternal Grandfather   . Cancer Paternal Grandmother   . Prostate cancer Father   . Colon polyps Mother  History  Substance Use Topics  . Smoking status: Former Smoker    Quit date: 07/11/1996  . Smokeless tobacco: Never Used  . Alcohol Use: Yes     Comment: 2-3 40 oz daily     ROS: As per history of present illness, otherwise negative  BP 122/80  Pulse 68  Ht 5' 4.5" (1.638 m)  Wt 149 lb 3.2 oz (67.677 kg)  BMI 25.22 kg/m2  LMP 07/22/2013 Constitutional: Well-developed and well-nourished. No distress. HEENT: Normocephalic and atraumatic. Oropharynx is clear and moist. No oropharyngeal exudate. Conjunctivae are normal.  No scleral icterus. Cardiovascular: Normal rate, regular rhythm and intact distal pulses. No M/R/G Pulmonary/chest: Effort normal and breath sounds normal. No wheezing,  rales or rhonchi. Abdominal: Soft, nontender, nondistended. Bowel sounds active throughout.  Extremities: no clubbing, cyanosis, or edema Lymphadenopathy: No cervical adenopathy noted. Neurological: Alert and oriented to person place and time. Skin: Skin is warm and dry. No rashes noted. Psychiatric: Normal mood and affect. Behavior is normal.  RELEVANT LABS AND IMAGING: CBC    Component Value Date/Time   WBC 4.3 06/20/2013 2127   RBC 3.92 06/20/2013 2127   HGB 11.8* 06/20/2013 2127   HCT 33.6* 06/20/2013 2127   PLT 272 06/20/2013 2127   MCV 85.7 06/20/2013 2127   MCH 30.1 06/20/2013 2127   MCHC 35.1 06/20/2013 2127   RDW 13.1 06/20/2013 2127   LYMPHSABS 1.9 06/03/2013 1638   MONOABS 0.4 06/03/2013 1638   EOSABS 0.1 06/03/2013 1638   BASOSABS 0.0 06/03/2013 1638    CMP     Component Value Date/Time   NA 140 06/20/2013 2127   K 4.2 06/20/2013 2127   CL 108 06/20/2013 2127   CO2 22 06/20/2013 2127   GLUCOSE 81 06/20/2013 2127   BUN 5* 06/20/2013 2127   CREATININE 0.61 06/20/2013 2127   CREATININE 0.69 06/03/2013 1638   CALCIUM 8.8 06/20/2013 2127   PROT 7.4 06/20/2013 2127   ALBUMIN 3.3* 06/20/2013 2127   AST 29 06/20/2013 2127   ALT 12 06/20/2013 2127   ALKPHOS 41 06/20/2013 2127   BILITOT 0.6 06/20/2013 2127   GFRNONAA >90 06/20/2013 2127   GFRAA >90 06/20/2013 2127    ASSESSMENT/PLAN: 43 year old female with a past medical history of bipolar disorder, substance abuse, hypertension and family history of colon cancer and polyps who is seen in consultation at the request of Dr. Jodi Mourning to evaluate screening colonoscopy and chronic constipation.   1.  High risk CRC screening/family history of colon cancer and polyps in multiple family members -- given the fact that her mother had colon polyps, her father had colon cancer, and her maternal uncle had colon cancer, I recommend proceeding to colonoscopy at this time for screening. We discussed the test today including the  risks and benefits and she is agreeable to proceed. Her screening interval would be every 5 years unless she is found to have multiple polyps, at which point he would likely be less than every 5 years. I would like to address her constipation before colonoscopy, see #2   2.  Chronic constipation  -- long standing on intermittent over-the-counter laxatives. I recommended lubiprostone 8 mcg twice daily. We discussed his main side effect of diarrhea and I asked that she notify me if this occurs. We also discussed that she may need a higher dose, and that she should contact me if medication fails to improve her constipation significantly.   Further recommendations after colonoscopy

## 2013-07-23 ENCOUNTER — Ambulatory Visit: Payer: Medicaid Other | Admitting: Obstetrics

## 2013-07-23 ENCOUNTER — Other Ambulatory Visit: Payer: Medicaid Other

## 2013-08-02 ENCOUNTER — Emergency Department (HOSPITAL_COMMUNITY)
Admission: EM | Admit: 2013-08-02 | Discharge: 2013-08-02 | Disposition: A | Discharge status: Other Acute Inpatient Hospital | Payer: Medicaid Other | Attending: Emergency Medicine | Admitting: Emergency Medicine

## 2013-08-02 ENCOUNTER — Encounter (HOSPITAL_COMMUNITY): Payer: Self-pay | Admitting: Emergency Medicine

## 2013-08-02 DIAGNOSIS — Z8669 Personal history of other diseases of the nervous system and sense organs: Secondary | ICD-10-CM | POA: Insufficient documentation

## 2013-08-02 DIAGNOSIS — Z3202 Encounter for pregnancy test, result negative: Secondary | ICD-10-CM | POA: Insufficient documentation

## 2013-08-02 DIAGNOSIS — F1210 Cannabis abuse, uncomplicated: Secondary | ICD-10-CM | POA: Insufficient documentation

## 2013-08-02 DIAGNOSIS — F319 Bipolar disorder, unspecified: Secondary | ICD-10-CM | POA: Insufficient documentation

## 2013-08-02 DIAGNOSIS — I10 Essential (primary) hypertension: Secondary | ICD-10-CM | POA: Insufficient documentation

## 2013-08-02 DIAGNOSIS — Z87891 Personal history of nicotine dependence: Secondary | ICD-10-CM | POA: Insufficient documentation

## 2013-08-02 DIAGNOSIS — IMO0002 Reserved for concepts with insufficient information to code with codable children: Secondary | ICD-10-CM | POA: Insufficient documentation

## 2013-08-02 DIAGNOSIS — Z8739 Personal history of other diseases of the musculoskeletal system and connective tissue: Secondary | ICD-10-CM | POA: Insufficient documentation

## 2013-08-02 DIAGNOSIS — F1010 Alcohol abuse, uncomplicated: Secondary | ICD-10-CM | POA: Insufficient documentation

## 2013-08-02 DIAGNOSIS — Z008 Encounter for other general examination: Secondary | ICD-10-CM

## 2013-08-02 DIAGNOSIS — F29 Unspecified psychosis not due to a substance or known physiological condition: Secondary | ICD-10-CM | POA: Insufficient documentation

## 2013-08-02 DIAGNOSIS — Z91040 Latex allergy status: Secondary | ICD-10-CM | POA: Insufficient documentation

## 2013-08-02 DIAGNOSIS — Z046 Encounter for general psychiatric examination, requested by authority: Secondary | ICD-10-CM | POA: Insufficient documentation

## 2013-08-02 LAB — COMPREHENSIVE METABOLIC PANEL
ALBUMIN: 3.7 g/dL (ref 3.5–5.2)
ALK PHOS: 44 U/L (ref 39–117)
ALT: 14 U/L (ref 0–35)
AST: 39 U/L — AB (ref 0–37)
BUN: 3 mg/dL — AB (ref 6–23)
CO2: 25 mEq/L (ref 19–32)
Calcium: 9 mg/dL (ref 8.4–10.5)
Chloride: 97 mEq/L (ref 96–112)
Creatinine, Ser: 0.69 mg/dL (ref 0.50–1.10)
GFR calc Af Amer: >90 mL/min (ref >90)
GFR calc non Af Amer: >90 mL/min (ref >90)
Glucose, Bld: 98 mg/dL (ref 70–99)
Potassium: 3.4 mEq/L — ABNORMAL LOW (ref 3.7–5.3)
Sodium: 137 mEq/L (ref 137–147)
TOTAL PROTEIN: 8 g/dL (ref 6.0–8.3)
Total Bilirubin: 0.3 mg/dL (ref 0.3–1.2)

## 2013-08-02 LAB — POCT PREGNANCY, URINE: PREG TEST UR: NEGATIVE

## 2013-08-02 LAB — BASIC METABOLIC PANEL
BUN: 3 mg/dL — ABNORMAL LOW (ref 6–23)
CALCIUM: 9 mg/dL (ref 8.4–10.5)
CO2: 26 mEq/L (ref 19–32)
CREATININE: 0.69 mg/dL (ref 0.50–1.10)
Chloride: 98 mEq/L (ref 96–112)
GFR calc non Af Amer: >90 mL/min (ref >90)
Glucose, Bld: 101 mg/dL — ABNORMAL HIGH (ref 70–99)
Potassium: 3.4 mEq/L — ABNORMAL LOW (ref 3.7–5.3)
Sodium: 138 mEq/L (ref 137–147)

## 2013-08-02 LAB — URINALYSIS, ROUTINE W REFLEX MICROSCOPIC
Glucose, UA: NEGATIVE mg/dL
Hgb urine dipstick: NEGATIVE
Ketones, ur: 15 mg/dL — AB
Nitrite: NEGATIVE
PH: 6 (ref 5.0–8.0)
Protein, ur: NEGATIVE mg/dL
SPECIFIC GRAVITY, URINE: 1.012 (ref 1.005–1.030)
Urobilinogen, UA: 0.2 mg/dL (ref 0.0–1.0)

## 2013-08-02 LAB — CBC WITH DIFFERENTIAL/PLATELET
BASOS PCT: 1 % (ref 0–1)
Basophils Absolute: 0 10*3/uL (ref 0.0–0.1)
EOS PCT: 1 % (ref 0–5)
Eosinophils Absolute: 0 10*3/uL (ref 0.0–0.7)
HEMATOCRIT: 34.8 % — AB (ref 36.0–46.0)
Hemoglobin: 11.9 g/dL — ABNORMAL LOW (ref 12.0–15.0)
Lymphocytes Relative: 51 % — ABNORMAL HIGH (ref 12–46)
Lymphs Abs: 2.2 10*3/uL (ref 0.7–4.0)
MCH: 29.5 pg (ref 26.0–34.0)
MCHC: 34.2 g/dL (ref 30.0–36.0)
MCV: 86.1 fL (ref 78.0–100.0)
MONO ABS: 0.3 10*3/uL (ref 0.1–1.0)
Monocytes Relative: 8 % (ref 3–12)
NEUTROS ABS: 1.7 10*3/uL (ref 1.7–7.7)
Neutrophils Relative %: 40 % — ABNORMAL LOW (ref 43–77)
Platelets: 283 10*3/uL (ref 150–400)
RBC: 4.04 MIL/uL (ref 3.87–5.11)
RDW: 14.5 % (ref 11.5–15.5)
WBC: 4.2 10*3/uL (ref 4.0–10.5)

## 2013-08-02 LAB — ETHANOL: Alcohol, Ethyl (B): <11 mg/dL (ref 0–11)

## 2013-08-02 LAB — URINE MICROSCOPIC-ADD ON

## 2013-08-02 LAB — RAPID URINE DRUG SCREEN, HOSP PERFORMED
Amphetamines: NOT DETECTED
Barbiturates: NOT DETECTED
Benzodiazepines: NOT DETECTED
Cocaine: NOT DETECTED
OPIATES: NOT DETECTED
Tetrahydrocannabinol: NOT DETECTED

## 2013-08-02 LAB — LITHIUM LEVEL

## 2013-08-02 NOTE — ED Notes (Signed)
Per paperwork from [place], Pt is confused, disoriented x 3, psychotic, and mute.  Pt has been off meds and abusing alcohol.

## 2013-08-02 NOTE — ED Notes (Signed)
Pt discharged back to [facility].  Report called.

## 2013-08-02 NOTE — ED Notes (Signed)
Per Police, pt has been at [facility] since yesterday-pt will not answer questions

## 2013-08-02 NOTE — Discharge Instructions (Signed)
Medically cleared to return to [facility].  Medical Screening Exam A medical screening exam has been done. This exam helps find the cause of your problem and determines whether you need emergency treatment. Your exam has shown that you do not need emergency treatment at this point. It is safe for you to go to your caregiver's office or clinic for treatment. You should make an appointment today to see your caregiver as soon as he or she is available. Depending on your illness, your symptoms and condition can change over time. If your condition gets worse or you develop new or troubling symptoms before you see your caregiver, you should return to the emergency department for further evaluation.  Document Released: 08/04/2004 Document Revised: 09/19/2011 Document Reviewed: 03/16/2011 [facility] Patient Information 2014 [place], [place].

## 2013-08-02 NOTE — ED Provider Notes (Signed)
Medical screening examination/treatment/procedure(s) were performed by non-physician practitioner and as supervising physician I was immediately available for consultation/collaboration.  EKG Interpretation    Date/Time:  Friday August 02 2013 16:49:21 EST Ventricular Rate:  71 PR Interval:  132 QRS Duration: 90 QT Interval:  386 QTC Calculation: 419 R Axis:   48 Text Interpretation:  Sinus rhythm Probable anteroseptal infarct, old Borderline T abnormalities, inferior leads No significant change since last tracing Confirmed by YAO  MD, DAVID [phone]) on 08/02/2013 4:57:22 PM              Wandra Arthurs, MD 08/02/13 2316

## 2013-08-02 NOTE — ED Provider Notes (Signed)
CSN: [id number]     Arrival date & time 08/02/13  1534 History   First MD Initiated Contact with Patient 08/02/13 1621     Chief Complaint  Patient presents with  . Medical Clearance   (Consider location/radiation/quality/duration/timing/severity/associated sxs/prior Treatment) HPI Comments: Patient is a 43 year old female past medical history of bipolar, psychosis and substance abuse who presents to the emergency department via police from [place] for medical clearance to return back to [place]. Patient is reluctant to answer any questions and does not provide much information for the history, level V caveat due to psychosis. Patient went to [facility] last night for her regular appointment, according to paperwork she was acting strange, psychotic and mute. She has not been taking her medications. She has been abusing alcohol. Patient denies suicidal or homicidal ideations.  The history is provided by the patient and the police.    Past Medical History  Diagnosis Date  . Hypertension   . Leg pain   . Substance abuse   . Bipolar 1 disorder   . Depression   . Psychosis   . Carpal tunnel syndrome    Past Surgical History  Procedure Laterality Date  . Appendectomy    . Tonsillectomy     Family History  Problem Relation Age of Onset  . Lung cancer Mother   . Colon cancer Father   . Diabetes Father   . Cancer Maternal Grandmother   . Cancer Maternal Grandfather   . Cancer Paternal Grandmother   . Prostate cancer Father   . Colon polyps Mother    History  Substance Use Topics  . Smoking status: Former Smoker    Quit date: 07/11/1996  . Smokeless tobacco: Never Used  . Alcohol Use: Yes     Comment: 2-3 40 oz daily    OB History   Grav Para Term Preterm Abortions TAB SAB Ect Mult Living   8 7 6 1 1 1    7      Review of Systems  Unable to perform ROS: Psychiatric disorder    Allergies  Latex  Home Medications   Current Outpatient Rx  Name  Route  Sig  Dispense   Refill  . acetaminophen (TYLENOL) 500 MG tablet   Oral   Take 1,000 mg by mouth every 4 (four) hours as needed (fever greater than 100.4).         Marland Kitchen alum & mag hydroxide-simeth (MAALOX/MYLANTA) 200-200-20 MG/5ML suspension   Oral   Take 30 mLs by mouth every 4 (four) hours as needed for heartburn (gastric discomfort).         . bismuth subsalicylate (PEPTO BISMOL) 262 MG/15ML suspension   Oral   Take 5 mLs by mouth 2 (two) times daily as needed (upset stomach).         . dicyclomine (BENTYL) 10 MG capsule   Oral   Take 10 mg by mouth 4 (four) times daily -  before meals and at bedtime. As needed for abdominal cramping         . docusate sodium (COLACE) 100 MG capsule   Oral   Take 100 mg by mouth 2 (two) times daily as needed for mild constipation.         . magnesium hydroxide (MILK OF MAGNESIA) 400 MG/5ML suspension   Oral   Take 30 mLs by mouth 2 (two) times daily as needed for mild constipation.         . hydrOXYzine (ATARAX/VISTARIL) 50 MG tablet   Oral  Take 1 tablet (50 mg total) by mouth daily.   30 tablet   0   . lithium carbonate 300 MG capsule   Oral   Take 3 capsules (900 mg total) by mouth at bedtime.   90 capsule   0   . QUEtiapine (SEROQUEL) 100 MG tablet   Oral   Take 1 tablet (100 mg total) by mouth 2 (two) times daily.   60 tablet   0   . traZODone (DESYREL) 100 MG tablet   Oral   Take 1 tablet (100 mg total) by mouth at bedtime.   30 tablet   0    BP 162/92  Pulse 86  Temp(Src) 98.2 F (36.8 C) (Oral)  Resp 18  SpO2 100%  LMP 07/22/2013 Physical Exam  Nursing note and vitals reviewed. Constitutional: She is oriented to person, place, and time. She appears well-developed and well-nourished. No distress.  HENT:  Head: Normocephalic and atraumatic.  Mouth/Throat: Oropharynx is clear and moist.  Eyes: Conjunctivae are normal.  Neck: Normal range of motion. Neck supple.  Cardiovascular: Normal rate, regular rhythm and normal  heart sounds.   Pulmonary/Chest: Effort normal and breath sounds normal.  Musculoskeletal: Normal range of motion. She exhibits no edema.  Neurological: She is alert and oriented to person, place, and time.  Skin: Skin is warm and dry. She is not diaphoretic.  Psychiatric: Her speech is delayed. She is agitated and withdrawn.  Poor eye contact, flat affect. She is inattentive.    ED Course  Procedures (including critical care time) Labs Review Labs Reviewed  CBC WITH DIFFERENTIAL - Abnormal; Notable for the following:    Hemoglobin 11.9 (*)    HCT 34.8 (*)    Neutrophils Relative % 40 (*)    Lymphocytes Relative 51 (*)    All other components within normal limits  BASIC METABOLIC PANEL - Abnormal; Notable for the following:    Potassium 3.4 (*)    Glucose, Bld 101 (*)    BUN 3 (*)    All other components within normal limits  URINALYSIS, ROUTINE W REFLEX MICROSCOPIC - Abnormal; Notable for the following:    APPearance CLOUDY (*)    Bilirubin Urine SMALL (*)    Ketones, ur 15 (*)    Leukocytes, UA MODERATE (*)    All other components within normal limits  LITHIUM LEVEL - Abnormal; Notable for the following:    Lithium Lvl <0.25 (*)    All other components within normal limits  COMPREHENSIVE METABOLIC PANEL - Abnormal; Notable for the following:    Potassium 3.4 (*)    BUN 3 (*)    AST 39 (*)    All other components within normal limits  URINE MICROSCOPIC-ADD ON - Abnormal; Notable for the following:    Squamous Epithelial / LPF FEW (*)    All other components within normal limits  ETHANOL  URINE RAPID DRUG SCREEN (HOSP PERFORMED)  POCT PREGNANCY, URINE   Imaging Review No results found.  EKG Interpretation    Date/Time:  Friday August 02 2013 16:49:21 EST Ventricular Rate:  71 PR Interval:  132 QRS Duration: 90 QT Interval:  386 QTC Calculation: 419 R Axis:   48 Text Interpretation:  Sinus rhythm Probable anteroseptal infarct, old Borderline T abnormalities,  inferior leads No significant change since last tracing Confirmed by YAO  MD, DAVID [phone]) on 08/02/2013 4:57:22 PM            MDM   1. Medical clearance for  psychiatric admission     Patient presenting from [facility] for medical clearance to return back to [facility]. She is well appearing and in no apparent distress, stable vital signs. Labs pending requested from [facility], CBC, CMP, UA, UDS, EtOH, lithium, EKG. 6:02 PM Pt medically cleared to return to [facility]. She has not been taking her medications, lithium level <0.25.  Illene Labrador, PA-C 08/02/13 1802

## 2013-08-08 ENCOUNTER — Ambulatory Visit: Payer: Medicaid Other | Admitting: Internal Medicine

## 2013-08-08 NOTE — Progress Notes (Signed)
Called to room to assist during endoscopic procedure.  Patient ID and intended procedure confirmed with present staff. Received instructions for my participation in the procedure from the performing physician.  

## 2013-08-09 ENCOUNTER — Inpatient Hospital Stay (HOSPITAL_COMMUNITY)
Admission: AD | Admit: 2013-08-09 | Discharge: 2013-08-19 | DRG: 885 | Disposition: A | Discharge status: Other Acute Inpatient Hospital | Payer: Medicaid Other | Attending: Psychiatry | Admitting: Psychiatry

## 2013-08-09 ENCOUNTER — Encounter (HOSPITAL_COMMUNITY): Payer: Self-pay | Admitting: *Deleted

## 2013-08-09 DIAGNOSIS — F323 Major depressive disorder, single episode, severe with psychotic features: Secondary | ICD-10-CM | POA: Diagnosis present

## 2013-08-09 DIAGNOSIS — G4700 Insomnia, unspecified: Secondary | ICD-10-CM | POA: Diagnosis present

## 2013-08-09 DIAGNOSIS — Z5987 Material hardship due to limited financial resources, not elsewhere classified: Secondary | ICD-10-CM

## 2013-08-09 DIAGNOSIS — F411 Generalized anxiety disorder: Secondary | ICD-10-CM | POA: Diagnosis present

## 2013-08-09 DIAGNOSIS — F1020 Alcohol dependence, uncomplicated: Secondary | ICD-10-CM | POA: Diagnosis present

## 2013-08-09 DIAGNOSIS — F1910 Other psychoactive substance abuse, uncomplicated: Secondary | ICD-10-CM | POA: Diagnosis present

## 2013-08-09 DIAGNOSIS — Z87891 Personal history of nicotine dependence: Secondary | ICD-10-CM

## 2013-08-09 DIAGNOSIS — R630 Anorexia: Secondary | ICD-10-CM | POA: Diagnosis present

## 2013-08-09 DIAGNOSIS — I10 Essential (primary) hypertension: Secondary | ICD-10-CM | POA: Diagnosis present

## 2013-08-09 DIAGNOSIS — F3130 Bipolar disorder, current episode depressed, mild or moderate severity, unspecified: Principal | ICD-10-CM | POA: Diagnosis present

## 2013-08-09 DIAGNOSIS — Z598 Other problems related to housing and economic circumstances: Secondary | ICD-10-CM

## 2013-08-09 DIAGNOSIS — R45851 Suicidal ideations: Secondary | ICD-10-CM

## 2013-08-09 DIAGNOSIS — F1920 Other psychoactive substance dependence, uncomplicated: Secondary | ICD-10-CM

## 2013-08-09 NOTE — Addendum Note (Signed)
Addended by: Ernestine Conrad D on: 08/09/2013 02:57 PM   Modules accepted: Orders
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
## 2013-08-10 ENCOUNTER — Encounter (HOSPITAL_COMMUNITY): Payer: Self-pay | Admitting: *Deleted

## 2013-08-10 DIAGNOSIS — F22 Delusional disorders: Secondary | ICD-10-CM

## 2013-08-10 DIAGNOSIS — F1994 Other psychoactive substance use, unspecified with psychoactive substance-induced mood disorder: Secondary | ICD-10-CM

## 2013-08-10 DIAGNOSIS — F1020 Alcohol dependence, uncomplicated: Secondary | ICD-10-CM

## 2013-08-10 DIAGNOSIS — F3130 Bipolar disorder, current episode depressed, mild or moderate severity, unspecified: Principal | ICD-10-CM

## 2013-08-10 DIAGNOSIS — F323 Major depressive disorder, single episode, severe with psychotic features: Secondary | ICD-10-CM | POA: Diagnosis present

## 2013-08-10 LAB — CBC
HCT: 38.4 % (ref 36.0–46.0)
Hemoglobin: 13.2 g/dL (ref 12.0–15.0)
MCH: 30 pg (ref 26.0–34.0)
MCHC: 34.4 g/dL (ref 30.0–36.0)
MCV: 87.3 fL (ref 78.0–100.0)
PLATELETS: 303 10*3/uL (ref 150–400)
RBC: 4.4 MIL/uL (ref 3.87–5.11)
RDW: 14 % (ref 11.5–15.5)
WBC: 5.5 10*3/uL (ref 4.0–10.5)

## 2013-08-10 LAB — COMPREHENSIVE METABOLIC PANEL
ALBUMIN: 3.7 g/dL (ref 3.5–5.2)
ALT: 13 U/L (ref 0–35)
AST: 25 U/L (ref 0–37)
Alkaline Phosphatase: 41 U/L (ref 39–117)
BUN: 5 mg/dL — ABNORMAL LOW (ref 6–23)
CALCIUM: 9.9 mg/dL (ref 8.4–10.5)
CO2: 25 mEq/L (ref 19–32)
Chloride: 100 mEq/L (ref 96–112)
Creatinine, Ser: 1.05 mg/dL (ref 0.50–1.10)
GFR calc Af Amer: 74 mL/min — ABNORMAL LOW (ref >90)
GFR calc non Af Amer: 64 mL/min — ABNORMAL LOW (ref >90)
Glucose, Bld: 108 mg/dL — ABNORMAL HIGH (ref 70–99)
Potassium: 4.2 mEq/L (ref 3.7–5.3)
Sodium: 135 mEq/L — ABNORMAL LOW (ref 137–147)
Total Bilirubin: <0.2 mg/dL — ABNORMAL LOW (ref 0.3–1.2)
Total Protein: 8.1 g/dL (ref 6.0–8.3)

## 2013-08-10 LAB — URINALYSIS, ROUTINE W REFLEX MICROSCOPIC
Bilirubin Urine: NEGATIVE
GLUCOSE, UA: NEGATIVE mg/dL
HGB URINE DIPSTICK: NEGATIVE
Ketones, ur: NEGATIVE mg/dL
LEUKOCYTES UA: NEGATIVE
Nitrite: NEGATIVE
Protein, ur: NEGATIVE mg/dL
SPECIFIC GRAVITY, URINE: 1.009 (ref 1.005–1.030)
UROBILINOGEN UA: 0.2 mg/dL (ref 0.0–1.0)
pH: 8.5 — ABNORMAL HIGH (ref 5.0–8.0)

## 2013-08-10 LAB — LITHIUM LEVEL: Lithium Lvl: 1.28 mEq/L (ref 0.80–1.40)

## 2013-08-10 LAB — ACETAMINOPHEN LEVEL

## 2013-08-10 LAB — RAPID URINE DRUG SCREEN, HOSP PERFORMED
AMPHETAMINES: NOT DETECTED
Barbiturates: NOT DETECTED
Benzodiazepines: NOT DETECTED
Cocaine: NOT DETECTED
OPIATES: NOT DETECTED
Tetrahydrocannabinol: NOT DETECTED

## 2013-08-10 LAB — SALICYLATE LEVEL: Salicylate Lvl: <2 mg/dL — ABNORMAL LOW (ref 2.8–20.0)

## 2013-08-10 LAB — ETHANOL

## 2013-08-10 LAB — PREGNANCY, URINE: Preg Test, Ur: NEGATIVE

## 2013-08-10 MED ORDER — GABAPENTIN 100 MG PO CAPS
100.0000 mg | ORAL_CAPSULE | Freq: Two times a day (BID) | ORAL | Status: DC
  Administered 2013-08-10 – 2013-08-19 (×19): 100 mg via ORAL
  Filled 2013-08-10 (×23): qty 1

## 2013-08-10 MED ORDER — QUETIAPINE FUMARATE 100 MG PO TABS
100.0000 mg | ORAL_TABLET | Freq: Every day | ORAL | Status: DC
  Filled 2013-08-10 (×2): qty 1

## 2013-08-10 MED ORDER — LUBIPROSTONE 8 MCG PO CAPS
8.0000 ug | ORAL_CAPSULE | Freq: Two times a day (BID) | ORAL | Status: DC
  Administered 2013-08-10 – 2013-08-19 (×19): 8 ug via ORAL
  Filled 2013-08-10 (×21): qty 1

## 2013-08-10 MED ORDER — LITHIUM CARBONATE 300 MG PO CAPS
900.0000 mg | ORAL_CAPSULE | Freq: Every day | ORAL | Status: DC
  Administered 2013-08-10 – 2013-08-18 (×9): 900 mg via ORAL
  Filled 2013-08-10 (×11): qty 3

## 2013-08-10 MED ORDER — SERTRALINE HCL 100 MG PO TABS
100.0000 mg | ORAL_TABLET | Freq: Every day | ORAL | Status: DC
  Filled 2013-08-10 (×2): qty 1

## 2013-08-10 MED ORDER — ACETAMINOPHEN 325 MG PO TABS
650.0000 mg | ORAL_TABLET | Freq: Four times a day (QID) | ORAL | Status: DC | PRN
  Administered 2013-08-11 – 2013-08-12 (×2): 650 mg via ORAL
  Filled 2013-08-10 (×2): qty 2

## 2013-08-10 MED ORDER — TRAZODONE HCL 50 MG PO TABS
50.0000 mg | ORAL_TABLET | Freq: Once | ORAL | Status: AC
  Administered 2013-08-10: 50 mg via ORAL
  Filled 2013-08-10 (×2): qty 1

## 2013-08-10 MED ORDER — SERTRALINE HCL 100 MG PO TABS
200.0000 mg | ORAL_TABLET | Freq: Every day | ORAL | Status: DC
  Administered 2013-08-10 – 2013-08-19 (×10): 200 mg via ORAL
  Filled 2013-08-10 (×11): qty 2

## 2013-08-10 MED ORDER — LAMOTRIGINE 25 MG PO TABS
225.0000 mg | ORAL_TABLET | Freq: Every day | ORAL | Status: DC
  Administered 2013-08-10 – 2013-08-19 (×10): 225 mg via ORAL
  Filled 2013-08-10 (×11): qty 1

## 2013-08-10 MED ORDER — QUETIAPINE FUMARATE 100 MG PO TABS
100.0000 mg | ORAL_TABLET | Freq: Two times a day (BID) | ORAL | Status: DC
  Administered 2013-08-10 – 2013-08-12 (×4): 100 mg via ORAL
  Filled 2013-08-10 (×7): qty 1

## 2013-08-10 MED ORDER — TRAZODONE HCL 50 MG PO TABS
50.0000 mg | ORAL_TABLET | Freq: Every evening | ORAL | Status: DC | PRN
  Administered 2013-08-10: 50 mg via ORAL
  Filled 2013-08-10: qty 1

## 2013-08-10 MED ORDER — ALUM & MAG HYDROXIDE-SIMETH 200-200-20 MG/5ML PO SUSP: 30.0000 mL | ORAL | Status: DC | PRN

## 2013-08-10 MED ORDER — MAGNESIUM HYDROXIDE 400 MG/5ML PO SUSP
30.0000 mL | Freq: Every day | ORAL | Status: DC | PRN
Start: 2013-08-10 — End: 2013-08-19
  Administered 2013-08-18 (×2): 30 mL via ORAL

## 2013-08-10 NOTE — Progress Notes (Signed)
Patient is refusing all am medications due to "dosages are wrong" and states she will take them "when they're right".  Explained all scheduled medications with dosages and encouraged compliance.  Informed MD and will follow up.  Conmtinue to asses and 15' checks continued for safety.

## 2013-08-10 NOTE — [facility] Group Notes (Signed)
[place] Group Notes:  (Nursing/MHT/Case Management/Adjunct)  Date:  08/10/2013  Time:  0930  Type of Therapy:  Nurse Education  Participation Level:  Minimal  Participation Quality:  Attentive  Affect:  Anxious  Cognitive:  Alert  Insight:  Lacking  Engagement in Group:  Limited  Modes of Intervention:  Education and Support  Summary of Progress/Problems:Was pulled out by SW for majority of group.  Journal and instruction given.   Jobe Igo 08/10/2013, 10:31 AM

## 2013-08-10 NOTE — Progress Notes (Signed)
This is a 43 years old African American female admitted to the unit for major Depressive d/o with psychotic features, Patient reported that long history of alcohol abuse. She stated that she has been drinking since she was 43 years old and drinks up to a case of beer and a bottle of liquor daily. She said her psychiatric was concern about her and involuntarily committed her. She reported that she has been in [facility] since 08/02/13 and has already completed her alcohol detox. She also stated that she hear voices when she drinks, some suicide thoughts; none during the assessment. She said her mother died 2013-01-11 and that is a stressor to her. She also stated that she has 8 children; the oldest is 11 years old and the youngest is 46. Her thought process, and skin assessment within normal limit. CI WA was zero and no signs withdrawals noted. Writer encouraged and supported patient. Q 15 minute check initiated. She appeared somewhat drowsy because she already received her HS medications at [facility] before transported to [facility]. Patient cooperative and polite throughout this assessment.

## 2013-08-10 NOTE — Progress Notes (Signed)
The focus of this group is to help patients review their daily goal of treatment and discuss progress on daily workbooks. Pt attended the evening group session and responded to all discussion prompts from the [place]. Pt shared that today was a good day on the unit, the highlight of which was feeling like recent changes in her medications were helping her. Pt shared that her goal for the upcoming week and beyond was to "do things the right way this time," which included listening to feedback from others about her state and well being. Pt's additional needs from Nursing Staff for the evening included towels and socks, which were given to her following group. Pt's affect was appropriate.
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
## 2013-08-10 NOTE — H&P (Signed)
Psychiatric Admission Assessment Adult  Patient Identification:  [name] Date of Evaluation:  08/10/2013 Chief Complaint:  MAJOR DEPRESSIVE DISORDER WITH PSYCHOTIC FEATURES History of Present Illness:: Admitted from [facility] with IVC as she presented with depressive symptoms, paranoia and hallucinations. Says she has had detox off alcohol and was waiting for a bed at [facility] for 30 days recovery. She has been detoxed but they were worried about her depression. Endorses feeling down, impulsive and withdrawn. Endorses hearing voices but not commanding. Denies visual hallucinations. Denies any withdrawal symptoms. Remains paranoid and asked questions about her medications and concerned about the doses. Guarded regarded her history.  Family History: Mom diagnosed with Bipolar. Social HIstory: Has 8 kids according to her. Imagene Sheller are 30 and 45 years of age and being taken care by their 64yr old sibling and patients mom.  Elements:  Location:  depression and hallucinations. Quality:  moderate. Severity:  hallucinations periodic but not commanding. Timing:  episodic. Associated Signs/Synptoms: Depression Symptoms:  depressed mood, anhedonia, feelings of worthlessness/guilt, suicidal thoughts without plan, insomnia, disturbed sleep, decreased appetite, (Hypo) Manic Symptoms:  Distractibility, Hallucinations, Irritable Mood, Anxiety Symptoms:  Excessive Worry, Psychotic Symptoms:  Hallucinations: Auditory PTSD Symptoms: Negative Total Time spent with patient: 1 hour  Psychiatric Specialty Exam: Physical Exam  Nursing note and vitals reviewed. Constitutional: Vital signs are normal.  HENT:  Head: Normocephalic.  Neck: No mass and no thyromegaly present.  Respiratory: Effort normal.  Neurological: She is alert.  Skin: Skin is warm, dry and intact.  Psychiatric: She is slowed. Thought content is paranoid and delusional.    Review of Systems  Constitutional: Negative.  Negative  for fever and chills.  HENT: Negative.   Eyes: Negative.   Gastrointestinal: Negative.   Genitourinary: Negative.   Musculoskeletal: Negative.   Neurological: Negative.   Endo/Heme/Allergies: Negative.   Psychiatric/Behavioral: Positive for suicidal ideas and hallucinations.    Blood pressure 132/85, pulse 85, temperature 98.8 F (37.1 C), temperature source Oral, resp. rate 16, height 5' 4.5" (1.638 m), weight 67.586 kg (149 lb), last menstrual period 07/22/2013.Body mass index is 25.19 kg/(m^2).  General Appearance: Guarded  Eye Contact::  Fair  Speech:  Slow  Volume:  Decreased  Mood:  Dysphoric  Affect:  Constricted  Thought Process:  Disorganized  Orientation:  Full (Time, Place, and Person)  Thought Content:  Paranoid Ideation and Rumination  Suicidal Thoughts:  Yes.  without intent/plan  Homicidal Thoughts:  No  Memory:  Recent;   Poor  Judgement:  Poor  Insight:  Lacking  Psychomotor Activity:  Decreased  Concentration:  Fair  Recall:  [employer] of Knowledge:Fair  Language: Fair  Akathisia:  Negative  Handed:  Right  AIMS (if indicated):     Assets:  Desire for Improvement Social Support  Sleep:  Number of Hours: 5.25    Musculoskeletal: Strength & Muscle Tone: within normal limits Gait & Station: normal Patient leans: N/A  Past Psychiatric History: Diagnosis:  Hospitalizations:  Outpatient Care:  Substance Abuse Care:  Self-Mutilation:  Suicidal Attempts:  Violent Behaviors:   Past Medical History:   Past Medical History  Diagnosis Date  . Hypertension   . Leg pain   . Substance abuse   . Bipolar 1 disorder   . Depression   . Psychosis   . Carpal tunnel syndrome    None. Allergies:   Allergies  Allergen Reactions  . Latex Rash   PTA Medications: Prescriptions prior to admission  Medication Sig Dispense Refill  .  lubiprostone (AMITIZA) 8 MCG capsule Take 8 mcg by mouth 2 (two) times daily with a meal.      . QUEtiapine (SEROQUEL) 100  MG tablet Take 1 tablet (100 mg total) by mouth 2 (two) times daily.  60 tablet  0  . sertraline (ZOLOFT) 100 MG tablet Take 100 mg by mouth daily.      . traZODone (DESYREL) 100 MG tablet Take 1 tablet (100 mg total) by mouth at bedtime.  30 tablet  0  . traZODone (DESYREL) 50 MG tablet Take 50 mg by mouth at bedtime as needed for sleep.      Marland Kitchen acetaminophen (TYLENOL) 500 MG tablet Take 1,000 mg by mouth every 4 (four) hours as needed (fever greater than 100.4).      Marland Kitchen alum & mag hydroxide-simeth (MAALOX/MYLANTA) 200-200-20 MG/5ML suspension Take 30 mLs by mouth every 4 (four) hours as needed for heartburn (gastric discomfort).      . bismuth subsalicylate (PEPTO BISMOL) 262 MG/15ML suspension Take 5 mLs by mouth 2 (two) times daily as needed (upset stomach).      . cloNIDine (CATAPRES) 0.1 MG tablet Take 0.1 mg by mouth every 8 (eight) hours as needed (anxiety. Hold for SBP less than 100, DBP less than 60.).      Marland Kitchen dextromethorphan-guaiFENesin (ROBITUSSIN-DM) 10-100 MG/5ML liquid Take 10 mLs by mouth every 4 (four) hours as needed for cough.      . dicyclomine (BENTYL) 10 MG capsule Take 10 mg by mouth 4 (four) times daily -  before meals and at bedtime. As needed for abdominal cramping      . docusate sodium (COLACE) 100 MG capsule Take 100 mg by mouth 2 (two) times daily as needed for mild constipation.      . gabapentin (NEURONTIN) 100 MG capsule Take 100 mg by mouth 2 (two) times daily.      . hydrOXYzine (ATARAX/VISTARIL) 25 MG tablet Take 25 mg by mouth 2 (two) times daily.      . hydrOXYzine (ATARAX/VISTARIL) 50 MG tablet Take 1 tablet (50 mg total) by mouth daily.  30 tablet  0  . ibuprofen (ADVIL,MOTRIN) 800 MG tablet Take 800 mg by mouth 3 (three) times daily as needed for moderate pain.      Marland Kitchen lamoTRIgine (LAMICTAL) 200 MG tablet Take 200 mg by mouth daily.      Marland Kitchen lamoTRIgine (LAMICTAL) 25 MG tablet Take 25 mg by mouth daily.      Marland Kitchen lithium carbonate 300 MG capsule Take 3 capsules (900  mg total) by mouth at bedtime.  90 capsule  0  . loratadine (CLARITIN) 10 MG tablet Take 10 mg by mouth daily.      . magnesium hydroxide (MILK OF MAGNESIA) 400 MG/5ML suspension Take 30 mLs by mouth 2 (two) times daily as needed for mild constipation.      . naltrexone (DEPADE) 50 MG tablet Take 50 mg by mouth daily.      . ranitidine (ZANTAC) 150 MG tablet Take 150 mg by mouth as needed for heartburn (no more than 2 tabs per 24 hours).        Previous Psychotropic Medications:  Medication/Dose  Seroquel $RemoveB'100mg'oyQOdBfS$  bid   zoloft $Remov'200mg'mSbvhq$  qd  See medication list above           Substance Abuse History in the last 12 months:  yes  Consequences of Substance Abuse: Medical Consequences:  explained damaging liver Legal Consequences:  impulsivity and inhibition Family Consequences:  loosing social  support  Social History:  reports that she quit smoking about 17 years ago. She has never used smokeless tobacco. She reports that she drinks about 7.2 ounces of alcohol per week. She reports that she does not use illicit drugs. Additional Social History: History of alcohol / drug use?: Yes Negative Consequences of Use: Personal relationships Name of Substance 1: Maunie 1 - Age of First Use: 10 years 1 - Amount (size/oz): A case and 1 bottle of liquor 1 - Frequency: DAily 1 - Duration: years 1 - Last Use / Amount: 08/02/13                  Current Place of Residence:   Place of Birth:   Family Members: Marital Status:  Single Children:  Sons:  Daughters: Relationships: Education:  GED Educational Problems/Performance: Religious Beliefs/Practices: History of Abuse (Emotional/Phsycial/Sexual) Occupational Experiences; Military History:  None. Legal History: Hobbies/Interests:  Family History:   Family History  Problem Relation Age of Onset  . Lung cancer Mother   . Colon cancer Father   . Diabetes Father   . Cancer Maternal Grandmother   . Cancer Maternal Grandfather   .  Cancer Paternal Grandmother   . Prostate cancer Father   . Colon polyps Mother     Results for orders placed during the hospital encounter of 08/09/13 (from the past 72 hour(s))  URINALYSIS, ROUTINE W REFLEX MICROSCOPIC     Status: Abnormal   Collection Time    08/10/13  6:15 AM      Result Value Range   Color, Urine YELLOW  YELLOW   APPearance CLEAR  CLEAR   Specific Gravity, Urine 1.009  1.005 - 1.030   pH 8.5 (*) 5.0 - 8.0   Glucose, UA NEGATIVE  NEGATIVE mg/dL   Hgb urine dipstick NEGATIVE  NEGATIVE   Bilirubin Urine NEGATIVE  NEGATIVE   Ketones, ur NEGATIVE  NEGATIVE mg/dL   Protein, ur NEGATIVE  NEGATIVE mg/dL   Urobilinogen, UA 0.2  0.0 - 1.0 mg/dL   Nitrite NEGATIVE  NEGATIVE   Leukocytes, UA NEGATIVE  NEGATIVE   Comment: MICROSCOPIC NOT DONE ON URINES WITH NEGATIVE PROTEIN, BLOOD, LEUKOCYTES, NITRITE, OR GLUCOSE <1000 mg/dL.     Performed at [place] (HOSP PERFORMED)     Status: None   Collection Time    08/10/13  6:15 AM      Result Value Range   Opiates NONE DETECTED  NONE DETECTED   Cocaine NONE DETECTED  NONE DETECTED   Benzodiazepines NONE DETECTED  NONE DETECTED   Amphetamines NONE DETECTED  NONE DETECTED   Tetrahydrocannabinol NONE DETECTED  NONE DETECTED   Barbiturates NONE DETECTED  NONE DETECTED   Comment:            DRUG SCREEN FOR MEDICAL PURPOSES     ONLY.  IF CONFIRMATION IS NEEDED     FOR ANY PURPOSE, NOTIFY LAB     WITHIN 5 DAYS.                LOWEST DETECTABLE LIMITS     FOR URINE DRUG SCREEN     Drug Class       Cutoff (ng/mL)     Amphetamine      1000     Barbiturate      200     Benzodiazepine   093     Tricyclics       235     Opiates  300     Cocaine          300     THC              50     Performed at [facility], URINE     Status: None   Collection Time    08/10/13  6:15 AM      Result Value Range   Preg Test, Ur NEGATIVE  NEGATIVE   Comment:             THE SENSITIVITY OF THIS     METHODOLOGY IS >20 mIU/mL.     Performed at [facility]  CBC     Status: None   Collection Time    08/10/13  6:40 AM      Result Value Range   WBC 5.5  4.0 - 10.5 K/uL   RBC 4.40  3.87 - 5.11 MIL/uL   Hemoglobin 13.2  12.0 - 15.0 g/dL   HCT 38.4  36.0 - 46.0 %   MCV 87.3  78.0 - 100.0 fL   MCH 30.0  26.0 - 34.0 pg   MCHC 34.4  30.0 - 36.0 g/dL   RDW 14.0  11.5 - 15.5 %   Platelets 303  150 - 400 K/uL   Comment: Performed at [place] PANEL     Status: Abnormal   Collection Time    08/10/13  6:40 AM      Result Value Range   Sodium 135 (*) 137 - 147 mEq/L   Potassium 4.2  3.7 - 5.3 mEq/L   Chloride 100  96 - 112 mEq/L   CO2 25  19 - 32 mEq/L   Glucose, Bld 108 (*) 70 - 99 mg/dL   BUN 5 (*) 6 - 23 mg/dL   Creatinine, Ser 1.05  0.50 - 1.10 mg/dL   Calcium 9.9  8.4 - 10.5 mg/dL   Total Protein 8.1  6.0 - 8.3 g/dL   Albumin 3.7  3.5 - 5.2 g/dL   AST 25  0 - 37 U/L   ALT 13  0 - 35 U/L   Alkaline Phosphatase 41  39 - 117 U/L   Total Bilirubin <0.2 (*) 0.3 - 1.2 mg/dL   GFR calc non Af Amer 64 (*) >90 mL/min   GFR calc Af Amer 74 (*) >90 mL/min   Comment: (NOTE)     The eGFR has been calculated using the CKD EPI equation.     This calculation has not been validated in all clinical situations.     eGFR's persistently <90 mL/min signify possible Chronic Kidney     Disease.     Performed at [facility]  ETHANOL     Status: None   Collection Time    08/10/13  6:40 AM      Result Value Range   Alcohol, Ethyl (B) <11  0 - 11 mg/dL   Comment:            LOWEST DETECTABLE LIMIT FOR     SERUM ALCOHOL IS 11 mg/dL     FOR MEDICAL PURPOSES ONLY     Performed at [place] LEVEL     Status: None   Collection Time    08/10/13  6:40 AM      Result Value Range   Acetaminophen (Tylenol), Serum <15.0  10 - 30 ug/mL   Comment:  THERAPEUTIC CONCENTRATIONS VARY     SIGNIFICANTLY. A RANGE OF 10-30     ug/mL MAY BE AN EFFECTIVE     CONCENTRATION FOR MANY PATIENTS.     HOWEVER, SOME ARE BEST TREATED     AT CONCENTRATIONS OUTSIDE THIS     RANGE.     ACETAMINOPHEN CONCENTRATIONS     >150 ug/mL AT 4 HOURS AFTER     INGESTION AND >50 ug/mL AT 12     HOURS AFTER INGESTION ARE     OFTEN ASSOCIATED WITH TOXIC     REACTIONS.     Performed at [place] LEVEL     Status: Abnormal   Collection Time    08/10/13  6:40 AM      Result Value Range   Salicylate Lvl <6.1 (*) 2.8 - 20.0 mg/dL   Comment: Performed at [place]     Status: None   Collection Time    08/10/13  6:40 AM      Result Value Range   Lithium Lvl 1.28  0.80 - 1.40 mEq/L   Comment: Performed at [facility]   Psychological Evaluations:  Assessment:   DSM5:  Schizophrenia Disorders:  Delusional Disorder (297.1) Obsessive-Compulsive Disorders:   Trauma-Stressor Disorders:  Substance/Addictive Disorders:  Alcohol Related Disorder - Moderate (303.90) and Alcohol Withdrawal with Perceptural Disturbances (F10.232)) Depressive Disorders:  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 Hypertension   . Leg pain   . Substance abuse   . Bipolar 1 disorder   . Depression   . Psychosis   . Carpal tunnel syndrome    AXIS IV:  economic problems, housing problems, other psychosocial or environmental problems and problems related to social environment AXIS V:  31-40 impairment in reality testing  Treatment Plan/Recommendations:  Admit to Unit. Stabilize with medications. Restarted medications and keep under observation.  Treatment Plan Summary: Daily contact with patient to assess and evaluate  symptoms and progress in treatment Medication management Current Medications:  Current Facility-Administered Medications  Medication Dose Route Frequency Provider Last Rate Last Dose  . acetaminophen (TYLENOL) tablet 650 mg  650 mg Oral Q6H PRN Lurena Nida, NP      . alum & mag hydroxide-simeth (MAALOX/MYLANTA) 200-200-20 MG/5ML suspension 30 mL  30 mL Oral Q4H PRN Lurena Nida, NP      . gabapentin (NEURONTIN) capsule 100 mg  100 mg Oral BID Lurena Nida, NP      . lamoTRIgine (LAMICTAL) tablet 225 mg  225 mg Oral Daily Lurena Nida, NP      . lithium carbonate capsule 900 mg  900 mg Oral QHS Lurena Nida, NP      . lubiprostone (AMITIZA) capsule 8 mcg  8 mcg Oral BID WC Lurena Nida, NP      . magnesium hydroxide (MILK OF MAGNESIA) suspension 30 mL  30 mL Oral Daily PRN Lurena Nida, NP      . QUEtiapine (SEROQUEL) tablet 100 mg  100 mg Oral BID Merian Capron, MD      . sertraline (ZOLOFT) tablet 200 mg  200 mg Oral Daily Merian Capron, MD      . traZODone (DESYREL) tablet 50 mg  50 mg Oral QHS PRN Lurena Nida, NP  Observation Level/Precautions:  Elopement 15 minute checks  Laboratory:  lithium level  Psychotherapy:  As per group  Medications:  See chart  Consultations:  As needed  Discharge Concerns:  Consider rehab program  Estimated LOS:5 - 7 days  Other:     I certify that inpatient services furnished can reasonably be expected to improve the patient's condition.   Antwonette Feliz 1/31/201510:23 AM

## 2013-08-10 NOTE — BHH Group Notes (Signed)
[place] [facility] Group Notes:  (Clinical Social Work)  08/10/2013  11:00-11:45AM  Summary of Progress/Problems:   The main focus of today's process group was for the patient to identify ways in which they have in the past sabotaged their own recovery and reasons they may have done this/what they received from doing it.  We then worked to identify a specific plan to avoid doing this when discharged from the hospital for this admission.  The patient expressed that she self-sabotages with drinking and using drugs, but this increases her auditory hallucinations and that really irritates her.  She is very open to change.  Type of Therapy:  Group Therapy - Process  Participation Level:  Active  Participation Quality:  Attentive, Sharing and Supportive  Affect:  Appropriate and Depressed  Cognitive:  Appropriate and Oriented  Insight:  Engaged  Engagement in Therapy:  Engaged  Modes of Intervention:  Clarification, Education, Exploration, Discussion  Selmer Dominion, LCSW 08/10/2013, 1:11 PM
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
## 2013-08-10 NOTE — Tx Team (Signed)
Initial Interdisciplinary Treatment Plan  PATIENT STRENGTHS: (choose at least two) Capable of independent living Motivation for treatment/growth  PATIENT STRESSORS: Loss of mother in July 2nd 2014 Substance abuse   PROBLEM LIST: Problem List/Patient Goals Date to be addressed Date deferred Reason deferred Estimated date of resolution  ALCOHOL Abuse 08/10/13                                                      DISCHARGE CRITERIA:  Adequate post-discharge living arrangements Improved stabilization in mood, thinking, and/or behavior Motivation to continue treatment in a less acute level of care Verbal commitment to aftercare and medication compliance  PRELIMINARY DISCHARGE PLAN: Attend 12-step recovery group Participate in family therapy Return to previous living arrangement  PATIENT/FAMIILY INVOLVEMENT: This treatment plan has been presented to and reviewed with the patient, [name], and/or family member.  The patient and family have been given the opportunity to ask questions and make suggestions.  Lubertha South [facility] 08/10/2013, 12:55 AM

## 2013-08-10 NOTE — Progress Notes (Signed)
[name] is UAL OOB on the 400 hall today..tolerated fair. She makes brief eye contact. She is pleasant. She attended her morning RN group and is compliant with her meds.\    A She completed her morning self inventory and in doing so, rated her depression and hopelessness "8/8" and says her feelings of SI are " off and on". She is able to contract verbally with this Probation officer, to stay safe.   R Safety is in place and poc includes increasing zoloft from 100 to 200 mg today.

## 2013-08-11 DIAGNOSIS — F23 Brief psychotic disorder: Secondary | ICD-10-CM

## 2013-08-11 LAB — LITHIUM LEVEL: Lithium Lvl: 0.86 mEq/L (ref 0.80–1.40)

## 2013-08-11 MED ORDER — TRAZODONE HCL 100 MG PO TABS
100.0000 mg | ORAL_TABLET | Freq: Every evening | ORAL | Status: DC | PRN
  Administered 2013-08-11 – 2013-08-17 (×7): 100 mg via ORAL
  Filled 2013-08-11 (×7): qty 1

## 2013-08-11 NOTE — Progress Notes (Signed)
Nutrition Brief Note  Patient identified on the Malnutrition Screening Tool (MST) Report  Wt Readings from Last 15 Encounters:  08/10/13 149 lb (67.586 kg)  07/22/13 149 lb 3.2 oz (67.677 kg)  06/21/13 149 lb (67.586 kg)  06/17/13 153 lb (69.4 kg)  06/03/13 155 lb (70.308 kg)    Body mass index is 25.19 kg/(m^2). Patient meets criteria for overweight based on current BMI.   Patient admitted for paranoia and depression. She had fair intake prior to admission due to lack of appetite. Her appetite is improved since admission. Patient seen by RD at previous admission in mid December. Her weight is unchanged since then. Patient not interested in nutritional supplements.   Current diet order is regular and patient offered a choice of unit snacks mid-morning and mid-afternoon. Patient eating as desired.   Labs and medications reviewed.   Interventions:  1. Discussed the importance of nutrition and encouraged intake of food and beverages.   No nutrition interventions warranted at this time. If nutrition issues arise, please consult RD.   Larey Seat, RD, LDN Pager #: [phone] After-Hours Pager #: [phone]

## 2013-08-11 NOTE — Progress Notes (Signed)
D- Patient is out in milieu interacting with peers and attending groups.  Rates depression and hopelessness at 10 but affect is incongruent with stated mood.  Denies SI.  She reports improving appetite and normal energy.  A- Compliant with scheduled medications and no prn's requested.  Support offered.  Continue current POC and evaluation of treatment goals.  Continue 15' checks for safety.  R- Safety maintained.

## 2013-08-11 NOTE — BHH Group Notes (Signed)
[facility] Group Notes:  (Nursing/MHT/Case Management/Adjunct)  Date:  08/11/2013  Time:  1000  Type of Therapy:  spirituality  Participation Level:  Minimal  Participation Quality:  Appropriate  Affect:  Appropriate  Cognitive:  Appropriate  Insight:  Lacking  Engagement in Group:  Lacking  Modes of Intervention:  Socialization  Summary of Progress/Problems:Quietly watched programming without response  Jobe Igo 08/11/2013, 11:24 AM

## 2013-08-11 NOTE — BHH Group Notes (Signed)
[facility] Group Notes:  (Clinical Social Work)  08/11/2013   11:15am-12:00pm  Summary of Progress/Problems:  The main focus of today's process group was to listen to a variety of genres of music and to identify that different types of music provoke different responses.  The patient then was able to identify personally what was soothing for them, as well as energizing.  Handouts were used to record feelings evoked, as well as how patient can personally use this knowledge in sleep habits, with depression, and with other symptoms.  The patient expressed understanding of concepts, as well as knowledge of how each type of music affected her and how this can be used at home as a wellness/recovery tool.  She was able to identify several pieces of music that could be triggers for her, and agreed that it would be wise to remove such music from her home.  Type of Therapy:  Music Therapy   Participation Level:  Active  Participation Quality:  Attentive and Sharing  Affect:  Blunted  Cognitive:  Oriented  Insight:  Engaged  Engagement in Therapy:  Engaged  Modes of Intervention:   Activity, Exploration  Selmer Dominion, LCSW 08/11/2013, 12:30pm

## 2013-08-11 NOTE — Progress Notes (Signed)
Pt initially quite pleasant this evening, socializing with peers, cooperative with staff. However at med pass time, pt became upset that her meds were "not like how the doctor had them last time." States she was on topamax and vistaril and both of those have been discontinued. Also upset that her seroquel is at morning and dinner as opposed to morning and hs. Pt agreeable to take what was offered however later was overheard complaining loudly to peers in dayroom. Approached pt and asked what this writer could do to help her. Pt again hostile and walked away. Was able to get patient to state she wanted a repeat trazadone. Provider called and order obtained. Will leave note for MD rounding tomorrow to address pt's med requests. Also suggested to pt she make a list of things to discuss with the doctor tomorrow which she confirms she has done in her journal. She denies SI/HI/AVH and is presently resting in bed. Jamie Kato

## 2013-08-11 NOTE — Progress Notes (Signed)
[facility] MD Progress Note  08/11/2013 10:39 AM [name]  MRN:  [id number] Subjective:  Admitted initally for paranoia , being depressed and unpredictable. Diagnosed with Bipolar disorder and substance induced mood disorder. Increased seroquel and zoloft yesterday. Feels less depressed, still guarded and peculiar. Wanting more trazadone for sleep. Tolerating other meds and no reported side effects. Diagnosis:   DSM5: Schizophrenia Disorders:  Brief Psychotic Disorder (298.8) Obsessive-Compulsive Disorders:   Trauma-Stressor Disorders:   Substance/Addictive Disorders:  Alcohol Related Disorder - Moderate (303.90) Depressive Disorders:  Major Depressive Disorder - with Psychotic Features (296.24) Total Time spent with patient: 20 minutes  Axis I: Bipolar, Depressed, Substance Induced Mood Disorder and Alcohol use disorder, moderate  ADL's:  Intact  Sleep: Poor  Appetite:  Fair  Suicidal Ideation:  Plan:  no  Intent:  no Homicidal Ideation:  Plan:  no  Intent:  no AEB (as evidenced by):  Psychiatric Specialty Exam: Physical Exam  Vitals reviewed. Constitutional: She appears well-developed.    Review of Systems  Psychiatric/Behavioral: Positive for depression and hallucinations.    Blood pressure 132/85, pulse 85, temperature 98.8 F (37.1 C), temperature source Oral, resp. rate 16, height 5' 4.5" (1.638 m), weight 67.586 kg (149 lb), last menstrual period 07/22/2013.Body mass index is 25.19 kg/(m^2).  General Appearance: Bizarre  Eye Contact::  Minimal  Speech:  Slow  Volume:  Decreased  Mood:  Dysphoric  Affect:  Congruent  Thought Process:  Circumstantial  Orientation:  Full (Time, Place, and Person)  Thought Content:  Hallucinations: Auditory  Suicidal Thoughts:  No  Homicidal Thoughts:  No  Memory:  Recent;   Fair  Judgement:  Impaired  Insight:  Lacking  Psychomotor Activity:  Decreased  Concentration:  Fair  Recall:  Poor  Fund of Knowledge:Fair  Language:  Fair  Akathisia:  Negative  Handed:  Right  AIMS (if indicated):     Assets:  Desire for Improvement Social Support  Sleep:  Number of Hours: 6   Musculoskeletal: Strength & Muscle Tone: within normal limits Gait & Station: normal Patient leans: N/A  Current Medications: Current Facility-Administered Medications  Medication Dose Route Frequency Provider Last Rate Last Dose  . acetaminophen (TYLENOL) tablet 650 mg  650 mg Oral Q6H PRN Lurena Nida, NP      . alum & mag hydroxide-simeth (MAALOX/MYLANTA) 200-200-20 MG/5ML suspension 30 mL  30 mL Oral Q4H PRN Lurena Nida, NP      . gabapentin (NEURONTIN) capsule 100 mg  100 mg Oral BID Lurena Nida, NP   100 mg at 08/11/13 0809  . lamoTRIgine (LAMICTAL) tablet 225 mg  225 mg Oral Daily Lurena Nida, NP   225 mg at 08/11/13 0809  . lithium carbonate capsule 900 mg  900 mg Oral QHS Lurena Nida, NP   900 mg at 08/10/13 2202  . lubiprostone (AMITIZA) capsule 8 mcg  8 mcg Oral BID WC Lurena Nida, NP   8 mcg at 08/11/13 0809  . magnesium hydroxide (MILK OF MAGNESIA) suspension 30 mL  30 mL Oral Daily PRN Lurena Nida, NP      . QUEtiapine (SEROQUEL) tablet 100 mg  100 mg Oral BID Merian Capron, MD   100 mg at 08/11/13 0809  . sertraline (ZOLOFT) tablet 200 mg  200 mg Oral Daily Merian Capron, MD   200 mg at 08/11/13 0809  . traZODone (DESYREL) tablet 50 mg  50 mg Oral QHS PRN Lurena Nida, NP   50  mg at 08/10/13 2203    Lab Results:  Results for orders placed during the hospital encounter of 08/09/13 (from the past 48 hour(s))  URINALYSIS, ROUTINE W REFLEX MICROSCOPIC     Status: Abnormal   Collection Time    08/10/13  6:15 AM      Result Value Range   Color, Urine YELLOW  YELLOW   APPearance CLEAR  CLEAR   Specific Gravity, Urine 1.009  1.005 - 1.030   pH 8.5 (*) 5.0 - 8.0   Glucose, UA NEGATIVE  NEGATIVE mg/dL   Hgb urine dipstick NEGATIVE  NEGATIVE   Bilirubin Urine NEGATIVE  NEGATIVE   Ketones, ur NEGATIVE  NEGATIVE  mg/dL   Protein, ur NEGATIVE  NEGATIVE mg/dL   Urobilinogen, UA 0.2  0.0 - 1.0 mg/dL   Nitrite NEGATIVE  NEGATIVE   Leukocytes, UA NEGATIVE  NEGATIVE   Comment: MICROSCOPIC NOT DONE ON URINES WITH NEGATIVE PROTEIN, BLOOD, LEUKOCYTES, NITRITE, OR GLUCOSE <1000 mg/dL.     Performed at [place] (HOSP PERFORMED)     Status: None   Collection Time    08/10/13  6:15 AM      Result Value Range   Opiates NONE DETECTED  NONE DETECTED   Cocaine NONE DETECTED  NONE DETECTED   Benzodiazepines NONE DETECTED  NONE DETECTED   Amphetamines NONE DETECTED  NONE DETECTED   Tetrahydrocannabinol NONE DETECTED  NONE DETECTED   Barbiturates NONE DETECTED  NONE DETECTED   Comment:            DRUG SCREEN FOR MEDICAL PURPOSES     ONLY.  IF CONFIRMATION IS NEEDED     FOR ANY PURPOSE, NOTIFY LAB     WITHIN 5 DAYS.                LOWEST DETECTABLE LIMITS     FOR URINE DRUG SCREEN     Drug Class       Cutoff (ng/mL)     Amphetamine      1000     Barbiturate      200     Benzodiazepine   536     Tricyclics       144     Opiates          300     Cocaine          300     THC              50     Performed at [place], URINE     Status: None   Collection Time    08/10/13  6:15 AM      Result Value Range   Preg Test, Ur NEGATIVE  NEGATIVE   Comment:            THE SENSITIVITY OF THIS     METHODOLOGY IS >20 mIU/mL.     Performed at [facility]  CBC     Status: None   Collection Time    08/10/13  6:40 AM      Result Value Range   WBC 5.5  4.0 - 10.5 K/uL   RBC 4.40  3.87 - 5.11 MIL/uL   Hemoglobin 13.2  12.0 - 15.0 g/dL   HCT 38.4  36.0 - 46.0 %   MCV 87.3  78.0 - 100.0 fL   MCH 30.0  26.0 - 34.0 pg   MCHC  34.4  30.0 - 36.0 g/dL   RDW 14.0  11.5 - 15.5 %   Platelets 303  150 - 400 K/uL   Comment: Performed at [place]     Status: Abnormal    Collection Time    08/10/13  6:40 AM      Result Value Range   Sodium 135 (*) 137 - 147 mEq/L   Potassium 4.2  3.7 - 5.3 mEq/L   Chloride 100  96 - 112 mEq/L   CO2 25  19 - 32 mEq/L   Glucose, Bld 108 (*) 70 - 99 mg/dL   BUN 5 (*) 6 - 23 mg/dL   Creatinine, Ser 1.05  0.50 - 1.10 mg/dL   Calcium 9.9  8.4 - 10.5 mg/dL   Total Protein 8.1  6.0 - 8.3 g/dL   Albumin 3.7  3.5 - 5.2 g/dL   AST 25  0 - 37 U/L   ALT 13  0 - 35 U/L   Alkaline Phosphatase 41  39 - 117 U/L   Total Bilirubin <0.2 (*) 0.3 - 1.2 mg/dL   GFR calc non Af Amer 64 (*) >90 mL/min   GFR calc Af Amer 74 (*) >90 mL/min   Comment: (NOTE)     The eGFR has been calculated using the CKD EPI equation.     This calculation has not been validated in all clinical situations.     eGFR's persistently <90 mL/min signify possible Chronic Kidney     Disease.     Performed at [facility]  ETHANOL     Status: None   Collection Time    08/10/13  6:40 AM      Result Value Range   Alcohol, Ethyl (B) <11  0 - 11 mg/dL   Comment:            LOWEST DETECTABLE LIMIT FOR     SERUM ALCOHOL IS 11 mg/dL     FOR MEDICAL PURPOSES ONLY     Performed at [place] LEVEL     Status: None   Collection Time    08/10/13  6:40 AM      Result Value Range   Acetaminophen (Tylenol), Serum <15.0  10 - 30 ug/mL   Comment:            THERAPEUTIC CONCENTRATIONS VARY     SIGNIFICANTLY. A RANGE OF 10-30     ug/mL MAY BE AN EFFECTIVE     CONCENTRATION FOR MANY PATIENTS.     HOWEVER, SOME ARE BEST TREATED     AT CONCENTRATIONS OUTSIDE THIS     RANGE.     ACETAMINOPHEN CONCENTRATIONS     >150 ug/mL AT 4 HOURS AFTER     INGESTION AND >50 ug/mL AT 12     HOURS AFTER INGESTION ARE     OFTEN ASSOCIATED WITH TOXIC     REACTIONS.     Performed at [place] LEVEL     Status: Abnormal   Collection Time    08/10/13  6:40 AM      Result Value Range   Salicylate Lvl  <4.1 (*) 2.8 - 20.0 mg/dL   Comment: Performed at [place]     Status: None   Collection Time    08/10/13  6:40 AM      Result Value Range   Lithium Lvl 1.28  0.80 - 1.40 mEq/L   Comment: Performed at [facility]    Physical Findings: AIMS: Facial and Oral Movements Muscles of Facial Expression: None, normal Lips and Perioral Area: None, normal Jaw: None, normal Tongue: None, normal,Extremity Movements Upper (arms, wrists, hands, fingers): None, normal Lower (legs, knees, ankles, toes): None, normal, Trunk Movements Neck, shoulders, hips: None, normal, Overall Severity Severity of abnormal movements (highest score from questions above): None, normal Incapacitation due to abnormal movements: None, normal Patient's awareness of abnormal movements (rate only patient's report): No Awareness, Dental Status Current problems with teeth and/or dentures?: No Does patient usually wear dentures?: No  CIWA:  CIWA-Ar Total: 0 COWS:     Treatment Plan Summary: Daily contact with patient to assess and evaluate symptoms and progress in treatment Medication management Increase Trazadone to $RemoveBefo'100mg'[id number]$  qhs.  Plan:  Medical Decision Making Problem Points:  Established problem, stable/improving (1), Review of last therapy session (1) and Review of psycho-social stressors (1) Data Points:  Review of medication regiment & side effects (2) Review of new medications or change in dosage (2)  I certify that inpatient services furnished can reasonably be expected to improve the patient's condition.   Aleeta Schmaltz 08/11/2013, 10:39 AM

## 2013-08-11 NOTE — Progress Notes (Signed)
Pt remains labile, particularly regarding meds. Irritated that her seroquel was not moved to BID at AM and HS however pt took the second dose at 1700 per order rather than refusing for later. Per report, did not ask MD that it be changed. Also angry that vistaril is not ordered prn. Reminded pt we spoke about this last night and she was to address her concerns with MD today. Pt was appreciative of trazadone change to 100mg . Encouraged her to speak with attending tomorrow. She also reported a headache of a 9/10 (after receiving tylenol) earlier in evening however was observed dancing and singing in dayroom with peers. She denies SI/HI/AVH and remains safe. Jamie Kato

## 2013-08-12 MED ORDER — HALOPERIDOL 2 MG PO TABS
2.0000 mg | ORAL_TABLET | Freq: Two times a day (BID) | ORAL | Status: DC
  Administered 2013-08-12 – 2013-08-13 (×2): 2 mg via ORAL
  Filled 2013-08-12 (×4): qty 1

## 2013-08-12 MED ORDER — OLANZAPINE 5 MG PO TBDP
5.0000 mg | ORAL_TABLET | Freq: Three times a day (TID) | ORAL | Status: DC | PRN
  Administered 2013-08-12 – 2013-08-18 (×9): 5 mg via ORAL
  Filled 2013-08-12 (×9): qty 1

## 2013-08-12 MED ORDER — IBUPROFEN 200 MG PO TABS
600.0000 mg | ORAL_TABLET | Freq: Four times a day (QID) | ORAL | Status: DC | PRN
  Administered 2013-08-12 – 2013-08-19 (×4): 600 mg via ORAL
  Filled 2013-08-12 (×4): qty 3

## 2013-08-12 NOTE — Progress Notes (Signed)
Patient ID: [name], female   DOB: October 05, 1970, 43 y.o.   MRN: [id number] D: Patient is tearful and holding her head while she is speaking to staff.  She complained of a migraine with morning and was admin. Tylenol for same.  She reports auditory hallucinations and passive SI. She contracts for safety on the unit. She denies any HI.  Patient stated she had been off her medications for a few months.  She also reports excessive drinking the morning of her admission.  A. Continue to monitor medication management and MD orders.  Safety checks completed every 15 minutes per protocol.  R: patient is receptive to staff.

## 2013-08-12 NOTE — Progress Notes (Signed)
The focus of this group is to help patients review their daily goal of treatment and discuss progress on daily workbooks. Pt attended the evening group session and responded to all discussion prompts from the [place]. Pt shared that today was a bad day due to Pt not feeling that her medications were helping her. Pt complained that she did not understand recent changes in her scheduled meds. Pt was encouraged by the Writer to speak with her RN about her medications following group as well as her physician in the morning. 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 Pt said that she had done this before and that she knew of a place that would help her start a business.

## 2013-08-12 NOTE — BHH Group Notes (Signed)
[facility] LCSW Aftercare Discharge Planning Group Note   08/12/2013 8:25 AM  Participation Quality:  Engaged  Mood/Affect:  Depressed  Depression Rating:  8  Anxiety Rating:  8  Thoughts of Suicide:  No Will you contract for safety?   NA  Current AVH:  No  Plan for Discharge/Comments:  [name] states that she is here due to alcohol and pill use.  Her psychiatrist committed her, she spent "about 8 days" at [facility] waiting for a bed, and is happy she got here "because I have decided I need help."  Hopes to get into rehab from here.  States she has a date at [facility] on the 9th, but wants to go somewhere directly from here.  Will get her to sign release for [facility], [facility].  Transportation Means:  unk  Supports: unk  [employer], [name]

## 2013-08-12 NOTE — Tx Team (Signed)
  Interdisciplinary Treatment Plan Update   Date Reviewed:  08/12/2013  Time Reviewed:  8:23 AM  Progress in Treatment:   Attending groups: Yes Participating in groups: Yes Taking medication as prescribed: Yes  Tolerating medication: Yes Family/Significant other contact made: No  Patient understands diagnosis: Yes AEB asking for help with psychosis, getting into rehab Discussing patient identified problems/goals with staff: Yes  See initial care plan Medical problems stabilized or resolved: Yes Denies suicidal/homicidal ideation: Yes  In tx team Patient has not harmed self or others: Yes  For review of initial/current patient goals, please see plan of care.  Estimated Length of Stay:  3-5 days  Reason for Continuation of Hospitalization: Depression Hallucinations Medication stabilization  New Problems/Goals identified:  N/A  Discharge Plan or Barriers:   Hopes to get into rehab from here  Additional Comments:  The patient states that she did not intend to drink when she left last time, but it did not take long. She went to a singing show at a bar the day after her discharge, and guys bought her drinks which she did not refuse. It kept escalating, and she kept buying more and more alcohol, felt out of control, went from beer to vodka to marijuana. She became more depressed, didn't think about taking her medication any longer. The voices came on strong and she became angry, sad, not caring.  She went to her psychiatric appointment drunk, with her hat over her face thinking that nobody could tell she was drunk. She was then IVC'd by the doctor, and even though she was angry at first, now she is glad. She believes she stayed at [facility] in their holding rooms for approximately 8 days.    Attendees:  Signature: Corena Pilgrim, MD 08/12/2013 8:23 AM   Signature: Ripley Fraise, LCSW 08/12/2013 8:23 AM  Signature: Elmarie Shiley, NP 08/12/2013 8:23 AM  Signature: Mayra Neer, RN 08/12/2013 8:23 AM   Signature: Darrol Angel, RN 08/12/2013 8:23 AM  Signature:  08/12/2013 8:23 AM  Signature:   08/12/2013 8:23 AM  Signature:    Signature:    Signature:    Signature:    Signature:    Signature:      Scribe for Treatment Team:   Ripley Fraise, LCSW  08/12/2013 8:23 AM

## 2013-08-12 NOTE — BHH Group Notes (Signed)
[facility] LCSW Group Therapy  08/12/2013 1:15 pm  Type of Therapy: Process Group Therapy  Participation Level:  Active  Participation Quality:  Appropriate  Affect:  Flat  Cognitive:  Oriented  Insight:  Improving  Engagement in Group:  Limited  Engagement in Therapy:  Limited  Modes of Intervention:  Activity, Clarification, Education, Problem-solving and Support  Summary of Progress/Problems: Today's group addressed the issue of overcoming obstacles.  Patients were asked to identify their biggest obstacle post d/c that stands in the way of their on-going success, and then problem solve as to how to manage this.  [name] echoes another patient's description of isolating and not accomplishing anything.  "I tell myself that I'm too tired, or I have a headache, and I just stay in bed.  Then I have racing thoughts, which is a trigger for me to drink, and then I start hearing voices."  Also complained about not having any friends.  The conversation moved on to another patient, and she abruptly got up and left, and did not return.  [name] 08/12/2013   4:03 PM

## 2013-08-12 NOTE — Progress Notes (Signed)
Patient ID: [name], female   DOB: 23-Jul-1970, 43 y.o.   MRN: [id number] [facility] MD Progress Note  08/12/2013 11:23 AM [name]  MRN:  [id number] Subjective:  Patient states "I stopped my medications after I left last time because I went to a bar to drink. I am still hearing voices and feeling very depressed. My depression is nine and anxiety is at six. I was trying to kill myself with drinking alcohol and taking pills. I just didn't care. I still feel that way."  Objective:  Patient reports continued symptoms of depression and psychosis. She appear very depressed becoming tearful very easily when being asked daily assessment questions. Patient feels hopeless about her future and admits to abusing substances in order to harm herself. Progress notes reviewed from her last admission to [facility] in 12/14 which indicate presence of the same symptoms. Patient is compliant with her medications. Notes from nursing staff indicate patient has been angry about her medication regimen and can be very labile during interactions with staff.   Diagnosis:   DSM5: Schizophrenia Disorders:  Brief Psychotic Disorder (298.8) Obsessive-Compulsive Disorders:   Trauma-Stressor Disorders:   Substance/Addictive Disorders:  Alcohol Related Disorder - Moderate (303.90) Depressive Disorders:   Total Time spent with patient: 20 minutes  Axis I: Bipolar, Depressed Axis II: Cluster B Traits Axis III:  Past Medical History  Diagnosis Date  . Hypertension   . Leg pain   . Substance abuse   . Bipolar 1 disorder   . Depression   . Psychosis   . Carpal tunnel syndrome    Axis IV: economic problems, occupational problems, other psychosocial or environmental problems and problems with primary support group Axis V: 41-50 serious symptoms  ADL's:  Intact  Sleep: Poor  Appetite:  Fair  Suicidal Ideation:  Passive SI with plan to kill self via alcohol abuse  Homicidal Ideation:  Plan:  no  Intent:  no AEB  (as evidenced by):  Psychiatric Specialty Exam: Physical Exam  Vitals reviewed.   Review of Systems  Constitutional: Negative.   Eyes: Negative.   Respiratory: Negative.   Cardiovascular: Negative.   Gastrointestinal: Negative.   Genitourinary: Negative.   Musculoskeletal: Negative.   Skin: Negative.   Neurological: Negative.   Endo/Heme/Allergies: Negative.   Psychiatric/Behavioral: Positive for depression, suicidal ideas, hallucinations and substance abuse. Negative for memory loss. The patient is nervous/anxious and has insomnia.     Blood pressure 106/75, pulse 97, temperature 97.7 F (36.5 C), temperature source Oral, resp. rate 18, height 5' 4.5" (1.638 m), weight 67.586 kg (149 lb), last menstrual period 07/22/2013.Body mass index is 25.19 kg/(m^2).  General Appearance: Casual  Eye Contact::  Minimal  Speech:  Slow  Volume:  Decreased  Mood:  Dysphoric  Affect:  Tearful  Thought Process:  Circumstantial  Orientation:  Full (Time, Place, and Person)  Thought Content:  Hallucinations: Auditory and Rumination  Suicidal Thoughts:  No  Homicidal Thoughts:  No  Memory:  Recent;   Fair  Judgement:  Impaired  Insight:  Lacking  Psychomotor Activity:  Decreased  Concentration:  Fair  Recall:  Poor  Fund of Knowledge:Fair  Language: Fair  Akathisia:  Negative  Handed:  Right  AIMS (if indicated):     Assets:  Desire for Improvement Social Support  Sleep:  Number of Hours: 6   Musculoskeletal: Strength & Muscle Tone: within normal limits Gait & Station: normal Patient leans: N/A  Current Medications: Current Facility-Administered Medications  Medication Dose Route Frequency Provider  Last Rate Last Dose  . acetaminophen (TYLENOL) tablet 650 mg  650 mg Oral Q6H PRN Lurena Nida, NP   650 mg at 08/12/13 2706  . alum & mag hydroxide-simeth (MAALOX/MYLANTA) 200-200-20 MG/5ML suspension 30 mL  30 mL Oral Q4H PRN Lurena Nida, NP      . gabapentin (NEURONTIN) capsule  100 mg  100 mg Oral BID Lurena Nida, NP   100 mg at 08/12/13 0805  . lamoTRIgine (LAMICTAL) tablet 225 mg  225 mg Oral Daily Lurena Nida, NP   225 mg at 08/12/13 0805  . lithium carbonate capsule 900 mg  900 mg Oral QHS Lurena Nida, NP   900 mg at 08/11/13 2156  . lubiprostone (AMITIZA) capsule 8 mcg  8 mcg Oral BID WC Lurena Nida, NP   8 mcg at 08/12/13 0806  . magnesium hydroxide (MILK OF MAGNESIA) suspension 30 mL  30 mL Oral Daily PRN Lurena Nida, NP      . QUEtiapine (SEROQUEL) tablet 100 mg  100 mg Oral BID Merian Capron, MD   100 mg at 08/12/13 0806  . sertraline (ZOLOFT) tablet 200 mg  200 mg Oral Daily Merian Capron, MD   200 mg at 08/12/13 0806  . traZODone (DESYREL) tablet 100 mg  100 mg Oral QHS PRN Merian Capron, MD   100 mg at 08/11/13 2157    Lab Results:  Results for orders placed during the hospital encounter of 08/09/13 (from the past 48 hour(s))  LITHIUM LEVEL     Status: None   Collection Time    08/11/13  7:40 PM      Result Value Range   Lithium Lvl 0.86  0.80 - 1.40 mEq/L   Comment: Performed at [facility]    Physical Findings: AIMS: Facial and Oral Movements Muscles of Facial Expression: None, normal Lips and Perioral Area: None, normal Jaw: None, normal Tongue: None, normal,Extremity Movements Upper (arms, wrists, hands, fingers): None, normal Lower (legs, knees, ankles, toes): None, normal, Trunk Movements Neck, shoulders, hips: None, normal, Overall Severity Severity of abnormal movements (highest score from questions above): None, normal Incapacitation due to abnormal movements: None, normal Patient's awareness of abnormal movements (rate only patient's report): No Awareness, Dental Status Current problems with teeth and/or dentures?: No Does patient usually wear dentures?: No  CIWA:  CIWA-Ar Total: 0 COWS:     Treatment Plan Summary: Daily contact with patient to assess and evaluate symptoms and progress in  treatment Medication management  Plan: 1.Continue crisis management and stabilization.  2.Medication management: Reviewed with patient who stated no untoward effects. Continue Neurontin 100 mg BID for anxiety, Lamictal 225 mg daily for improved mood stability, Lithium 900 mg hs to reduce recurrent suicidal thoughts, Zoloft 200 mg Daily for depressive symptoms, Trazodone 100 mg hs prn for insomnia. D/C Seroquel due to ineffectiveness. Start Haldol 2 mg BID for psychosis. Start Zyprexa Zydis 5 mg every eight hours as needed for psychosis.  3.Encouraged patient to attend groups and participate in group counseling sessions and activities.  4.Discharge plan in progress.  5. Address health issues: Order Motrin 600 mg every six hours as needed for headache.   Medical Decision Making Problem Points:  Established problem, stable/improving (1), Review of last therapy session (1) and Review of psycho-social stressors (1) Data Points:  Review and summation of old records (2) Review of medication regiment & side effects (2) Review of new medications or change in dosage (2)  I certify that inpatient services furnished can reasonably be expected to improve the patient's condition.   Elmarie Shiley NP-C 08/12/2013, 11:23 AM  Patient seen, evaluated and I agree with notes by Nurse Practitioner. Corena Pilgrim, MD

## 2013-08-13 MED ORDER — HALOPERIDOL 5 MG PO TABS
5.0000 mg | ORAL_TABLET | Freq: Once | ORAL | Status: AC
  Administered 2013-08-13: 5 mg via ORAL
  Filled 2013-08-13 (×2): qty 1

## 2013-08-13 MED ORDER — HALOPERIDOL 5 MG PO TABS
5.0000 mg | ORAL_TABLET | Freq: Every day | ORAL | Status: DC
  Administered 2013-08-14: 5 mg via ORAL
  Filled 2013-08-13 (×2): qty 1

## 2013-08-13 NOTE — Progress Notes (Signed)
Patient ID: [name], female   DOB: February 26, 1971, 43 y.o.   MRN: [id number]  D: After the introduction, pt asked if she had something for anxiety or panic attacks. Stated that seroquel works better, and "haldol didn't work the last time". Pt stated "nobody explained the change". Writer informed pt that her concerns would be forwarded to the next shift.   A:  Support and encouragement was offered. 15 min checks continued for safety.  R: Pt remains safe.

## 2013-08-13 NOTE — Progress Notes (Addendum)
D: Pt observed interacting with other pts on the unit. Pt easily agitated during HS med pass. Per pt,she's supposed to start taking Haldol 5 mg tonight. Writer rechecked order for Haldol. Haldol 5 mg is to be started on 08/14/13 at bedtime. According to the [facility], pt was given 2 mg of Haldol this morning. Pt denies taking 2 mg of Haldol this morning.  Pt yelled at [place] and then walked off upset. Pt requesting to take Haldol 5 mg tonight. Writer will consult with [facility] and then f/u with pt. Per [place], [place]. Writer may give pt a one time dose of Haldol tonight.  A: Medications administered as ordered per MD. PRN med given for sleep, per pt request. Verbal support given. 15 minute checks performed for safety. R: Pt safety maintained.

## 2013-08-13 NOTE — [facility] Group Notes (Signed)
[place] LCSW Group Therapy  08/13/2013 , 2:53 PM   Type of Therapy:  Group Therapy  Participation Level:  Active  Participation Quality:  Attentive  Affect:  Appropriate  Cognitive:  Alert  Insight:  Improving  Engagement in Therapy:  Engaged  Modes of Intervention:  Discussion, Exploration and Socialization  Summary of Progress/Problems: Today's group focused on the term Diagnosis.  Participants were asked to define the term, and then pronounce whether it is a negative, positive or neutral term.   Was invested in group.  Talked about diagnosis in terms of the thing that allows you to get the help you need, and indicated she was primarily talking about medication.  She is hopeful that she will be on the right cocktail when she leaves [place].  Also activated when the subject of labels came up.  Talked about others calling her crazy, not based on her talking about a diagnosis, but her behavior.  Again, she is hopeful the meds will help.  [name] 08/13/2013 , 2:53 PM

## 2013-08-13 NOTE — Progress Notes (Signed)
D: Patient denies SI/HI and A/V hallucinations; patient reports that she is currently not hearing voices but she was and they were giving commands; patient reports that she will tell staff if she starts to experience auditory hallucinations    A: Monitored q 15 minutes; patient encouraged to attend groups; patient educated about medications; patient given medications per physician orders; patient encouraged to express feelings and/or concerns  R: Patient is flat, sad, but cooperative; patient is blunted;  patient's interaction with staff and peers is appropriate; patient is guarded and minimal; patient was able to set goal to talk with staff 1:1 when having feelings of SI; patient is taking medications as prescribed and tolerating medications; patient is attending all groups and engages when prompted

## 2013-08-13 NOTE — Progress Notes (Signed)
The focus of this group is to educate the patient on the purpose and policies of crisis stabilization and provide a format to answer questions about their admission.  The group details unit policies and expectations of patients while admitted. Patient is guarded and minimal.

## 2013-08-13 NOTE — Progress Notes (Signed)
Patient ID: [name], female   DOB: 1970-10-22, 43 y.o.   MRN: [id number] [facility] MD Progress Note  08/13/2013 10:30 AM [name]  MRN:  [id number] Subjective:  Patient states "I am still hearing voices, feeling depressed and I wants my Haldol increased.''stopped my medications after I left last time because I went to a bar to drink. I am still hearing voices and feeling very depressed. My depression is nine and anxiety is at six. I was trying to kill myself with drinking alcohol and taking pills. I just didn't care. I still feel that way."  Objective: Patient reports ongoing symptoms of depression, paranoia and psychosis. She reports recurrent auditory hallucinations, mood swings, internal anger, low energy level, lack of motivation and crying spells. She feels hopeless about her future and admits to abusing substances in order to harm herself. She also reports being overwhelmed taking care of 8 children alone without her family assistance. Patient is compliant with her medications and has been scheduled for placement in [facility] for drug and alcohol rehabilitation. 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 Hypertension   . Leg pain   . Carpal tunnel syndrome    Axis IV: economic problems, occupational problems, other psychosocial or environmental problems and problems with primary support group Axis V: 41-50 serious symptoms  ADL's:  Intact  Sleep: Fair  Appetite:  Fair  Suicidal Ideation: yes Denies plan or intent.  Homicidal Ideation:  Plan:  no  Intent:  no AEB (as evidenced by):  Psychiatric  Specialty Exam: Physical Exam  Vitals reviewed.   Review of Systems  Constitutional: Negative.   Eyes: Negative.   Respiratory: Negative.   Cardiovascular: Negative.   Gastrointestinal: Negative.   Genitourinary: Negative.   Musculoskeletal: Negative.   Skin: Negative.   Neurological: Negative.   Endo/Heme/Allergies: Negative.   Psychiatric/Behavioral: Positive for depression, suicidal ideas, hallucinations and substance abuse. Negative for memory loss. The patient is nervous/anxious.     Blood pressure 111/72, pulse 88, temperature 98.3 F (36.8 C), temperature source Oral, resp. rate 16, height 5' 4.5" (1.638 m), weight 67.586 kg (149 lb), last menstrual period 07/22/2013.Body mass index is 25.19 kg/(m^2).  General Appearance: Casual  Eye Contact::  Minimal  Speech:  Slow  Volume:  Decreased  Mood:  Dysphoric  Affect:  Tearful  Thought Process:  Circumstantial  Orientation:  Full (Time, Place, and Person)  Thought Content:  Hallucinations: Auditory and Rumination  Suicidal Thoughts:  No  Homicidal Thoughts:  No  Memory:  Recent;   Fair  Judgement:  Impaired  Insight:  Lacking  Psychomotor Activity:  Decreased  Concentration:  Fair  Recall:  Poor  Fund of Knowledge:Fair  Language: Fair  Akathisia:  Negative  Handed:  Right  AIMS (if indicated):     Assets:  Desire for Improvement Social Support  Sleep:  Number of Hours: 6.75   Musculoskeletal: Strength & Muscle Tone: within normal limits Gait & Station: normal Patient leans: N/A  Current Medications: Current Facility-Administered Medications  Medication Dose Route Frequency Provider Last Rate Last Dose  . acetaminophen (TYLENOL) tablet 650 mg  650 mg Oral Q6H PRN Lurena Nida, NP  650 mg at 08/12/13 [id number]  . alum & mag hydroxide-simeth (MAALOX/MYLANTA) 200-200-20 MG/5ML suspension 30 mL  30 mL Oral Q4H PRN Lurena Nida, NP      . gabapentin (NEURONTIN) capsule 100 mg  100 mg Oral BID Lurena Nida, NP   100  mg at 08/13/13 0820  . [START ON 08/14/2013] haloperidol (HALDOL) tablet 5 mg  5 mg Oral QHS Wasyl Dornfeld      . ibuprofen (ADVIL,MOTRIN) tablet 600 mg  600 mg Oral Q6H PRN Elmarie Shiley, NP   600 mg at 08/12/13 1713  . lamoTRIgine (LAMICTAL) tablet 225 mg  225 mg Oral Daily Lurena Nida, NP   225 mg at 08/13/13 0820  . lithium carbonate capsule 900 mg  900 mg Oral QHS Lurena Nida, NP   900 mg at 08/12/13 2220  . lubiprostone (AMITIZA) capsule 8 mcg  8 mcg Oral BID WC Lurena Nida, NP   8 mcg at 08/13/13 0820  . magnesium hydroxide (MILK OF MAGNESIA) suspension 30 mL  30 mL Oral Daily PRN Lurena Nida, NP      . OLANZapine zydis (ZYPREXA) disintegrating tablet 5 mg  5 mg Oral Q8H PRN Elmarie Shiley, NP   5 mg at 08/12/13 1714  . sertraline (ZOLOFT) tablet 200 mg  200 mg Oral Daily Merian Capron, MD   200 mg at 08/13/13 0820  . traZODone (DESYREL) tablet 100 mg  100 mg Oral QHS PRN Merian Capron, MD   100 mg at 08/12/13 2220    Lab Results:  Results for orders placed during the hospital encounter of 08/09/13 (from the past 48 hour(s))  LITHIUM LEVEL     Status: None   Collection Time    08/11/13  7:40 PM      Result Value Range   Lithium Lvl 0.86  0.80 - 1.40 mEq/L   Comment: Performed at [facility]    Physical Findings: AIMS: Facial and Oral Movements Muscles of Facial Expression: None, normal Lips and Perioral Area: None, normal Jaw: None, normal Tongue: None, normal,Extremity Movements Upper (arms, wrists, hands, fingers): None, normal Lower (legs, knees, ankles, toes): None, normal, Trunk Movements Neck, shoulders, hips: None, normal, Overall Severity Severity of abnormal movements (highest score from questions above): None, normal Incapacitation due to abnormal movements: None, normal Patient's awareness of abnormal movements (rate only patient's report): No Awareness, Dental Status Current problems with teeth and/or dentures?: No Does patient usually wear  dentures?: No  CIWA:  CIWA-Ar Total: 0 COWS:     Treatment Plan Summary: Daily contact with patient to assess and evaluate symptoms and progress in treatment Medication management  Plan: 1.Continue crisis management and stabilization.  2.Medication management: Reviewed with patient who stated no untoward effects. Continue Neurontin 100 mg BID for anxiety, Lamictal 225 mg daily for improved mood stability, Lithium 900 mg hs to reduce recurrent suicidal thoughts, Zoloft 200 mg Daily for depressive symptoms, Trazodone 100 mg hs prn for insomnia. Increase  Haldol to 5mg  po Qhs for psychosis. 3.Encouraged patient to attend groups and participate in group counseling sessions and activities.  4.Discharge plan in progress.  5. Address health issues: Order Motrin 600 mg every six hours as needed for headache.   Medical Decision Making Problem Points:  Established problem, improving (1), Review of last therapy session (1) and Review of psycho-social stressors (1) Data Points:  Review and summation of old records (2) Review of medication regiment & side effects (2) Review of  new medications or change in dosage (2)  I certify that inpatient services furnished can reasonably be expected to improve the patient's condition.   Corena Pilgrim, MD 08/13/2013, 10:30 AM

## 2013-08-13 NOTE — Progress Notes (Signed)
Adult Psychoeducational Group Note  Date:  08/13/2013 Time:  8:00 pm  Group Topic/Focus:  Wrap-Up Group:   The focus of this group is to help patients review their daily goal of treatment and discuss progress on daily workbooks.  Participation Level:  Active  Participation Quality:  Appropriate and Sharing  Affect:  Appropriate  Cognitive:  Appropriate  Insight: Appropriate  Engagement in Group:  Engaged  Modes of Intervention:  Discussion, Education, Socialization and Support  Additional Comments:  Pt stated that she is in the hospital because she has been having suicidal thoughts. Pt stated that she needs to get clean and get back on her medication. Pt stated that she is wise and loves being around people.   Wynetta Emery, Liliyana Thobe 08/13/2013, 8:37 PM

## 2013-08-14 LAB — BASIC METABOLIC PANEL
BUN: <3 mg/dL — ABNORMAL LOW (ref 6–23)
CO2: 30 mEq/L (ref 19–32)
Calcium: 9.1 mg/dL (ref 8.4–10.5)
Chloride: 102 mEq/L (ref 96–112)
Creatinine, Ser: 0.76 mg/dL (ref 0.50–1.10)
GFR calc Af Amer: >90 mL/min (ref >90)
Glucose, Bld: 97 mg/dL (ref 70–99)
POTASSIUM: 4 meq/L (ref 3.7–5.3)
SODIUM: 138 meq/L (ref 137–147)

## 2013-08-14 NOTE — [facility] Group Notes (Signed)
Adult Psychoeducational Group Note  Date:  08/14/2013 Time:  9:29 PM  Group Topic/Focus:  Wrap-Up Group:   The focus of this group is to help patients review their daily goal of treatment and discuss progress on daily workbooks.  Participation Level:  Minimal  Participation Quality:  Appropriate  Affect:  Flat  Cognitive:  Appropriate  Insight: Lacking  Engagement in Group:  Limited  Modes of Intervention:  Discussion  Additional Comments:  [name] rated her day a 7 but didn't want to elaborate.  She stated she is going to [facility] to set up goals.  [name] A 08/14/2013, 9:29 PM

## 2013-08-14 NOTE — B[facility] Group Notes (Signed)
[facility] LCSW Aftercare Discharge Planning Group Note   08/14/2013 8:45 AM  Participation Quality:  Alert, Appropriate and Oriented  Mood/Affect:  Irritable  Depression Rating:  8  Anxiety Rating:  8  Thoughts of Suicide:  Pt denies HI, endorses SI  Will you contract for safety?   Yes  Current AVH:  Pt reports but wouldn't elaborate   Plan for Discharge/Comments:  Pt attended discharge planning group and actively participated in group.  CSW provided pt with today's workbook.  Pt was observed to rip up her self inventory.  Pt was focused on getting a letter to send to [employer] housing.  Unknown d/c plans.  CSW will assess for appropriate referrals.  No further needs voiced by pt at this time.    Transportation Means: Pt reports access to transportation  Supports: No supports mentioned at this time  Regan Lemming, LCSW 08/14/2013 9:53 AM

## 2013-08-14 NOTE — [facility] Group Notes (Signed)
[facility] Mental [employer] Group Therapy  08/14/2013  12:29 PM  Type of Therapy:  Mental Health Association Presentation   Participation Level:  Active  Participation Quality:  Attentive  Affect:  Appropriate  Cognitive:  Appropriate  Insight:  Engaged  Engagement in Therapy:  Engaged  Modes of Intervention:  Discussion, Education and Socialization   Summary of Progress/Problems:  [name] from [place] came to present his recovery story and play the guitar.   Al[place] nodded with agreement when the speaker talked about his experiences with hearing voices and losing weight.  She moved her upper body and smiled  in her seat when the speaker played his guitar.   She told the speaker she would like to attend the group meetings at [facility].     Cassandria Santee   08/14/2013  12:29 PM

## 2013-08-14 NOTE — Progress Notes (Addendum)
D: Per patient self inventory pt reports sleeping well, appetite improving, poor attention span, and low energy level.  Pt reports depression and hopelessness of 8 on an 1-10 scale.  Reports a pain level of 4 -5 for menstrual cramps. Reports SI and audio hallucinations, voices telling her "You are useless, you don't need to live". Denies HI/VH. Affect flat' pt anxiety 8-9 on 1-10 scale.   A: Emotional support supported. Pt encouraged to follow treatment/care plan. Q15 min checks continue for safety.  R: Pt cooperative early in day ; requested PRN Zyprexa for voices. Attended group, responded to music  Focused on leaving [facility] to go to [facility] for treatment stating she has an admit date of 08/19/13.

## 2013-08-14 NOTE — Progress Notes (Signed)
Patient ID: [name], female   DOB: September 14, 1970, 43 y.o.   MRN: [id number] [facility] MD Progress Note  08/14/2013 12:01 PM [name]  MRN:  [id number] Subjective:  Patient states "I am hearing voices telling me very mean things. They say you might as well kill yourself when you get out. I'm so afraid I am going to relapse. I feel very irritable and angry today. The voices make me feel that way. I am just so anxious and depressed."  Objective: Patient reports continued symptoms of depression and psychosis. She reports recurrent auditory hallucinations, mood swings, internal anger, low energy level, excessive worries lack of motivation and crying spells. She feels hopeless about her future and is afraid that she will return to abusing substances to hurt herself. Patient becomes tearful when talking about her stressors. She also feels unable to care for her eight children due to her psychiatric symptoms. She rates her depression at eight and her anxiety at six. Patient is compliant with her medications and has been scheduled for placement in [facility] for drug and alcohol rehabilitation.   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 Hypertension   . Leg pain   . Carpal tunnel syndrome    Axis IV: economic problems, occupational problems, other psychosocial or environmental problems and problems with primary support group Axis V: 41-50 serious symptoms  ADL's:  Intact  Sleep: Fair  Appetite:  Fair  Suicidal Ideation:  Passive SI to overdose on pills.   Homicidal Ideation: No Plan:  no  Intent:  no AEB (as  evidenced by):  Psychiatric Specialty Exam: Physical Exam  Vitals reviewed. Psychiatric: Her mood appears anxious. Her affect is angry and labile. She is agitated. She exhibits a depressed mood. She expresses suicidal ideation.    Review of Systems  Constitutional: Negative.   Eyes: Negative.   Respiratory: Negative.   Cardiovascular: Negative.   Gastrointestinal: Negative.   Genitourinary: Negative.   Musculoskeletal: Negative.   Skin: Negative.   Neurological: Negative.   Endo/Heme/Allergies: Negative.   Psychiatric/Behavioral: Positive for depression, suicidal ideas, hallucinations and substance abuse. Negative for memory loss. The patient is nervous/anxious.     Blood pressure 112/80, pulse 84, temperature 99 F (37.2 C), temperature source Oral, resp. rate 16, height 5' 4.5" (1.638 m), weight 67.586 kg (149 lb), last menstrual period 07/22/2013.Body mass index is 25.19 kg/(m^2).  General Appearance: Casual  Eye Contact::  Minimal  Speech:  Slow  Volume:  Decreased  Mood:  Dysphoric  Affect:  Tearful  Thought Process:  Circumstantial  Orientation:  Full (Time, Place, and Person)  Thought Content:  Hallucinations: Auditory and Rumination  Suicidal Thoughts:  No  Homicidal Thoughts:  No  Memory:  Recent;   Fair  Judgement:  Impaired  Insight:  Lacking  Psychomotor Activity:  Decreased  Concentration:  Fair  Recall:  Poor  Fund of Knowledge:Fair  Language: Fair  Akathisia:  Negative  Handed:  Right  AIMS (if indicated):     Assets:  Desire for Improvement Social Support  Sleep:  Number of Hours: 4.5   Musculoskeletal: Strength & Muscle Tone: within normal limits Gait & Station: normal Patient leans: N/A  Current Medications: Current Facility-Administered Medications  Medication Dose Route Frequency Provider Last Rate Last Dose  . acetaminophen (TYLENOL) tablet 650 mg  650 mg Oral Q6H PRN Lurena Nida, NP   650 mg at 08/12/13 3762  . alum & mag  hydroxide-simeth (MAALOX/MYLANTA) 200-200-20 MG/5ML suspension 30 mL  30 mL Oral Q4H PRN Lurena Nida, NP      . gabapentin (NEURONTIN) capsule 100 mg  100 mg Oral BID Lurena Nida, NP   100 mg at 08/14/13 0735  . haloperidol (HALDOL) tablet 5 mg  5 mg Oral QHS Kemo Spruce      . ibuprofen (ADVIL,MOTRIN) tablet 600 mg  600 mg Oral Q6H PRN Elmarie Shiley, NP   600 mg at 08/12/13 1713  . lamoTRIgine (LAMICTAL) tablet 225 mg  225 mg Oral Daily Lurena Nida, NP   225 mg at 08/14/13 0735  . lithium carbonate capsule 900 mg  900 mg Oral QHS Lurena Nida, NP   900 mg at 08/13/13 2206  . lubiprostone (AMITIZA) capsule 8 mcg  8 mcg Oral BID WC Lurena Nida, NP   8 mcg at 08/14/13 0735  . magnesium hydroxide (MILK OF MAGNESIA) suspension 30 mL  30 mL Oral Daily PRN Lurena Nida, NP      . OLANZapine zydis (ZYPREXA) disintegrating tablet 5 mg  5 mg Oral Q8H PRN Elmarie Shiley, NP   5 mg at 08/12/13 1714  . sertraline (ZOLOFT) tablet 200 mg  200 mg Oral Daily Merian Capron, MD   200 mg at 08/14/13 0735  . traZODone (DESYREL) tablet 100 mg  100 mg Oral QHS PRN Merian Capron, MD   100 mg at 08/13/13 2209    Lab Results:  Results for orders placed during the hospital encounter of 08/09/13 (from the past 48 hour(s))  BASIC METABOLIC PANEL     Status: Abnormal   Collection Time    08/14/13  6:27 AM      Result Value Range   Sodium 138  137 - 147 mEq/L   Potassium 4.0  3.7 - 5.3 mEq/L   Chloride 102  96 - 112 mEq/L   CO2 30  19 - 32 mEq/L   Glucose, Bld 97  70 - 99 mg/dL   BUN <3 (*) 6 - 23 mg/dL   Creatinine, Ser 0.76  0.50 - 1.10 mg/dL   Calcium 9.1  8.4 - 10.5 mg/dL   GFR calc non Af Amer >90  >90 mL/min   GFR calc Af Amer >90  >90 mL/min   Comment: (NOTE)     The eGFR has been calculated using the CKD EPI equation.     This calculation has not been validated in all clinical situations.     eGFR's persistently <90 mL/min signify possible Chronic Kidney     Disease.     Performed at [facility]    Physical Findings: AIMS: Facial and Oral Movements Muscles of Facial Expression: None, normal Lips and Perioral Area: None, normal Jaw: None, normal Tongue: None, normal,Extremity Movements Upper (arms, wrists, hands, fingers): None, normal Lower (legs, knees, ankles, toes): None, normal, Trunk Movements Neck, shoulders, hips: None, normal, Overall Severity Severity of abnormal movements (highest score from questions above): None, normal Incapacitation due to abnormal movements: None, normal Patient's awareness of abnormal movements (rate only patient's report): No Awareness, Dental Status Current problems with teeth and/or dentures?: No Does patient usually wear dentures?: No  CIWA:  CIWA-Ar Total:  0 COWS:     Treatment Plan Summary: Daily contact with patient to assess and evaluate symptoms and progress in treatment Medication management  Plan: 1.Continue crisis management and stabilization.  2.Medication management: Reviewed with patient who stated no untoward effects. Continue Neurontin 100 mg BID for anxiety, Lamictal 225 mg daily for improved mood stability, Lithium 900 mg hs to reduce recurrent suicidal thoughts, Zoloft 200 mg Daily for depressive symptoms, Trazodone 100 mg hs prn for insomnia. Continue Haldol to $RemoveB'5mg'EAevYEHk$  po Qhs for psychosis. 3.Encouraged patient to attend groups and participate in group counseling sessions and activities.  4.Discharge plan in progress.  5. Address health issues: Continue Motrin 600 mg every six hours as needed for headache. Vitals reviewed and stable.   Medical Decision Making Problem Points:  Established problem, worsening (2), Review of last therapy session (1) and Review of psycho-social stressors (1) Data Points:  Review of medication regiment & side effects (2) Review of new medications or change in dosage (2)  I certify that inpatient services furnished can reasonably be expected to improve the patient's condition.    Elmarie Shiley, NP-C 08/14/2013, 12:01 PM  Patient seen, evaluated and I agree with notes by Nurse Practitioner. Corena Pilgrim, MD

## 2013-08-14 NOTE — Progress Notes (Signed)
Patient ID: [name], female   DOB: [birth year], 43 y.o.   MRN: [id number] D: Patient in room on approach. Pt was worried about loosing her bed at [facility] after discharge. Pt mood and affect was appropriate playing cards with peers in dayroom. Pt denies SI/HI/AVH and pain. Pt attended evening wrap up group and Interacted appropriately with peers. Pt denies any needs or concerns.  Cooperative with assessment. No acute distressed noted at this time.   A: Met with pt 1:1. Medications administered as prescribed. Writer encouraged pt to discuss feelings. Pt encouraged to come to staff with any question or concerns.   R: Patient remains safe. She is complaint with medications and denies any adverse reaction. Continue current POC.

## 2013-08-15 MED ORDER — HALOPERIDOL 5 MG PO TABS
10.0000 mg | ORAL_TABLET | Freq: Every day | ORAL | Status: DC
  Administered 2013-08-15 – 2013-08-16 (×2): 10 mg via ORAL
  Filled 2013-08-15 (×3): qty 2

## 2013-08-15 NOTE — Progress Notes (Signed)
D: Patient denies SI/HI/AVH. Pt affect is flat and mood is depressed.  Pt. Reported that her sleep was ok and appetite as well.  Pt. Spoke about her children this morning and how proud she is of them.  She has been up and moving around the milieu and interacting well with others.   A: Patient given emotional support from RN. Patient encouraged to come to staff with concerns and/or questions. Patient's medication routine continued. Patient's orders and plan of care reviewed.   R: Patient remains appropriate and cooperative. Will continue to monitor patient q15 minutes for safety.

## 2013-08-15 NOTE — Progress Notes (Deleted)
The focus of this group is to help patients review their daily goal of treatment and discuss progress on daily workbooks. Pt shared that her goal for today was to learn to cope with feelings of anger, especially as they arise when she has auditory hallucinations. Pt verbalized that she can cope with anger in most social situations by walking away. Pt also stated that she can cope with anger by asking herself "is it really worth it?"

## 2013-08-15 NOTE — Progress Notes (Signed)
Pt. Attended morning group, patient engaged in discussion and was attentive to others.  Pt. Receptive to information being shared.

## 2013-08-15 NOTE — Progress Notes (Signed)
The focus of this group is to help patients review their daily goal of treatment and discuss progress on daily workbooks. Pt shared that her goal for today was to learn to cope with feelings of anger, especially as they arise when she has auditory hallucinations. Pt verbalized that she can cope with anger in most social situations by walking away. Pt also stated that she can cope with anger by asking herself "is it really worth it?" 

## 2013-08-15 NOTE — [facility] Group Notes (Signed)
Krisha Beegle [facility] Group Notes:  (Counselor/Nursing/MHT/Case Management/Adjunct)  08/15/2013 1:15PM  Type of Therapy:  Group Therapy  Participation Level:  None  Participation Quality:  Appropriate  Affect:  Flat  Cognitive:  Oriented  Insight:  Limited  Engagement in Group:  Limited  Engagement in Therapy:  Limited  Modes of Intervention:  Discussion, Exploration and Socialization  Summary of Progress/Problems: The topic for group was balance in life.  Pt participated in the discussion about when their life was in balance and out of balance and how this feels.  Pt discussed ways to get back in balance and short term goals they can work on to get where they want to be. Sat quietly throughout the first 10 minutes.  Abruptly got up and left.  Did not return.   Trish Mage 08/15/2013 12:56 PM

## 2013-08-15 NOTE — Progress Notes (Signed)
Patient ID: [name], female   DOB: February 23, 1971, 43 y.o.   MRN: [id number] [facility] MD Progress Note  08/15/2013 11:21 AM [name]  MRN:  [id number] Subjective:  Patient states "I am still hearing voices and  feeling depressed.''  Objective: Patient reports ongoing auditory hallucination, paranoia and depressive symptoms. She states that her medications are helping but wants her Haldol increased to address her psychosis. She continues to have crying spells and  feels hopeless about her future. She denies cravings for drugs and alcohol . Patient is compliant with her medications and has been scheduled for placement in [facility] for drug and alcohol rehabilitation. 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 Hypertension   . Leg pain   . Carpal tunnel syndrome    Axis IV: economic problems, occupational problems, other psychosocial or environmental problems and problems with primary support group Axis V: 41-50 serious symptoms  ADL's:  Intact  Sleep: Fair  Appetite:  Fair  Suicidal Ideation: yes Denies plan or intent.  Homicidal Ideation:  Plan:  no  Intent:  no AEB (as evidenced by):  Psychiatric Specialty Exam: Physical Exam  Vitals reviewed.   Review of Systems  Constitutional: Negative.   Eyes: Negative.   Respiratory: Negative.   Cardiovascular: Negative.   Gastrointestinal: Negative.   Genitourinary: Negative.   Musculoskeletal: Negative.   Skin: Negative.   Neurological: Negative.   Endo/Heme/Allergies: Negative.   Psychiatric/Behavioral: Positive for depression, suicidal ideas,  hallucinations and substance abuse. Negative for memory loss. The patient is nervous/anxious.     Blood pressure 115/73, pulse 86, temperature 98.1 F (36.7 C), temperature source Oral, resp. rate 18, height 5' 4.5" (1.638 m), weight 67.586 kg (149 lb), last menstrual period 07/22/2013.Body mass index is 25.19 kg/(m^2).  General Appearance: Casual  Eye Contact::  Minimal  Speech:  Slow  Volume:  Decreased  Mood:  Dysphoric  Affect:  Tearful  Thought Process:  Circumstantial  Orientation:  Full (Time, Place, and Person)  Thought Content:  Hallucinations: Auditory and Rumination  Suicidal Thoughts:  No  Homicidal Thoughts:  No  Memory:  Recent;   Fair  Judgement:  Impaired  Insight:  Lacking  Psychomotor Activity:  Decreased  Concentration:  Fair  Recall:  Poor  Fund of Knowledge:Fair  Language: Fair  Akathisia:  Negative  Handed:  Right  AIMS (if indicated):     Assets:  Desire for Improvement Social Support  Sleep:  Number of Hours: 6.25   Musculoskeletal: Strength & Muscle Tone: within normal limits Gait & Station: normal Patient leans: N/A  Current Medications: Current Facility-Administered Medications  Medication Dose Route Frequency Provider Last Rate Last Dose  . acetaminophen (TYLENOL) tablet 650 mg  650 mg Oral Q6H PRN Lurena Nida, NP   650 mg at 08/12/13 5956  . alum & mag hydroxide-simeth (MAALOX/MYLANTA) 200-200-20 MG/5ML suspension 30 mL  30 mL Oral Q4H PRN Lurena Nida, NP      . gabapentin (NEURONTIN) capsule 100 mg  100 mg Oral BID Lurena Nida, NP   100 mg at 08/15/13 0758  . haloperidol (HALDOL) tablet 10 mg  10 mg Oral QHS Rosaleah Person      .  ibuprofen (ADVIL,MOTRIN) tablet 600 mg  600 mg Oral Q6H PRN Elmarie Shiley, NP   600 mg at 08/12/13 1713  . lamoTRIgine (LAMICTAL) tablet 225 mg  225 mg Oral Daily Lurena Nida, NP   225 mg at 08/15/13 0757  . lithium carbonate capsule 900 mg  900 mg Oral QHS Lurena Nida, NP   900 mg at 08/14/13 2141  .  lubiprostone (AMITIZA) capsule 8 mcg  8 mcg Oral BID WC Lurena Nida, NP   8 mcg at 08/15/13 0757  . magnesium hydroxide (MILK OF MAGNESIA) suspension 30 mL  30 mL Oral Daily PRN Lurena Nida, NP      . OLANZapine zydis (ZYPREXA) disintegrating tablet 5 mg  5 mg Oral Q8H PRN Elmarie Shiley, NP   5 mg at 08/15/13 0805  . sertraline (ZOLOFT) tablet 200 mg  200 mg Oral Daily Merian Capron, MD   200 mg at 08/15/13 0758  . traZODone (DESYREL) tablet 100 mg  100 mg Oral QHS PRN Merian Capron, MD   100 mg at 08/14/13 2141    Lab Results:  Results for orders placed during the hospital encounter of 08/09/13 (from the past 48 hour(s))  BASIC METABOLIC PANEL     Status: Abnormal   Collection Time    08/14/13  6:27 AM      Result Value Range   Sodium 138  137 - 147 mEq/L   Potassium 4.0  3.7 - 5.3 mEq/L   Chloride 102  96 - 112 mEq/L   CO2 30  19 - 32 mEq/L   Glucose, Bld 97  70 - 99 mg/dL   BUN <3 (*) 6 - 23 mg/dL   Creatinine, Ser 0.76  0.50 - 1.10 mg/dL   Calcium 9.1  8.4 - 10.5 mg/dL   GFR calc non Af Amer >90  >90 mL/min   GFR calc Af Amer >90  >90 mL/min   Comment: (NOTE)     The eGFR has been calculated using the CKD EPI equation.     This calculation has not been validated in all clinical situations.     eGFR's persistently <90 mL/min signify possible Chronic Kidney     Disease.     Performed at [facility]    Physical Findings: AIMS: Facial and Oral Movements Muscles of Facial Expression: None, normal Lips and Perioral Area: None, normal Jaw: None, normal Tongue: None, normal,Extremity Movements Upper (arms, wrists, hands, fingers): None, normal Lower (legs, knees, ankles, toes): None, normal, Trunk Movements Neck, shoulders, hips: None, normal, Overall Severity Severity of abnormal movements (highest score from questions above): None, normal Incapacitation due to abnormal movements: None, normal Patient's awareness of abnormal movements (rate only patient's  report): No Awareness, Dental Status Current problems with teeth and/or dentures?: No Does patient usually wear dentures?: No  CIWA:  CIWA-Ar Total: 0 COWS:     Treatment Plan Summary: Daily contact with patient to assess and evaluate symptoms and progress in treatment Medication management  Plan: 1.Continue crisis management and stabilization.  2.Medication management: Reviewed with patient who stated no untoward effects. Continue Neurontin 100 mg BID for anxiety, Lamictal 225 mg daily for improved mood stability, Lithium 900 mg hs to reduce recurrent suicidal thoughts, Zoloft 200 mg Daily for depressive symptoms, Trazodone 100 mg hs prn for insomnia. Increase  Haldol to $RemoveB'10mg'elNmfMph$  po Qhs for psychosis. 3.Encouraged patient to attend groups and participate in group counseling sessions and activities.  4.Discharge plan in progress.  5. Address health issues: Order Motrin 600 mg every six hours as needed for headache.   Medical Decision Making Problem Points:  Established problem, improving (1), Review of last therapy session (1) and Review of psycho-social stressors (1) Data Points:  Review and summation of old records (2) Review of medication regiment & side effects (2) Review of new medications or change in dosage (2)  I certify that inpatient services furnished can reasonably be expected to improve the patient's condition.   Corena Pilgrim, MD 08/15/2013, 11:21 AM

## 2013-08-15 NOTE — Progress Notes (Signed)
Patient ID: [name], female   DOB: [birth year], 43 y.o.   MRN: [id number] D: Patient in room on approach. Pt came up to window wanting medication for the voices. Pt stated she is hearing voices and is getting increasinlg anxious. Pt mood and affect appeared anxious. Pt denies SI/HI/VH and pain. Pt attended evening wrap up group and engaged in discussion. Pt denies any needs or concerns.  Cooperative with assessment. No acute distressed noted at this time.   A: Met with pt 1:1. Medications administered as prescribed. Writer encouraged pt to discuss feelings. Pt encouraged to come to staff with any question or concerns.   R: Patient remains safe. She is complaint with medications and denies any adverse reaction. Continue current POC.

## 2013-08-16 DIAGNOSIS — R45851 Suicidal ideations: Secondary | ICD-10-CM

## 2013-08-16 NOTE — [facility] Suicide Risk Assessment (Signed)
[place] INPATIENT:  Family/Significant Other Suicide Prevention Education  Suicide Prevention Education:  Education Completed; Daughter, Haze Boyden [phone] has been identified by the patient as the family member/significant other with whom the patient will be residing, and identified as the person(s) who will aid the patient in the event of a mental health crisis (suicidal ideations/suicide attempt).  With written consent from the patient, the family member/significant other has been provided the following suicide prevention education, prior to the and/or following the discharge of the patient.  The suicide prevention education provided includes the following:  Suicide risk factors  Suicide prevention and interventions  National Suicide Hotline telephone number  [facility] assessment telephone number  [facility] Emergency Assistance [place] and/or Residential Mobile Crisis Unit telephone number  Request made of family/significant other to:  Remove weapons (e.g., guns, rifles, knives), all items previously/currently identified as safety concern.    Remove drugs/medications (over-the-counter, prescriptions, illicit drugs), all items previously/currently identified as a safety concern.  The family member/significant other verbalizes understanding of the suicide prevention education information provided.  The family member/significant other agrees to remove the items of safety concern listed above.  Cassandria Santee 08/16/2013, 8:01 AM

## 2013-08-16 NOTE — Progress Notes (Signed)
Patient ID: [name], female   DOB: October 20, 1970, 43 y.o.   MRN: [id number] D. Patient presents with depressed mood, affect congruent. In am patient reports ''I'm doing better, the voices are getting better, I can hardly tell what they are saying sometimes '' Patient reports good sleep, and denies any SI/HI or further concerns at this time. Pt completed self inventory and rates her depression at 6/10 on depression scale 10 being worst depressed 1 being least. . She reports that she did wake up in the night hearing voices but that she was able to go back to sleep. She continues to endorse vague fleeting SI but denies any plan and verbally contracts for safety. A. Medications given as ordered. Support and encouragement provided.Discussed above information with Treatment team and Dr. Darleene Cleaver. R. Patient with no further voiced concerns. In no acute distress at this time. Will continue to monitor q 15 minutes for safety.

## 2013-08-16 NOTE — Tx Team (Signed)
  Interdisciplinary Treatment Plan Update   Date Reviewed:  08/16/2013  Time Reviewed:  8:05 AM  Progress in Treatment:   Attending groups: Yes Participating in groups: Yes Taking medication as prescribed: Yes  Tolerating medication: Yes Family/Significant other contact made: No  Patient understands diagnosis: Yes Discussing patient identified problems/goals with staff: Yes  See initial care plan Medical problems stabilized or resolved: Yes Denies suicidal/homicidal ideation: Yes  In tx team Patient has not harmed self or others: Yes  For review of initial/current patient goals, please see plan of care.  Estimated Length of Stay:  D/C on Monday at [place]   Reason for Continuation of Hospitalization:   New Problems/Goals identified:  N/A  Discharge Plan or Barriers:   Family will pick up pt and go directly to [facility] on Monday morning   Additional Comments:  No medication changes today.  Pt rates depression and anxiety at 5.  Appears to be at baseline level of functioning.   Attendees:  Signature: Corena Pilgrim, MD 08/16/2013 8:05 AM   Signature: Ripley Fraise, LCSW 08/16/2013 8:05 AM  Signature: Elmarie Shiley, NP 08/16/2013 8:05 AM  Signature: Mayra Neer, RN 08/16/2013 8:05 AM  Signature: Darrol Angel, RN 08/16/2013 8:05 AM  Signature:  08/16/2013 8:05 AM  Signature:   08/16/2013 8:05 AM  Signature:    Signature:    Signature:    Signature:    Signature:    Signature:      Scribe for Treatment Team:   Ripley Fraise, LCSW  08/16/2013 8:05 AM

## 2013-08-16 NOTE — BHH Group Notes (Signed)
[facility] LCSW Aftercare Discharge Planning Group Note   08/16/2013 8:17 AM  Participation Quality:  Engaged  Mood/Affect:  Depressed  Depression Rating:  5  Anxiety Rating:  5  Thoughts of Suicide:  No Will you contract for safety?   NA  Current AVH:  "The voices are mellowing"  Plan for Discharge/Comments:  Plans to d/c on Monday.  States sister will pick her up at 7AM to take her there.  Appears more upbeat since finding out she could stay and go directly to [facility].  Transportation Means: daughter  Supports: daughter  Trish Mage

## 2013-08-16 NOTE — Progress Notes (Signed)
Patient ID: [name], female   DOB: [birth year], 43 y.o.   MRN: [id number] [facility] MD Progress Note  08/16/2013 10:58 AM [name]  MRN:  [id number] Subjective:  Patient states "The voices are just starting to decrease. I am feeling more hopeful about my future since finding out I can go straight to [facility] Monday morning. I'm just worried about how my future will work out. I was really doing poorly before I came here."   Objective: Patient reports decreasing auditory hallucination, paranoia and depressive symptoms. She reports now feeling the effects of Haldol with a reduction in the frequency of hearing voices. [name] continues to struggle with symptoms of depression and anxiety. Today she rates her depression at six and anxiety level at seven. Patient worries about being able to stay clean after treatment but is motivated to live for her eight children. Patient is complaint with her medication regimen and has not verbalized any adverse effects.   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 Hypertension   . Leg pain   . Carpal tunnel syndrome    Axis IV: economic problems, occupational problems, other psychosocial or environmental problems and problems with primary support group Axis V: 41-50 serious symptoms  ADL's:  Intact  Sleep: Fair  Appetite:  Fair  Suicidal Ideation: yes Denies plan or intent.  Homicidal Ideation:  Plan:  no  Intent:  no AEB (as evidenced by):  Psychiatric Specialty Exam: Physical Exam  Vitals reviewed.   Review of Systems  Constitutional: Negative.   Eyes: Negative.    Respiratory: Negative.   Cardiovascular: Negative.   Gastrointestinal: Negative.   Genitourinary: Negative.   Musculoskeletal: Negative.   Skin: Negative.   Neurological: Negative.   Endo/Heme/Allergies: Negative.   Psychiatric/Behavioral: Positive for depression, suicidal ideas, hallucinations and substance abuse. Negative for memory loss. The patient is nervous/anxious. The patient does not have insomnia.     Blood pressure 122/80, pulse 85, temperature 98.7 F (37.1 C), temperature source Oral, resp. rate 16, height 5' 4.5" (1.638 m), weight 67.586 kg (149 lb), last menstrual period 07/22/2013.Body mass index is 25.19 kg/(m^2).  General Appearance: Casual  Eye Contact::  Fair  Speech:  Clear and Coherent  Volume:  Decreased  Mood:  Depressed  Affect:  Flat  Thought Process:  Goal Directed and Intact  Orientation:  Full (Time, Place, and Person)  Thought Content:  Hallucinations: Auditory and Rumination  Suicidal Thoughts:  No  Homicidal Thoughts:  No  Memory:  Recent;   Fair  Judgement:  Impaired  Insight:  Lacking  Psychomotor Activity:  Decreased  Concentration:  Fair  Recall:  Poor  Fund of Knowledge:Fair  Language: Fair  Akathisia:  Negative  Handed:  Right  AIMS (if indicated):     Assets:  Desire for Improvement Social Support  Sleep:  Number of Hours: 6   Musculoskeletal: Strength & Muscle Tone: within normal limits Gait & Station: normal Patient leans: N/A  Current Medications: Current Facility-Administered Medications  Medication Dose Route Frequency Provider Last Rate Last Dose  . acetaminophen (TYLENOL) tablet 650 mg  650 mg Oral Q6H PRN Lurena Nida, NP   650 mg at 08/12/13  Mía.Bucco  . alum & mag hydroxide-simeth (MAALOX/MYLANTA) 200-200-20 MG/5ML suspension 30 mL  30 mL Oral Q4H PRN Lurena Nida, NP      . gabapentin (NEURONTIN) capsule 100 mg  100 mg Oral BID Lurena Nida, NP   100 mg at 08/16/13 0757  . haloperidol (HALDOL) tablet 10 mg  10 mg Oral  QHS Annamae Shivley   10 mg at 08/15/13 2111  . ibuprofen (ADVIL,MOTRIN) tablet 600 mg  600 mg Oral Q6H PRN Elmarie Shiley, NP   600 mg at 08/12/13 1713  . lamoTRIgine (LAMICTAL) tablet 225 mg  225 mg Oral Daily Lurena Nida, NP   225 mg at 08/16/13 0757  . lithium carbonate capsule 900 mg  900 mg Oral QHS Lurena Nida, NP   900 mg at 08/15/13 2110  . lubiprostone (AMITIZA) capsule 8 mcg  8 mcg Oral BID WC Lurena Nida, NP   8 mcg at 08/16/13 0757  . magnesium hydroxide (MILK OF MAGNESIA) suspension 30 mL  30 mL Oral Daily PRN Lurena Nida, NP      . OLANZapine zydis (ZYPREXA) disintegrating tablet 5 mg  5 mg Oral Q8H PRN Elmarie Shiley, NP   5 mg at 08/15/13 2005  . sertraline (ZOLOFT) tablet 200 mg  200 mg Oral Daily Merian Capron, MD   200 mg at 08/16/13 0757  . traZODone (DESYREL) tablet 100 mg  100 mg Oral QHS PRN Merian Capron, MD   100 mg at 08/15/13 2113    Lab Results:  No results found for this or any previous visit (from the past 48 hour(s)).  Physical Findings: AIMS: Facial and Oral Movements Muscles of Facial Expression: None, normal Lips and Perioral Area: None, normal Jaw: None, normal Tongue: None, normal,Extremity Movements Upper (arms, wrists, hands, fingers): None, normal Lower (legs, knees, ankles, toes): None, normal, Trunk Movements Neck, shoulders, hips: None, normal, Overall Severity Severity of abnormal movements (highest score from questions above): None, normal Incapacitation due to abnormal movements: None, normal Patient's awareness of abnormal movements (rate only patient's report): No Awareness, Dental Status Current problems with teeth and/or dentures?: No Does patient usually wear dentures?: No  CIWA:  CIWA-Ar Total: 0 COWS:     Treatment Plan Summary: Daily contact with patient to assess and evaluate symptoms and progress in treatment Medication management  Plan: 1.Continue crisis management and stabilization.  2.Medication management: Reviewed  with patient who stated no untoward effects. Continue Neurontin 100 mg BID for anxiety, Lamictal 225 mg daily for improved mood stability, Lithium 900 mg hs to reduce recurrent suicidal thoughts, Zoloft 200 mg Daily for depressive symptoms, Trazodone 100 mg hs prn for insomnia, Haldol 10mg  po Qhs for psychosis. 3.Encouraged patient to attend groups and participate in group counseling sessions and activities.  4.Discharge plan in progress. Will discharge 08/19/13 at 7 am to [facility] and be picked up by sister.  5. Address health issues: Vitals reviewed and stable.   Medical Decision Making Problem Points:  Established problem, improving (1), Review of last therapy session (1) and Review of psycho-social stressors (1) Data Points:  Review of medication regiment & side effects (2) Review of new medications or change in dosage (2)  I certify that inpatient services furnished can reasonably be expected to improve the patient's condition.   Elmarie Shiley, NP-C 08/16/2013, 10:58 AM  Patient seen, evaluated and I agree with notes by Nurse Practitioner. Corena Pilgrim, MD

## 2013-08-16 NOTE — BHH Group Notes (Signed)
[facility] LCSW Group Therapy  08/16/2013 1:20 PM  Type of Therapy:  Group Therapy   Participation Level:  Minimal  Participation Quality:  Appropriate  Affect:  Flat  Cognitive:  Appropriate  Insight:  Improving  Engagement in Therapy:  Improving  Modes of Intervention:  Discussion and Socialization   Summary of Progress/Problems:  Chaplain was here to lead a group on themes of hope and courage. [name] presented with flat affect and poor eye contact; however she stated that she was experiencing menstrual pain.    [name] stated that listening is receiving information.  She acknowledged that she has selective listening and she gets distracted easily.  Left group 15 minutes early to talk to the nurse about her menstrual pain.    [name]   08/16/2013  1:20 PM

## 2013-08-16 NOTE — Progress Notes (Signed)
Adult Psychoeducational Group Note  Date:  08/16/2013 Time:  10:00AM  Group Topic/Focus:  Relapse Prevention Planning:   The focus of this group is to define relapse and discuss the need for planning to combat relapse.  Participation Level:  Did Not Attend  Additional Comments:  Pt did not attend the session.   Zoila Shutter R 08/16/2013, 11:00 AM

## 2013-08-16 NOTE — Progress Notes (Signed)
[facility] Adult Case Management Discharge Plan :  Will you be returning to the same living situation after discharge: No. Daughter plans to pick up pt at 7am on Monday and will directly take pt to [facility]   At discharge, do you have transportation home?:Yes,  family Do you have the ability to pay for your medications:Yes,  Mental Health  Release of information consent forms completed and in the chart;  Patient's signature needed at discharge.  Patient to Follow up at: Follow-up Information   Follow up with [facility] . (Arrive at [facility] for your screening for possible admission appointment)    Contact information:   [place] [phone]      Follow up with [facility]. (Go to the walk-in clinic M-F between 8 and 9AM for your hospital follow up appointment)    Contact information:   [place] [phone]      Patient denies SI/HI:   Yes,  Yes    Safety Planning and Suicide Prevention discussed:  Yes,  Yes  Cassandria Santee 08/16/2013, 3:05 PM

## 2013-08-17 MED ORDER — CHLORPROMAZINE HCL 25 MG PO TABS
25.0000 mg | ORAL_TABLET | Freq: Every day | ORAL | Status: DC
  Administered 2013-08-17 – 2013-08-18 (×2): 25 mg via ORAL
  Filled 2013-08-17 (×4): qty 1

## 2013-08-17 MED ORDER — CHLORPROMAZINE HCL 25 MG PO TABS
25.0000 mg | ORAL_TABLET | Freq: Once | ORAL | Status: AC
  Administered 2013-08-17: 25 mg via ORAL
  Filled 2013-08-17: qty 1

## 2013-08-17 NOTE — BHH Group Notes (Signed)
[facility] Group Notes:  (Clinical Social Work)  08/17/2013  11:00-11:45AM  Summary of Progress/Problems:   The main focus of today's process group was for the patient to identify ways in which they have in the past sabotaged their own recovery and reasons they may have done this/what they received from doing it.  We then worked to identify a specific plan to avoid doing this when discharged from the hospital for this admission.  The patient expressed that she uses alcohol to avoid having negative feelings, and that she is going to rehab from this hospital on Monday.  She attempted to respond beneficially to another patient's anger and mania, but was cut off.  CSW recommended to nurse that patient program on [address] for remainder of her stay, as she is now stable enough for that.  Type of Therapy:  Group Therapy - Process  Participation Level:  Active  Participation Quality:  Attentive and Sharing  Affect:  Blunted  Cognitive:  Appropriate and Oriented  Insight:  Engaged  Engagement in Therapy:  Engaged  Modes of Intervention:  Clarification, Education, Exploration, Discussion  Selmer Dominion, LCSW 08/17/2013, 12:25 PM

## 2013-08-17 NOTE — Progress Notes (Signed)
[facility] MD Progress Note  08/17/2013 2:11 PM [name]  MRN:  [id number] Subjective:  I still hear voices.  Sometime I feel they're telling me to do bad things.     Objective: Patient was seen chart reviewed.  Patient is noticing increased irritability anger and hallucination.  She does not like Haldol.  She did not sleep last night.  Her roommate did not let her sleep.  She is taking medication but does not feel that it is helping .  Patient remains a paranoid, agitated and continued to endorse hallucination.  She feels Haldol is making her more anxious and restless.  She endorsed passive suicidal thoughts but denies any homicidal thoughts.  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 Hypertension   . Leg pain   . Carpal tunnel syndrome    Axis IV: economic problems, occupational problems, other psychosocial or environmental problems and problems with primary support group Axis V: 41-50 serious symptoms  ADL's:  Intact  Sleep: Fair  Appetite:  Fair  Suicidal Ideation: yes Denies plan or intent.  Homicidal Ideation:  Plan:  no  Intent:  no AEB (as evidenced by):  Psychiatric Specialty Exam: Physical Exam  Vitals reviewed.   Review of Systems  Constitutional: Negative.   Eyes: Negative.   Respiratory: Negative.   Cardiovascular: Negative.   Gastrointestinal: Negative.   Genitourinary: Negative.   Musculoskeletal: Negative.   Skin: Negative.   Neurological: Negative.   Endo/Heme/Allergies: Negative.   Psychiatric/Behavioral: Positive for depression, suicidal ideas, hallucinations and substance  abuse. Negative for memory loss. The patient is nervous/anxious. The patient does not have insomnia.     Blood pressure 114/79, pulse 83, temperature 97.7 F (36.5 C), temperature source Oral, resp. rate 20, height 5' 4.5" (1.638 m), weight 149 lb (67.586 kg), last menstrual period 07/22/2013.Body mass index is 25.19 kg/(m^2).  General Appearance: Casual  Eye Contact::  Fair  Speech:  Clear and Coherent  Volume:  Decreased  Mood:  Depressed  Affect:  Flat  Thought Process:  Goal Directed and Intact  Orientation:  Full (Time, Place, and Person)  Thought Content:  Hallucinations: Auditory and Rumination  Suicidal Thoughts:  No  Homicidal Thoughts:  No  Memory:  Recent;   Fair  Judgement:  Impaired  Insight:  Lacking  Psychomotor Activity:  Decreased  Concentration:  Fair  Recall:  Poor  Fund of Knowledge:Fair  Language: Fair  Akathisia:  Negative  Handed:  Right  AIMS (if indicated):     Assets:  Desire for Improvement Social Support  Sleep:  Number of Hours: 6   Musculoskeletal: Strength & Muscle Tone: within normal limits Gait & Station: normal Patient leans: N/A  Current Medications: Current Facility-Administered Medications  Medication Dose Route Frequency Provider Last Rate Last Dose  . acetaminophen (TYLENOL) tablet 650 mg  650 mg Oral Q6H PRN Lurena Nida, NP   650 mg at 08/12/13 1245  . alum & mag hydroxide-simeth (MAALOX/MYLANTA) 200-200-20 MG/5ML suspension 30 mL  30 mL Oral Q4H PRN Lurena Nida, NP      . chlorproMAZINE (THORAZINE) tablet 25 mg  25 mg Oral Once Kathlee Nations, MD      .  chlorproMAZINE (THORAZINE) tablet 25 mg  25 mg Oral QHS Kathlee Nations, MD      . gabapentin (NEURONTIN) capsule 100 mg  100 mg Oral BID Lurena Nida, NP   100 mg at 08/17/13 0755  . ibuprofen (ADVIL,MOTRIN) tablet 600 mg  600 mg Oral Q6H PRN Elmarie Shiley, NP   600 mg at 08/17/13 0755  . lamoTRIgine (LAMICTAL) tablet 225 mg  225 mg Oral Daily Lurena Nida, NP   225 mg at 08/17/13  0755  . lithium carbonate capsule 900 mg  900 mg Oral QHS Lurena Nida, NP   900 mg at 08/16/13 2107  . lubiprostone (AMITIZA) capsule 8 mcg  8 mcg Oral BID WC Lurena Nida, NP   8 mcg at 08/17/13 0755  . magnesium hydroxide (MILK OF MAGNESIA) suspension 30 mL  30 mL Oral Daily PRN Lurena Nida, NP      . OLANZapine zydis (ZYPREXA) disintegrating tablet 5 mg  5 mg Oral Q8H PRN Elmarie Shiley, NP   5 mg at 08/17/13 0755  . sertraline (ZOLOFT) tablet 200 mg  200 mg Oral Daily Merian Capron, MD   200 mg at 08/17/13 0755  . traZODone (DESYREL) tablet 100 mg  100 mg Oral QHS PRN Merian Capron, MD   100 mg at 08/16/13 2108    Lab Results:  No results found for this or any previous visit (from the past 48 hour(s)).  Physical Findings: AIMS: Facial and Oral Movements Muscles of Facial Expression: None, normal Lips and Perioral Area: None, normal Jaw: None, normal Tongue: None, normal,Extremity Movements Upper (arms, wrists, hands, fingers): None, normal Lower (legs, knees, ankles, toes): None, normal, Trunk Movements Neck, shoulders, hips: None, normal, Overall Severity Severity of abnormal movements (highest score from questions above): None, normal Incapacitation due to abnormal movements: None, normal Patient's awareness of abnormal movements (rate only patient's report): No Awareness, Dental Status Current problems with teeth and/or dentures?: No Does patient usually wear dentures?: No  CIWA:  CIWA-Ar Total: 0 COWS:     Treatment Plan Summary: Daily contact with patient to assess and evaluate symptoms and progress in treatment Medication management  Plan: 1.Continue crisis management and stabilization.  2.Medication management: Discontinue Haldol and we will try Thorazine 25 mg twice a day.  Continue Neurontin 100 mg BID for anxiety, Lamictal 225 mg daily for improved mood stability, Lithium 900 mg hs to reduce recurrent suicidal thoughts, Zoloft 200 mg Daily for depressive symptoms,  Trazodone 100 mg hs prn for insomnia. 3.Encouraged patient to attend groups and participate in group counseling sessions and activities.  4.Discharge plan in progress. 5. Address health issues: Vitals reviewed and stable.   Medical Decision Making Problem Points:  Established problem, improving (1), Review of last therapy session (1) and Review of psycho-social stressors (1) Data Points:  Review of medication regiment & side effects (2) Review of new medications or change in dosage (2)  I certify that inpatient services furnished can reasonably be expected to improve the patient's condition.   Therese Rocco T. 08/17/2013, 2:11 PM

## 2013-08-17 NOTE — Progress Notes (Signed)
Patient ID: [name], female   DOB: May 10, 1971, 43 y.o.   MRN: [id number] Psychoeducational Group Note  Date:  08/17/2013 [id number]  Group Topic/Focus:  Identifying Needs:   The focus of this group is to help patients identify their personal needs that have been historically problematic and identify healthy behaviors to address their needs.  Participation Level:  Active  Participation Quality:  Appropriate  Affect:  Appropriate  Cognitive:  Appropriate  Insight:  Engaged  Engagement in Group:  Engaged  Additional Comments:  Coping skills.   [name] 08/17/2013,10:24 AM

## 2013-08-17 NOTE — Progress Notes (Signed)
Adult Psychoeducational Group Note  Date:  08/17/2013 Time:  8:00 pm  Group Topic/Focus:  Wrap-Up Group:   The focus of this group is to help patients review their daily goal of treatment and discuss progress on daily workbooks.  Participation Level:  Active  Participation Quality:  Appropriate and Sharing  Affect:  Appropriate  Cognitive:  Appropriate  Insight: Appropriate  Engagement in Group:  Engaged  Modes of Intervention:  Discussion, Education, Socialization and Support  Additional Comments:  Pt stated that her day got better once she saw the doctor and had her medication adjusted. Pt stated that she has been struggling with anger issues. Pt stated that she always feels angry. Pt stated that she tries to remove herself from situation when possible. Pt stated that she will also attend NA/AA meetings.   Wynetta Emery, Jocelin Schuelke 08/17/2013, 9:29 PM

## 2013-08-17 NOTE — Progress Notes (Signed)
Patient ID: [name], female   DOB: [birth year], 43 y.o.   MRN: [id number] D: Patient stated she is doing well. Pt mood and affect appeared depressed and anxious. Pt endorsed auditory hallucination without command. Pt denies SI/HI/VH and pain. Pt attended evening wrap up group and engaged in discussion. Pt denies any needs or concerns.  Cooperative with assessment. No acute distressed noted at this time.   A: Met with pt 1:1. Medications administered as prescribed. Writer encouraged pt to discuss feelings. Pt encouraged to come to staff with any question or concerns.   R: Patient remains safe. She is complaint with medications and denies any adverse reaction. Continue current POC.
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
## 2013-08-17 NOTE — Progress Notes (Signed)
Patient ID: [name], female   DOB: June 09, 1971, 43 y.o.   MRN: [id number] Psychoeducational Group Note  Date:  08/17/2013 Time:0915am  Group Topic/Focus:  Identifying Needs:   The focus of this group is to help patients identify their personal needs that have been historically problematic and identify healthy behaviors to address their needs.  Participation Level:  Active  Participation Quality:  Appropriate  Affect:  Appropriate  Cognitive:  Appropriate  Insight:  Engaged  Engagement in Group:  Engaged  Additional Comments:  Inventory group   Pricilla Larsson 08/17/2013,10:06 AM

## 2013-08-18 MED ORDER — TRAZODONE HCL 100 MG PO TABS
100.0000 mg | ORAL_TABLET | Freq: Every evening | ORAL | Status: DC | PRN
  Administered 2013-08-18: 100 mg via ORAL
  Filled 2013-08-18: qty 1
  Filled 2013-08-18: qty 14

## 2013-08-18 MED ORDER — LAMOTRIGINE 25 MG PO TABS: 225.0000 mg | ORAL_TABLET | Freq: Every day | ORAL | Status: DC

## 2013-08-18 MED ORDER — CHLORPROMAZINE HCL 25 MG PO TABS: 25.0000 mg | ORAL_TABLET | Freq: Every day | ORAL | Status: DC

## 2013-08-18 MED ORDER — LITHIUM CARBONATE 300 MG PO CAPS: 900.0000 mg | ORAL_CAPSULE | Freq: Every day | ORAL | Status: DC

## 2013-08-18 MED ORDER — SERTRALINE HCL 100 MG PO TABS: 200.0000 mg | ORAL_TABLET | Freq: Every day | ORAL | Status: DC

## 2013-08-18 MED ORDER — MAGNESIUM CITRATE PO SOLN
1.0000 | Freq: Once | ORAL | Status: AC
  Administered 2013-08-18: 1 via ORAL

## 2013-08-18 MED ORDER — LAMOTRIGINE 25 MG PO TABS
25.0000 mg | ORAL_TABLET | Freq: Every day | ORAL | Status: DC
  Filled 2013-08-18 (×2): qty 14

## 2013-08-18 MED ORDER — FAMOTIDINE 20 MG PO TABS
20.0000 mg | ORAL_TABLET | Freq: Two times a day (BID) | ORAL | Status: DC | PRN
  Filled 2013-08-18: qty 1

## 2013-08-18 MED ORDER — DOCUSATE SODIUM 100 MG PO CAPS
100.0000 mg | ORAL_CAPSULE | Freq: Two times a day (BID) | ORAL | Status: DC
  Filled 2013-08-18 (×2): qty 28

## 2013-08-18 MED ORDER — HYDROXYZINE HCL 25 MG PO TABS
25.0000 mg | ORAL_TABLET | Freq: Two times a day (BID) | ORAL | Status: DC
  Filled 2013-08-18 (×2): qty 28

## 2013-08-18 MED ORDER — CHLORPROMAZINE HCL 25 MG PO TABS
25.0000 mg | ORAL_TABLET | Freq: Every day | ORAL | Status: DC
  Filled 2013-08-18: qty 14

## 2013-08-18 MED ORDER — SERTRALINE HCL 100 MG PO TABS
200.0000 mg | ORAL_TABLET | Freq: Every day | ORAL | Status: DC
  Filled 2013-08-18: qty 28

## 2013-08-18 MED ORDER — TRAZODONE HCL 100 MG PO TABS: 100.0000 mg | ORAL_TABLET | Freq: Every evening | ORAL | Status: DC | PRN

## 2013-08-18 MED ORDER — QUETIAPINE FUMARATE 100 MG PO TABS: 100.0000 mg | ORAL_TABLET | Freq: Two times a day (BID) | ORAL | Status: DC

## 2013-08-18 MED ORDER — LUBIPROSTONE 8 MCG PO CAPS
8.0000 ug | ORAL_CAPSULE | Freq: Two times a day (BID) | ORAL | Status: DC
  Filled 2013-08-18: qty 28

## 2013-08-18 MED ORDER — LITHIUM CARBONATE 300 MG PO CAPS
900.0000 mg | ORAL_CAPSULE | Freq: Every day | ORAL | Status: DC
  Filled 2013-08-18: qty 42

## 2013-08-18 MED ORDER — GABAPENTIN 100 MG PO CAPS: 100.0000 mg | ORAL_CAPSULE | Freq: Two times a day (BID) | ORAL | Status: DC

## 2013-08-18 MED ORDER — GABAPENTIN 100 MG PO CAPS
100.0000 mg | ORAL_CAPSULE | Freq: Two times a day (BID) | ORAL | Status: DC
  Filled 2013-08-18: qty 28

## 2013-08-18 MED ORDER — LAMOTRIGINE 100 MG PO TABS
200.0000 mg | ORAL_TABLET | Freq: Every day | ORAL | Status: DC
  Filled 2013-08-18 (×2): qty 28

## 2013-08-18 MED ORDER — HYDROXYZINE HCL 25 MG PO TABS: 25.0000 mg | ORAL_TABLET | Freq: Two times a day (BID) | ORAL | Status: DC

## 2013-08-18 NOTE — Progress Notes (Signed)
[facility] MD Progress Note  08/18/2013 12:54 PM [name]  MRN:  [id number]  Subjective:  I like thorazine.  I still hear voices the night.  I still have suicidal thinking. still hear voices.  Sometime I feel they're telling me to do bad things.     Objective: Patient was seen chart reviewed.  The patient remains very depressed , having hallucination and suicidal thinking.  We had changed the medication yesterday.  She is taking Thorazine and her Haldol was discontinued.  She feels less anxious and less restless but remains paranoid and very guarded.  She has no tremors or shakes.  She is going to be discharged tomorrow morning however she still has hallucination, paranoia, suicidal thoughts and depression.  She denies any homicidal thoughts.  Her mood remains very irritable and she remains very guarded.  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 Hypertension   . Leg pain   . Carpal tunnel syndrome    Axis IV: economic problems, occupational problems, other psychosocial or environmental problems and problems with primary support group Axis V: 41-50 serious symptoms  ADL's:  Intact  Sleep: Fair  Appetite:  Fair  Suicidal Ideation: yes Denies plan or intent.  Homicidal Ideation:  Plan:  no  Intent:  no AEB (as evidenced by):  Psychiatric Specialty Exam: Physical Exam  Vitals reviewed.   Review of Systems  Constitutional: Negative.   Eyes: Negative.   Respiratory: Negative.   Cardiovascular: Negative.   Gastrointestinal: Negative.   Genitourinary: Negative.   Musculoskeletal: Negative.   Skin:  Negative.   Neurological: Negative.   Endo/Heme/Allergies: Negative.   Psychiatric/Behavioral: Positive for depression, suicidal ideas, hallucinations and substance abuse. Negative for memory loss. The patient is nervous/anxious. The patient does not have insomnia.     Blood pressure 114/78, pulse 93, temperature 98.3 F (36.8 C), temperature source Oral, resp. rate 16, height 5' 4.5" (1.638 m), weight 149 lb (67.586 kg), last menstrual period 07/22/2013.Body mass index is 25.19 kg/(m^2).  General Appearance: Casual  Eye Contact::  Fair  Speech:  Clear and Coherent  Volume:  Decreased  Mood:  Depressed  Affect:  Flat  Thought Process:  Goal Directed and Intact  Orientation:  Full (Time, Place, and Person)  Thought Content:  Hallucinations: Auditory and Rumination  Suicidal Thoughts:  No  Homicidal Thoughts:  No  Memory:  Recent;   Fair  Judgement:  Impaired  Insight:  Lacking  Psychomotor Activity:  Decreased  Concentration:  Fair  Recall:  Poor  Fund of Knowledge:Fair  Language: Fair  Akathisia:  Negative  Handed:  Right  AIMS (if indicated):     Assets:  Desire for Improvement Social Support  Sleep:  Number of Hours: 5.75   Musculoskeletal: Strength & Muscle Tone: within normal limits Gait & Station: normal Patient leans: N/A  Current Medications: Current Facility-Administered Medications  Medication Dose Route Frequency Provider Last Rate Last Dose  . acetaminophen (TYLENOL) tablet 650 mg  650 mg Oral Q6H PRN Lurena Nida, NP   650 mg at 08/12/13 8546  . alum & mag hydroxide-simeth (MAALOX/MYLANTA) 200-200-20 MG/5ML suspension 30 mL  30 mL Oral Q4H PRN Manus Gunning  Barrett Henle, NP      . chlorproMAZINE (THORAZINE) tablet 25 mg  25 mg Oral QHS Kathlee Nations, MD   25 mg at 08/17/13 2116  . gabapentin (NEURONTIN) capsule 100 mg  100 mg Oral BID Lurena Nida, NP   100 mg at 08/18/13 0825  . ibuprofen (ADVIL,MOTRIN) tablet 600 mg  600 mg Oral Q6H PRN Elmarie Shiley, NP   600 mg at  08/17/13 0755  . lamoTRIgine (LAMICTAL) tablet 225 mg  225 mg Oral Daily Lurena Nida, NP   225 mg at 08/18/13 0825  . lithium carbonate capsule 900 mg  900 mg Oral QHS Lurena Nida, NP   900 mg at 08/17/13 2116  . lubiprostone (AMITIZA) capsule 8 mcg  8 mcg Oral BID WC Lurena Nida, NP   8 mcg at 08/18/13 0825  . magnesium hydroxide (MILK OF MAGNESIA) suspension 30 mL  30 mL Oral Daily PRN Lurena Nida, NP   30 mL at 08/18/13 1101  . OLANZapine zydis (ZYPREXA) disintegrating tablet 5 mg  5 mg Oral Q8H PRN Elmarie Shiley, NP   5 mg at 08/18/13 0826  . sertraline (ZOLOFT) tablet 200 mg  200 mg Oral Daily Merian Capron, MD   200 mg at 08/18/13 0825  . traZODone (DESYREL) tablet 100 mg  100 mg Oral QHS PRN Mojeed Akintayo        Lab Results:  No results found for this or any previous visit (from the past 48 hour(s)).  Physical Findings: AIMS: Facial and Oral Movements Muscles of Facial Expression: None, normal Lips and Perioral Area: None, normal Jaw: None, normal Tongue: None, normal,Extremity Movements Upper (arms, wrists, hands, fingers): None, normal Lower (legs, knees, ankles, toes): None, normal, Trunk Movements Neck, shoulders, hips: None, normal, Overall Severity Severity of abnormal movements (highest score from questions above): None, normal Incapacitation due to abnormal movements: None, normal Patient's awareness of abnormal movements (rate only patient's report): No Awareness, Dental Status Current problems with teeth and/or dentures?: No Does patient usually wear dentures?: No  CIWA:  CIWA-Ar Total: 0 COWS:     Treatment Plan Summary: Daily contact with patient to assess and evaluate symptoms and progress in treatment Medication management  Plan: 1.Continue crisis management and stabilization.  Patient is not ready for discharge tomorrow morning. 2.Medication management: Continue Thorazine 25 mg twice a day.  Continue Neurontin 100 mg BID for anxiety, Lamictal 225 mg  daily for improved mood stability, Lithium 900 mg hs to reduce recurrent suicidal thoughts, Zoloft 200 mg Daily for depressive symptoms, Trazodone 100 mg hs prn for insomnia. 3.Encouraged patient to attend groups and participate in group counseling sessions and activities.  4.Discharge plan in progress.  Patient will be seen again tomorrow morning if she is ready for discharged on Tuesday. 5. Address health issues: Vitals reviewed and stable.   Medical Decision Making Problem Points:  Established problem, improving (1), Review of last therapy session (1) and Review of psycho-social stressors (1) Data Points:  Review of medication regiment & side effects (2) Review of new medications or change in dosage (2)  I certify that inpatient services furnished can reasonably be expected to improve the patient's condition.   Courtlynn Holloman T. 08/18/2013, 12:54 PM
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
## 2013-08-18 NOTE — Progress Notes (Signed)
     D. Patient presents with depressed,  affect congruent again today. In am patient reports sleeping well, and states '' the thorazine is really helping me I like that it doesn't make me feel like I'm in a fog now '' Pt reports appetite good, improving attention, normal energy. Pt reports depression as 5 (1-10 scale 10 being worst).Attending groups and participating. She denies any SI/HI. A. Medications given as ordered. Support and encouragement provided. R. Patient with no further voiced concerns. In no acute distress at this time. Will continue to monitor q 15 minutes for safety

## 2013-08-18 NOTE — Progress Notes (Signed)
Patient ID: [name], female   DOB: 1971/06/03, 43 y.o.   MRN: [id number] Psychoeducational Group Note  Date:  08/18/2013 Time:  0930am  Group Topic/Focus:  Making Healthy Choices:   The focus of this group is to help patients identify negative/unhealthy choices they were using prior to admission and identify positive/healthier coping strategies to replace them upon discharge.  Participation Level:  Active  Participation Quality:  Appropriate  Affect:  Appropriate  Cognitive:  Appropriate  Insight:  Supportive  Engagement in Group:  Supportive  Additional Comments:  Inventory and healthy support system   Pricilla Larsson 08/18/2013,10:54 AM

## 2013-08-18 NOTE — [facility] Group Notes (Signed)
[facility] Group Notes: (Clinical Social Work)   08/18/2013      Type of Therapy:  Group Therapy   Participation Level:  Did Not Attend    Selmer Dominion, LCSW 08/18/2013, 12:24 PM

## 2013-08-18 NOTE — Progress Notes (Signed)
Adult Psychoeducational Group Note  Date:  08/18/2013 Time:  10:03 PM  Group Topic/Focus:  Wrap-Up Group:   The focus of this group is to help patients review their daily goal of treatment and discuss progress on daily workbooks.  Participation Level:  Active  Participation Quality:  Appropriate  Affect:  Appropriate  Cognitive:  Appropriate  Insight: Improving  Engagement in Group:  Engaged  Modes of Intervention:  Support  Additional Comments:  Patient attended and participated in group tonight. She reported that she got anxious and upset today. She felt her needs were not being met. Her support system is the ACT team.  Salley Scarlet [facility] 08/18/2013, 10:03 PM
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
## 2013-08-18 NOTE — Discharge Instructions (Addendum)
Mood Disorders  Mood disorders are conditions that affect the way a person feels emotionally. The main mood disorders include:  · Depression.  · Bipolar disorder.  · Dysthymia. Dysthymia is a mild, lasting (chronic) depression. Symptoms of dysthymia are similar to depression, but not as severe.  · Cyclothymia. Cyclothymia includes mood swings, but the highs and lows are not as severe as they are in bipolar disorder. Symptoms of cyclothymia are similar to those of bipolar disorder, but less extreme.  CAUSES   Mood disorders are probably caused by a combination of factors. People with mood disorders seem to have physical and chemical changes in their brains. Mood disorders run in families, so there may be genetic causes. Severe trauma or stressful life events may also increase the risk of mood disorders.   SYMPTOMS   Symptoms of mood disorders depend on the specific type of condition.  Depression symptoms include:  · Feeling sad, worthless, or hopeless.  · Negative thoughts.  · Inability to enjoy one's usual activities.  · Low energy.  · Sleeping too much or too little.  · Appetite changes.  · Crying.  · Concentration problems.  · Thoughts of harming oneself.  Bipolar disorder symptoms include:  · Periods of depression (see above symptoms).  · Mood swings, from sadness and depression, to abnormal elation and excitement.  · Periods of mania:  · Racing thoughts.  · Fast speech.  · Poor judgment, and careless, dangerous choices.  · Decreased need for sleep.  · Risky behavior.  · Difficulty concentrating.  · Irritability.  · Increased energy.  · Increased sex drive.  DIAGNOSIS   There are no blood tests or X-rays that can confirm a mood disorder. However, your caregiver may choose to run some tests to make sure that there is not another physical cause for your symptoms. A mood disorder is usually diagnosed after an in-depth interview with a caregiver.  TREATMENT   Mood disorders can be treated with one or more of the  following:  · Medicine. This may include antidepressants, mood-stabilizers, or anti-psychotics.  · Psychotherapy (talk therapy).  · Cognitive behavioral therapy. You are taught to recognize negative thoughts and behavior patterns, and replace them with healthy thoughts and behaviors.  · Electroconvulsive therapy. For very severe cases of deep depression, a series of treatments in which an electrical current is applied to the brain.  · Vagus nerve stimulation. A pulse of electricity is applied to a portion of the brain.  · Transcranial magnetic stimulation. Powerful magnets are placed on the head that produce electrical currents.  · Hospitalization. In severe situations, or when someone is having serious thoughts of harming him or herself, hospitalization may be necessary in order to keep the person safe. This is also done to quickly start and monitor treatment.  HOME CARE INSTRUCTIONS   · Take your medicine exactly as directed.  · Attend all of your therapy sessions.  · Try to eat regular, healthy meals.  · Exercise daily. Exercise may improve mood symptoms.  · Get good sleep.  · Do not drink alcohol or use pot or other drugs. These can worsen mood symptoms and cause anxiety and psychosis.  · Tell your caregiver if you develop any side effects, such as feeling sick to your stomach (nauseous), dry mouth, dizziness, constipation, drowsiness, tremor, weight gain, or sexual symptoms. He or she may suggest things you can do to improve symptoms.  · Learn ways to cope with the stress of having a   chronic illness. This includes yoga, meditation, tai chi, or participating in a support group.  · Drink enough water to keep your urine clear or pale yellow. Eat a high-fiber diet. These habits may help you avoid constipation from your medicine.  SEEK IMMEDIATE MEDICAL CARE IF:  · Your mood worsens.  · You have thoughts of hurting yourself or others.  · You cannot care for yourself.  · You develop the sensation of hearing or seeing  something that is not actually present (auditory or visual hallucinations).  · You develop abnormal thoughts.  Document Released: 04/24/2009 Document Revised: 09/19/2011 Document Reviewed: 04/24/2009  [employer]® Patient Information ©2014 [employer].

## 2013-08-19 LAB — LITHIUM LEVEL: Lithium Lvl: 1.35 mEq/L (ref 0.80–1.40)

## 2013-08-19 MED ORDER — LITHIUM CARBONATE 300 MG PO CAPS
600.0000 mg | ORAL_CAPSULE | Freq: Every day | ORAL | Status: DC
  Filled 2013-08-19: qty 28

## 2013-08-19 MED ORDER — LITHIUM CARBONATE 600 MG PO CAPS: 600.0000 mg | ORAL_CAPSULE | Freq: Every day | ORAL | Status: DC

## 2013-08-19 NOTE — [facility] Suicide Risk Assessment (Signed)
   Demographic Factors:  Low socioeconomic status, Unemployed and female  Total Time spent with patient: 15 minutes  Psychiatric Specialty Exam: Physical Exam  Psychiatric: She has a normal mood and affect. Her speech is normal and behavior is normal. Judgment and thought content normal. Cognition and memory are normal.    Review of Systems  Constitutional: Negative.   HENT: Negative.   Eyes: Negative.   Respiratory: Negative.   Cardiovascular: Negative.   Gastrointestinal: Negative.   Genitourinary: Negative.   Musculoskeletal: Negative.   Skin: Negative.   Neurological: Negative.   Endo/Heme/Allergies: Negative.   Psychiatric/Behavioral: Negative.     Blood pressure 124/86, pulse 95, temperature 98.5 F (36.9 C), temperature source Oral, resp. rate 20, height 5' 4.5" (1.638 m), weight 67.586 kg (149 lb), last menstrual period 07/22/2013.Body mass index is 25.19 kg/(m^2).  General Appearance: Fairly Groomed  Engineer, water::  Fair  Speech:  Clear and Coherent  Volume:  Normal  Mood:  Euphoric  Affect:  Appropriate  Thought Process:  Goal Directed and Linear  Orientation:  Full (Time, Place, and Person)  Thought Content:  Negative  Suicidal Thoughts:  No  Homicidal Thoughts:  No  Memory:  Immediate;   Fair Recent;   Fair Remote;   Fair  Judgement:  Fair  Insight:  Fair  Psychomotor Activity:  Normal  Concentration:  Fair  Recall:  [employer] [place]  Language: Fair  Akathisia:  No  Handed:  Right  AIMS (if indicated):     Assets:  Communication Skills Desire for Improvement Physical Health Social Support  Sleep:  Number of Hours: 6.25    Musculoskeletal: Strength & Muscle Tone: within normal limits Gait & Station: normal Patient leans: N/A   Mental Status Per Nursing Assessment::   On Admission:  NA  Current Mental Status by Physician: patient denies suicidal ideation, intent or plan  Loss Factors: Financial problems/change in socioeconomic  status  Historical Factors: Impulsivity  Risk Reduction Factors:   Sense of responsibility to family, Living with another person, especially a relative and Positive social support  Continued Clinical Symptoms:  Alcohol/Substance Abuse/Dependencies  Cognitive Features That Contribute To Risk:  Closed-mindedness    Suicide Risk:  Minimal: No identifiable suicidal ideation.  Patients presenting with no risk factors but with morbid ruminations; may be classified as minimal risk based on the severity of the depressive symptoms  Discharge Diagnoses:   AXIS I:  Bipolar I disorder, most recent episode depressed              Alcohol dependence. History of Polysubstance dependence AXIS II:  Deferred AXIS III:   Past Medical History  Diagnosis Date  . Hypertension   . Leg pain   . Carpal tunnel syndrome    AXIS IV:  economic problems, other psychosocial or environmental problems and problems related to social environment AXIS V:  61-70 mild symptoms  Plan Of Care/Follow-up recommendations:  Activity:  as tolerated Diet:  healthy Tests:  Lithium level: Current level is 1.35. As a result Lithium Carbonate has been reduced to 600mg  po Daily at bedtime. Other:  patient is referred to [facility] for drug and Alcohol rehabilitation.  Is patient on multiple antipsychotic therapies at discharge:  No   Has Patient had three or more failed trials of antipsychotic monotherapy by history:  No  Recommended Plan for Multiple Antipsychotic Therapies: NA    Corena Pilgrim, MD 08/19/2013, 8:50 AM

## 2013-08-19 NOTE — Discharge Summary (Signed)
Physician Discharge Summary Note  Patient:  [name] is an 43 y.o., female MRN:  [id number] DOB:  Jul 29, 1970 Patient phone:  [phone] (home)  Patient address:   [place] [address],  Total Time spent with patient: 15 minutes  Date of Admission:  08/09/2013 Date of Discharge: 08/19/12  Reason for Admission:  Depression with SI, substance abuse   Discharge Diagnoses: Principal Problem:   Bipolar I disorder, most recent episode depressed Active Problems:   Major depressive disorder with psychotic features   Psychiatric Specialty Exam: Physical Exam  Review of Systems  Constitutional: Negative.   HENT: Negative.   Eyes: Negative.   Respiratory: Negative.   Cardiovascular: Negative.   Gastrointestinal: Negative.   Genitourinary: Negative.   Musculoskeletal: Negative.   Skin: Negative.   Neurological: Negative.   Endo/Heme/Allergies: Negative.   Psychiatric/Behavioral: Negative for depression, suicidal ideas, hallucinations, memory loss and substance abuse. The patient is not nervous/anxious and does not have insomnia.     Blood pressure 124/86, pulse 95, temperature 98.5 F (36.9 C), temperature source Oral, resp. rate 20, height 5' 4.5" (1.638 m), weight 67.586 kg (149 lb), last menstrual period 07/22/2013.Body mass index is 25.19 kg/(m^2).  General Appearance: Fairly Groomed  Engineer, water::  Fair  Speech:  Clear and Coherent  Volume:  Normal  Mood:  Euphoric  Affect:  Appropriate  Thought Process:  Goal Directed and Linear  Orientation:  Full (Time, Place, and Person)  Thought Content:  Negative  Suicidal Thoughts:  No  Homicidal Thoughts:  No  Memory:  Immediate;   Fair Recent;   Fair Remote;   Fair  Judgement:  Fair  Insight:  Fair  Psychomotor Activity:  Normal  Concentration:  Fair  Recall:  [employer] of Knowledge:Fair  Language: Fair  Akathisia:  No  Handed:  Right  AIMS (if indicated):     Assets:  Communication Skills Desire  for Improvement Physical Health Social Support  Sleep:  Number of Hours: 6.25    Past Psychiatric History:Yes  Diagnosis: MMD with psychosis, polysubstance abuse   Hospitalizations: [facility] 02/2013, [facility]   Outpatient Care: [facility]   Substance Abuse Care: Denies   Self-Mutilation: Denies   Suicidal Attempts: previous OD with pills and alcohol   Violent Behaviors: Denies    Musculoskeletal: Strength & Muscle Tone: within normal limits Gait & Station: normal Patient leans: N/A  DSM5: AXIS I: Bipolar I disorder, most recent episode depressed  Alcohol dependence. History of Polysubstance dependence  AXIS II: Deferred  AXIS III:  Past Medical History   Diagnosis  Date   .  Hypertension    .  Leg pain    .  Carpal tunnel syndrome    AXIS IV: economic problems, other psychosocial or environmental problems and problems related to social environment  AXIS V: 61-70 mild symptoms  Level of Care:  OP  Hospital Course: [name] is a 43 year old female who was  admitted from [facility] with IVC as she presented with depressive symptoms, paranoia and hallucinations. Reported on admission that  she has had detox off alcohol and was waiting for a bed at [facility] for 30 days recovery. She has been detoxed but they were worried about her depression. Endorses feeling down, impulsive and withdrawn. Endorses hearing voices but not commanding.   Patient was admitted to the 400 hall for further assessment and medication management. Her Zoloft was increased to 200 mg daily to address depressive symptoms. Her antipsychotic was changed from  Seroquel to Haldol due to patient report that she continued to hear voices with no improvement. Patient's Lithium was continued for improved mood stability and to reduce recurrence of suicidal thoughts. Patient continued to reports symptoms of irritable mood, voices telling her to harm herself, paranoia, and depression. Patient reported feeling more hopeful after finding  out that she was accepted to [facility] to obtain help for her substance abuse. Patient admitted that she was trying to kill herself by using substances recklessly. She was motivated to live for her eight children. Over the weekend prior to her discharge the  the patient reported to the Psychiatrist that the Haldol was not helping. This prompted the patient to be switched to Thorazine to address her psychotic symptoms with positive effect. On the day of discharge her Lithium dosage was decreased to 600 mg as her level was noted to be 1.35. Patient felt that going directly to treatment would decrease the chance of relapse. Patient safely left [facility] for further treatment at [place] with fourteen day supply of medications and prescriptions.   Consults:  None  Significant Diagnostic Studies:  Chem profile, UA, CBC, Lithium level, UDS  Discharge Vitals:   Blood pressure 124/86, pulse 95, temperature 98.5 F (36.9 C), temperature source Oral, resp. rate 20, height 5' 4.5" (1.638 m), weight 67.586 kg (149 lb), last menstrual period 07/22/2013. Body mass index is 25.19 kg/(m^2). Lab Results:   Results for orders placed during the hospital encounter of 08/09/13 (from the past 72 hour(s))  LITHIUM LEVEL     Status: None   Collection Time    08/19/13  6:22 AM      Result Value Range   Lithium Lvl 1.35  0.80 - 1.40 mEq/L   Comment: Performed at [facility]    Physical Findings: AIMS: Facial and Oral Movements Muscles of Facial Expression: None, normal Lips and Perioral Area: None, normal Jaw: None, normal Tongue: None, normal,Extremity Movements Upper (arms, wrists, hands, fingers): None, normal Lower (legs, knees, ankles, toes): None, normal, Trunk Movements Neck, shoulders, hips: None, normal, Overall Severity Severity of abnormal movements (highest score from questions above): None, normal Incapacitation due to abnormal movements: None, normal Patient's awareness of abnormal  movements (rate only patient's report): No Awareness, Dental Status Current problems with teeth and/or dentures?: No Does patient usually wear dentures?: No  CIWA:  CIWA-Ar Total: 0 COWS:     Psychiatric Specialty Exam: See Psychiatric Specialty Exam and Suicide Risk Assessment completed by Attending Physician prior to discharge.  Discharge destination:  [facility]  Is patient on multiple antipsychotic therapies at discharge:  No   Has Patient had three or more failed trials of antipsychotic monotherapy by history:  No  Recommended Plan for Multiple Antipsychotic Therapies: NA  Discharge Orders   Future Orders Complete By Expires   Activity as tolerated - No restrictions  As directed    Diet - low sodium heart healthy  As directed        Medication List    STOP taking these medications       acetaminophen 500 MG tablet  Commonly known as:  TYLENOL     alum & mag hydroxide-simeth 976-734-19 MG/5ML suspension  Commonly known as:  MAALOX/MYLANTA     bismuth subsalicylate 379 KW/40XB suspension  Commonly known as:  PEPTO BISMOL     cloNIDine 0.1 MG tablet  Commonly known as:  CATAPRES     dextromethorphan-guaiFENesin 10-100 MG/5ML liquid  Commonly known as:  ROBITUSSIN-DM  dicyclomine 10 MG capsule  Commonly known as:  BENTYL     loratadine 10 MG tablet  Commonly known as:  CLARITIN     magnesium hydroxide 400 MG/5ML suspension  Commonly known as:  MILK OF MAGNESIA     naltrexone 50 MG tablet  Commonly known as:  DEPADE     QUEtiapine 100 MG tablet  Commonly known as:  SEROQUEL      TAKE these medications     Indication   chlorproMAZINE 25 MG tablet  Commonly known as:  THORAZINE  Take 1 tablet (25 mg total) by mouth at bedtime.   Indication:  Psychosis     docusate sodium 100 MG capsule  Commonly known as:  COLACE  Take 100 mg by mouth 2 (two) times daily as needed for mild constipation.      gabapentin 100 MG capsule  Commonly known as:   NEURONTIN  Take 1 capsule (100 mg total) by mouth 2 (two) times daily.   Indication:  Neuropathic Pain     hydrOXYzine 25 MG tablet  Commonly known as:  ATARAX/VISTARIL  Take 1 tablet (25 mg total) by mouth 2 (two) times daily.   Indication:  Anxiety Neurosis     ibuprofen 800 MG tablet  Commonly known as:  ADVIL,MOTRIN  Take 800 mg by mouth 3 (three) times daily as needed for moderate pain.      lamoTRIgine 25 MG tablet  Commonly known as:  LAMICTAL  Take 9 tablets (225 mg total) by mouth daily.   Indication:  Manic-Depression     lithium carbonate 300 MG capsule  Take 3 capsules (900 mg total) by mouth at bedtime.   Indication:  Manic-Depression     lithium 600 MG capsule  Take 1 capsule (600 mg total) by mouth at bedtime.   Indication:  Manic-Depression     lubiprostone 8 MCG capsule  Commonly known as:  AMITIZA  Take 1 capsule (8 mcg total) by mouth 2 (two) times daily with a meal.   Indication:  Chronic Constipation of Unknown Cause     ranitidine 150 MG tablet  Commonly known as:  ZANTAC  Take 1 tablet (150 mg total) by mouth as needed for heartburn (no more than 2 tabs per 24 hours).   Indication:  Gastroesophageal Reflux Disease     sertraline 100 MG tablet  Commonly known as:  ZOLOFT  Take 2 tablets (200 mg total) by mouth daily.   Indication:  Major Depressive Disorder     traZODone 100 MG tablet  Commonly known as:  DESYREL  Take 1 tablet (100 mg total) by mouth at bedtime as needed for sleep.   Indication:  Trouble Sleeping           Follow-up Information   Follow up with [facility] . (Arrive at [place] sharp for your screening for possible admission appointment)    Contact information:   [place] [phone]      Follow up with [facility]. (Go to the walk-in clinic M-F between 8 and 9AM for your hospital follow up appointment)    Contact information:   [place] [phone]      Follow-up  recommendations:   Activity: as tolerated  Diet: healthy  Tests: Lithium level: Current level is 1.35. As a result Lithium Carbonate has been reduced to 600mg  po Daily at bedtime.  Other: patient is referred to [facility] for drug  and Alcohol rehabilitation.   Comments:   Take all your medications as prescribed by your mental healthcare provider.  Report any adverse effects and or reactions from your medicines to your outpatient provider promptly.  Patient is instructed and cautioned to not engage in alcohol and or illegal drug use while on prescription medicines.  In the event of worsening symptoms, patient is instructed to call the crisis hotline, 911 and or go to the nearest ED for appropriate evaluation and treatment of symptoms.  Follow-up with your primary care provider for your other medical issues, concerns and or health care needs.   Total Discharge Time:  Greater than 30 minutes.  SignedElmarie Shiley NP-C 08/19/2013, 8:57 AM  Patient seen, evaluated and I agree with notes by Nurse Practitioner. Corena Pilgrim, MD

## 2013-08-19 NOTE — Progress Notes (Signed)
Patient ID: [name], female   DOB: 1971-05-12, 43 y.o.   MRN: [id number] Nursing discharge note: Patient discharged home per MD order.  Patient received all personal belongings, prescriptions and medication samples.  She denies suicidal/homicidal/auditory visual hallucinations.  Patient had called [facility] and was told that she had a bed waiting for her.  Patient left ambulatory with her daughter and plans to go straight to [facility].

## 2013-08-22 NOTE — Progress Notes (Signed)
Patient Discharge Instructions:  After Visit Summary (AVS):   Faxed to:  08/22/13 Discharge Summary Note:   Faxed to:  08/22/13 Psychiatric Admission Assessment Note:   Faxed to:  08/22/13 Suicide Risk Assessment - Discharge Assessment:   Faxed to:  08/22/13 Faxed/Sent to the Next Level Care provider:  08/22/13 Faxed to [facility] @ [phone] Faxed to [facility] @ [place], 08/22/2013, 3:10 PM

## 2013-11-29 ENCOUNTER — Inpatient Hospital Stay (HOSPITAL_COMMUNITY)
Admission: AD | Admit: 2013-11-29 | Discharge: 2013-12-11 | DRG: 885 | Disposition: A | Discharge status: Home / Self Care | Payer: Medicaid Other | Source: Intra-hospital | Attending: Psychiatry | Admitting: Psychiatry

## 2013-11-29 ENCOUNTER — Emergency Department (HOSPITAL_COMMUNITY)
Admission: EM | Admit: 2013-11-29 | Discharge: 2013-11-29 | Disposition: A | Discharge status: Other Acute Inpatient Hospital | Payer: Medicaid Other | Attending: Emergency Medicine | Admitting: Emergency Medicine

## 2013-11-29 ENCOUNTER — Encounter (HOSPITAL_COMMUNITY): Payer: Self-pay | Admitting: Emergency Medicine

## 2013-11-29 ENCOUNTER — Encounter (HOSPITAL_COMMUNITY): Payer: Self-pay | Admitting: *Deleted

## 2013-11-29 DIAGNOSIS — R000 Tachycardia, unspecified: Secondary | ICD-10-CM | POA: Insufficient documentation

## 2013-11-29 DIAGNOSIS — Z3202 Encounter for pregnancy test, result negative: Secondary | ICD-10-CM | POA: Insufficient documentation

## 2013-11-29 DIAGNOSIS — R45851 Suicidal ideations: Secondary | ICD-10-CM

## 2013-11-29 DIAGNOSIS — Z91199 Patient's noncompliance with other medical treatment and regimen due to unspecified reason: Secondary | ICD-10-CM

## 2013-11-29 DIAGNOSIS — F1920 Other psychoactive substance dependence, uncomplicated: Secondary | ICD-10-CM | POA: Diagnosis present

## 2013-11-29 DIAGNOSIS — Z79899 Other long term (current) drug therapy: Secondary | ICD-10-CM | POA: Insufficient documentation

## 2013-11-29 DIAGNOSIS — F4310 Post-traumatic stress disorder, unspecified: Secondary | ICD-10-CM | POA: Diagnosis present

## 2013-11-29 DIAGNOSIS — F29 Unspecified psychosis not due to a substance or known physiological condition: Secondary | ICD-10-CM | POA: Insufficient documentation

## 2013-11-29 DIAGNOSIS — F911 Conduct disorder, childhood-onset type: Secondary | ICD-10-CM | POA: Insufficient documentation

## 2013-11-29 DIAGNOSIS — Z87891 Personal history of nicotine dependence: Secondary | ICD-10-CM | POA: Insufficient documentation

## 2013-11-29 DIAGNOSIS — Z801 Family history of malignant neoplasm of trachea, bronchus and lung: Secondary | ICD-10-CM

## 2013-11-29 DIAGNOSIS — F3130 Bipolar disorder, current episode depressed, mild or moderate severity, unspecified: Principal | ICD-10-CM | POA: Diagnosis present

## 2013-11-29 DIAGNOSIS — Z5987 Material hardship due to limited financial resources, not elsewhere classified: Secondary | ICD-10-CM

## 2013-11-29 DIAGNOSIS — Z9119 Patient's noncompliance with other medical treatment and regimen: Secondary | ICD-10-CM

## 2013-11-29 DIAGNOSIS — Z91040 Latex allergy status: Secondary | ICD-10-CM | POA: Insufficient documentation

## 2013-11-29 DIAGNOSIS — F1020 Alcohol dependence, uncomplicated: Secondary | ICD-10-CM | POA: Diagnosis present

## 2013-11-29 DIAGNOSIS — F410 Panic disorder [episodic paroxysmal anxiety] without agoraphobia: Secondary | ICD-10-CM | POA: Diagnosis present

## 2013-11-29 DIAGNOSIS — Z800 Family history of malignant neoplasm of digestive organs: Secondary | ICD-10-CM

## 2013-11-29 DIAGNOSIS — F411 Generalized anxiety disorder: Secondary | ICD-10-CM | POA: Diagnosis present

## 2013-11-29 DIAGNOSIS — G4700 Insomnia, unspecified: Secondary | ICD-10-CM | POA: Diagnosis present

## 2013-11-29 DIAGNOSIS — Z598 Other problems related to housing and economic circumstances: Secondary | ICD-10-CM

## 2013-11-29 DIAGNOSIS — F319 Bipolar disorder, unspecified: Secondary | ICD-10-CM | POA: Insufficient documentation

## 2013-11-29 DIAGNOSIS — I10 Essential (primary) hypertension: Secondary | ICD-10-CM | POA: Insufficient documentation

## 2013-11-29 DIAGNOSIS — Z833 Family history of diabetes mellitus: Secondary | ICD-10-CM

## 2013-11-29 DIAGNOSIS — Z8669 Personal history of other diseases of the nervous system and sense organs: Secondary | ICD-10-CM | POA: Insufficient documentation

## 2013-11-29 LAB — COMPREHENSIVE METABOLIC PANEL
ALBUMIN: 3.6 g/dL (ref 3.5–5.2)
ALT: 14 U/L (ref 0–35)
AST: 31 U/L (ref 0–37)
Alkaline Phosphatase: 49 U/L (ref 39–117)
BILIRUBIN TOTAL: 0.6 mg/dL (ref 0.3–1.2)
BUN: 8 mg/dL (ref 6–23)
CHLORIDE: 97 meq/L (ref 96–112)
CO2: 21 meq/L (ref 19–32)
CREATININE: 0.88 mg/dL (ref 0.50–1.10)
Calcium: 9.2 mg/dL (ref 8.4–10.5)
GFR calc Af Amer: >90 mL/min (ref >90)
GFR calc non Af Amer: 79 mL/min — ABNORMAL LOW (ref >90)
Glucose, Bld: 154 mg/dL — ABNORMAL HIGH (ref 70–99)
Potassium: 3 mEq/L — ABNORMAL LOW (ref 3.7–5.3)
SODIUM: 140 meq/L (ref 137–147)
Total Protein: 8.1 g/dL (ref 6.0–8.3)

## 2013-11-29 LAB — CBC WITH DIFFERENTIAL/PLATELET
Basophils Absolute: 0.1 10*3/uL (ref 0.0–0.1)
Basophils Relative: 1 % (ref 0–1)
Eosinophils Absolute: 0.2 10*3/uL (ref 0.0–0.7)
Eosinophils Relative: 2 % (ref 0–5)
HEMATOCRIT: 34.8 % — AB (ref 36.0–46.0)
HEMOGLOBIN: 12 g/dL (ref 12.0–15.0)
LYMPHS PCT: 55 % — AB (ref 12–46)
Lymphs Abs: 4.5 10*3/uL — ABNORMAL HIGH (ref 0.7–4.0)
MCH: 27.5 pg (ref 26.0–34.0)
MCHC: 34.5 g/dL (ref 30.0–36.0)
MCV: 79.8 fL (ref 78.0–100.0)
MONO ABS: 0.4 10*3/uL (ref 0.1–1.0)
Monocytes Relative: 5 % (ref 3–12)
NEUTROS ABS: 2.9 10*3/uL (ref 1.7–7.7)
Neutrophils Relative %: 37 % — ABNORMAL LOW (ref 43–77)
Platelets: 358 10*3/uL (ref 150–400)
RBC: 4.36 MIL/uL (ref 3.87–5.11)
RDW: 13.2 % (ref 11.5–15.5)
WBC: 8 10*3/uL (ref 4.0–10.5)

## 2013-11-29 LAB — URINE MICROSCOPIC-ADD ON

## 2013-11-29 LAB — URINALYSIS, ROUTINE W REFLEX MICROSCOPIC
BILIRUBIN URINE: NEGATIVE
Glucose, UA: NEGATIVE mg/dL
Ketones, ur: NEGATIVE mg/dL
Leukocytes, UA: NEGATIVE
NITRITE: NEGATIVE
Protein, ur: NEGATIVE mg/dL
SPECIFIC GRAVITY, URINE: 1.005 (ref 1.005–1.030)
UROBILINOGEN UA: 0.2 mg/dL (ref 0.0–1.0)
pH: 6 (ref 5.0–8.0)

## 2013-11-29 LAB — RAPID URINE DRUG SCREEN, HOSP PERFORMED
Amphetamines: NOT DETECTED
BARBITURATES: NOT DETECTED
Benzodiazepines: NOT DETECTED
COCAINE: NOT DETECTED
Opiates: NOT DETECTED
TETRAHYDROCANNABINOL: NOT DETECTED

## 2013-11-29 LAB — ACETAMINOPHEN LEVEL: Acetaminophen (Tylenol), Serum: <15 ug/mL (ref 10–30)

## 2013-11-29 LAB — SALICYLATE LEVEL

## 2013-11-29 LAB — ETHANOL: Alcohol, Ethyl (B): 111 mg/dL — ABNORMAL HIGH (ref 0–11)

## 2013-11-29 LAB — LITHIUM LEVEL: Lithium Lvl: <0.25 mEq/L — ABNORMAL LOW (ref 0.80–1.40)

## 2013-11-29 LAB — POC URINE PREG, ED: Preg Test, Ur: NEGATIVE

## 2013-11-29 MED ORDER — HYDROXYZINE HCL 50 MG PO TABS
50.0000 mg | ORAL_TABLET | Freq: Three times a day (TID) | ORAL | Status: DC | PRN
  Administered 2013-11-29 – 2013-12-04 (×6): 50 mg via ORAL
  Filled 2013-11-29 (×6): qty 1

## 2013-11-29 MED ORDER — ZIPRASIDONE MESYLATE 20 MG IM SOLR
INTRAMUSCULAR | Status: AC
  Administered 2013-11-29: 20 mg via INTRAMUSCULAR
  Filled 2013-11-29: qty 20

## 2013-11-29 MED ORDER — SERTRALINE HCL 50 MG PO TABS: 200.0000 mg | ORAL_TABLET | Freq: Every day | ORAL | Status: DC

## 2013-11-29 MED ORDER — LITHIUM CARBONATE 300 MG PO CAPS
600.0000 mg | ORAL_CAPSULE | Freq: Every day | ORAL | Status: DC
  Administered 2013-11-29 – 2013-12-10 (×12): 600 mg via ORAL
  Filled 2013-11-29: qty 6
  Filled 2013-11-29 (×12): qty 2
  Filled 2013-11-29: qty 6
  Filled 2013-11-29 (×2): qty 2

## 2013-11-29 MED ORDER — QUETIAPINE FUMARATE 300 MG PO TABS: 300.0000 mg | ORAL_TABLET | Freq: Every day | ORAL | Status: DC

## 2013-11-29 MED ORDER — LAMOTRIGINE 25 MG PO TABS: 25.0000 mg | ORAL_TABLET | Freq: Every day | ORAL | Status: DC

## 2013-11-29 MED ORDER — GABAPENTIN 100 MG PO CAPS
100.0000 mg | ORAL_CAPSULE | Freq: Two times a day (BID) | ORAL | Status: DC
  Administered 2013-11-29: 100 mg via ORAL
  Filled 2013-11-29: qty 1

## 2013-11-29 MED ORDER — LITHIUM CARBONATE 300 MG PO CAPS: 600.0000 mg | ORAL_CAPSULE | Freq: Every day | ORAL | Status: DC

## 2013-11-29 MED ORDER — ALUM & MAG HYDROXIDE-SIMETH 200-200-20 MG/5ML PO SUSP: 30.0000 mL | ORAL | Status: DC | PRN

## 2013-11-29 MED ORDER — LAMOTRIGINE 200 MG PO TABS
225.0000 mg | ORAL_TABLET | Freq: Every day | ORAL | Status: DC
  Administered 2013-11-29: 225 mg via ORAL
  Filled 2013-11-29: qty 1

## 2013-11-29 MED ORDER — SERTRALINE HCL 100 MG PO TABS
200.0000 mg | ORAL_TABLET | Freq: Every day | ORAL | Status: DC
  Administered 2013-11-30: 200 mg via ORAL
  Filled 2013-11-29 (×3): qty 2

## 2013-11-29 MED ORDER — POTASSIUM CHLORIDE CRYS ER 20 MEQ PO TBCR
40.0000 meq | EXTENDED_RELEASE_TABLET | Freq: Two times a day (BID) | ORAL | Status: AC
  Administered 2013-11-29 – 2013-12-01 (×4): 40 meq via ORAL
  Filled 2013-11-29 (×5): qty 2

## 2013-11-29 MED ORDER — ZIPRASIDONE MESYLATE 20 MG IM SOLR
20.0000 mg | Freq: Once | INTRAMUSCULAR | Status: AC
  Administered 2013-11-29: 20 mg via INTRAMUSCULAR

## 2013-11-29 MED ORDER — ACETAMINOPHEN 325 MG PO TABS: 650.0000 mg | ORAL_TABLET | Freq: Four times a day (QID) | ORAL | Status: DC | PRN

## 2013-11-29 MED ORDER — POTASSIUM CHLORIDE CRYS ER 20 MEQ PO TBCR
40.0000 meq | EXTENDED_RELEASE_TABLET | Freq: Two times a day (BID) | ORAL | Status: DC
  Administered 2013-11-29: 40 meq via ORAL
  Filled 2013-11-29: qty 2

## 2013-11-29 MED ORDER — GABAPENTIN 100 MG PO CAPS
100.0000 mg | ORAL_CAPSULE | Freq: Two times a day (BID) | ORAL | Status: DC
  Administered 2013-11-30 – 2013-12-09 (×19): 100 mg via ORAL
  Filled 2013-11-29 (×23): qty 1

## 2013-11-29 MED ORDER — MAGNESIUM HYDROXIDE 400 MG/5ML PO SUSP
30.0000 mL | Freq: Every day | ORAL | Status: DC | PRN
  Administered 2013-12-02 – 2013-12-11 (×3): 30 mL via ORAL

## 2013-11-29 MED ORDER — SERTRALINE HCL 50 MG PO TABS
200.0000 mg | ORAL_TABLET | Freq: Every day | ORAL | Status: DC
  Administered 2013-11-29: 200 mg via ORAL
  Filled 2013-11-29: qty 4

## 2013-11-29 MED ORDER — TRAZODONE HCL 100 MG PO TABS: 100.0000 mg | ORAL_TABLET | Freq: Every evening | ORAL | Status: DC | PRN

## 2013-11-29 MED ORDER — LAMOTRIGINE 25 MG PO TABS: 225.0000 mg | ORAL_TABLET | Freq: Every day | ORAL | Status: DC

## 2013-11-29 MED ORDER — TRAZODONE HCL 100 MG PO TABS
100.0000 mg | ORAL_TABLET | Freq: Every evening | ORAL | Status: DC | PRN
  Administered 2013-11-29 – 2013-12-10 (×10): 100 mg via ORAL
  Filled 2013-11-29: qty 1
  Filled 2013-11-29: qty 3
  Filled 2013-11-29 (×7): qty 1

## 2013-11-29 MED ORDER — QUETIAPINE FUMARATE 300 MG PO TABS
300.0000 mg | ORAL_TABLET | Freq: Every day | ORAL | Status: DC
  Administered 2013-11-29 – 2013-12-05 (×7): 300 mg via ORAL
  Filled 2013-11-29 (×10): qty 1

## 2013-11-29 MED ORDER — LAMOTRIGINE 200 MG PO TABS: 200.0000 mg | ORAL_TABLET | Freq: Every day | ORAL | Status: DC

## 2013-11-29 MED ORDER — LAMOTRIGINE 25 MG PO TABS
225.0000 mg | ORAL_TABLET | Freq: Every day | ORAL | Status: DC
  Administered 2013-11-30 – 2013-12-11 (×12): 225 mg via ORAL
  Filled 2013-11-29 (×13): qty 1

## 2013-11-29 MED ORDER — HYDROXYZINE HCL 25 MG PO TABS
50.0000 mg | ORAL_TABLET | Freq: Three times a day (TID) | ORAL | Status: DC | PRN
  Administered 2013-11-29: 50 mg via ORAL
  Filled 2013-11-29: qty 2

## 2013-11-29 NOTE — ED Notes (Signed)
Patient is alert and oriented x3.  Patient was brought in by [facility] for making suicidal comments to police.   Patient is making verbally assaults towards staff and had to be restrained chemically and physically. Patient made statement that "when she gets to the end of her rope that she is going to end it all"

## 2013-11-29 NOTE — Tx Team (Signed)
Initial Interdisciplinary Treatment Plan  PATIENT STRENGTHS: (choose at least two) Ability for insight Average or above average intelligence Capable of independent living General fund of knowledge  PATIENT STRESSORS: Medication change or noncompliance Substance abuse   PROBLEM LIST: Problem List/Patient Goals Date to be addressed Date deferred Reason deferred Estimated date of resolution  Anxiety 11/29/13     Substance Abuse 11/29/13                                                DISCHARGE CRITERIA:  Ability to meet basic life and health needs Improved stabilization in mood, thinking, and/or behavior Verbal commitment to aftercare and medication compliance  PRELIMINARY DISCHARGE PLAN: Attend aftercare/continuing care group Return to previous living arrangement  PATIENT/FAMIILY INVOLVEMENT: This treatment plan has been presented to and reviewed with the patient, [name], and/or family member, .  The patient and family have been given the opportunity to ask questions and make suggestions.  [name] 11/29/2013, 7:36 PM

## 2013-11-29 NOTE — Progress Notes (Signed)
Pt presents very nervous and anxious. She stated that ,"once I am on my medications I am okay,." pt requested to be on 300mg  of seroquel at bedtime. She admitted to feeling stressed and stated intermittently she does hear voices but not at this time. Pt does contract for safety and denies SI or HI.Presenlty she is in the dayroom with the other pts.

## 2013-11-29 NOTE — ED Notes (Signed)
Patient utilizes [facility] which is a division of [facility] for some of her medications, [facility] [phone].

## 2013-11-29 NOTE — ED Notes (Signed)
Report called to RN at [facility] 

## 2013-11-29 NOTE — Consult Note (Signed)
[facility] Face-to-Face Psychiatry Consult   Reason for Consult:  Depression, alcohol dependency Referring Physician:  EDP  [name] is an 43 y.o. female. Total Time spent with patient: 20 minutes  Assessment: AXIS I:  Alcohol Abuse and Bipolar, Depressed AXIS II:  Deferred AXIS III:   Past Medical History  Diagnosis Date  . Hypertension   . Leg pain   . Substance abuse   . Bipolar 1 disorder   . Depression   . Psychosis   . Carpal tunnel syndrome    AXIS IV:  other psychosocial or environmental problems, problems related to social environment and problems with primary support group AXIS V:  21-30 behavior considerably influenced by delusions or hallucinations OR serious impairment in judgment, communication OR inability to function in almost all areas  Plan:  Recommend psychiatric Inpatient admission when medically cleared. Dr. Dwyane Dee assessed the patient and concurs with the plan.  Subjective:   [name] is a 43 y.o. female patient admitted with depression, suicidal ideations, and alcohol dependency/detox.  HPI:  Patient was brought to the emergency department by police after verbalizing a death with with intent to harm self.  Patient states that she feels "tired of living" and stopped taking her medications.  Patient endorses increased use of alcohol and states that she lacks the resources at the current time to stay sober.  Verbalizes racing thoughts and auditory hallucinations that encourage her to end it all.  Patient is tearful during interview, verbalizing hopelessness and increased stress.  HPI Elements:   Location:  generalized . Quality:  acute. Severity:  severe. Timing:  constant. Duration:  past few weeks. Context:  stressors.  Past Psychiatric History: Past Medical History  Diagnosis Date  . Hypertension   . Leg pain   . Substance abuse   . Bipolar 1 disorder   . Depression   . Psychosis   . Carpal tunnel syndrome     reports that she quit  smoking about 17 years ago. She has never used smokeless tobacco. She reports that she drinks about 7.2 ounces of alcohol per week. She reports that she does not use illicit drugs. Family History  Problem Relation Age of Onset  . Lung cancer Mother   . Colon cancer Father   . Diabetes Father   . Cancer Maternal Grandmother   . Cancer Maternal Grandfather   . Cancer Paternal Grandmother   . Prostate cancer Father   . Colon polyps Mother            Allergies:   Allergies  Allergen Reactions  . Latex Rash    ACT Assessment Complete:  Yes:    Educational Status    Risk to Self: Risk to self Is patient at risk for suicide?: Yes  Risk to Others:    Abuse:    Prior Inpatient Therapy:    Prior Outpatient Therapy:    Additional Information:                    Objective: Blood pressure 137/89, pulse 74, temperature 98.5 F (36.9 C), temperature source Oral, resp. rate 18, SpO2 100.00%.There is no weight on file to calculate BMI. Results for orders placed during the hospital encounter of 11/29/13 (from the past 72 hour(s))  CBC WITH DIFFERENTIAL     Status: Abnormal   Collection Time    11/29/13  1:34 AM      Result Value Ref Range   WBC 8.0  4.0 - 10.5 K/uL  RBC 4.36  3.87 - 5.11 MIL/uL   Hemoglobin 12.0  12.0 - 15.0 g/dL   HCT 34.8 (*) 36.0 - 46.0 %   MCV 79.8  78.0 - 100.0 fL   MCH 27.5  26.0 - 34.0 pg   MCHC 34.5  30.0 - 36.0 g/dL   RDW 13.2  11.5 - 15.5 %   Platelets 358  150 - 400 K/uL   Neutrophils Relative % 37 (*) 43 - 77 %   Neutro Abs 2.9  1.7 - 7.7 K/uL   Lymphocytes Relative 55 (*) 12 - 46 %   Lymphs Abs 4.5 (*) 0.7 - 4.0 K/uL   Monocytes Relative 5  3 - 12 %   Monocytes Absolute 0.4  0.1 - 1.0 K/uL   Eosinophils Relative 2  0 - 5 %   Eosinophils Absolute 0.2  0.0 - 0.7 K/uL   Basophils Relative 1  0 - 1 %   Basophils Absolute 0.1  0.0 - 0.1 K/uL  COMPREHENSIVE METABOLIC PANEL     Status: Abnormal   Collection Time    11/29/13  1:34 AM       Result Value Ref Range   Sodium 140  137 - 147 mEq/L   Potassium 3.0 (*) 3.7 - 5.3 mEq/L   Chloride 97  96 - 112 mEq/L   CO2 21  19 - 32 mEq/L   Glucose, Bld 154 (*) 70 - 99 mg/dL   BUN 8  6 - 23 mg/dL   Creatinine, Ser 0.88  0.50 - 1.10 mg/dL   Calcium 9.2  8.4 - 10.5 mg/dL   Total Protein 8.1  6.0 - 8.3 g/dL   Albumin 3.6  3.5 - 5.2 g/dL   AST 31  0 - 37 U/L   ALT 14  0 - 35 U/L   Alkaline Phosphatase 49  39 - 117 U/L   Total Bilirubin 0.6  0.3 - 1.2 mg/dL   GFR calc non Af Amer 79 (*) >90 mL/min   GFR calc Af Amer >90  >90 mL/min   Comment: (NOTE)     The eGFR has been calculated using the CKD EPI equation.     This calculation has not been validated in all clinical situations.     eGFR's persistently <90 mL/min signify possible Chronic Kidney     Disease.  ETHANOL     Status: Abnormal   Collection Time    11/29/13  1:34 AM      Result Value Ref Range   Alcohol, Ethyl (B) 111 (*) 0 - 11 mg/dL   Comment:            LOWEST DETECTABLE LIMIT FOR     SERUM ALCOHOL IS 11 mg/dL     FOR MEDICAL PURPOSES ONLY  ACETAMINOPHEN LEVEL     Status: None   Collection Time    11/29/13  1:34 AM      Result Value Ref Range   Acetaminophen (Tylenol), Serum <15.0  10 - 30 ug/mL   Comment:            THERAPEUTIC CONCENTRATIONS VARY     SIGNIFICANTLY. A RANGE OF 10-30     ug/mL MAY BE AN EFFECTIVE     CONCENTRATION FOR MANY PATIENTS.     HOWEVER, SOME ARE BEST TREATED     AT CONCENTRATIONS OUTSIDE THIS     RANGE.     ACETAMINOPHEN CONCENTRATIONS     >150 ug/mL AT 4 HOURS AFTER  INGESTION AND >50 ug/mL AT 12     HOURS AFTER INGESTION ARE     OFTEN ASSOCIATED WITH TOXIC     REACTIONS.  SALICYLATE LEVEL     Status: Abnormal   Collection Time    11/29/13  1:34 AM      Result Value Ref Range   Salicylate Lvl <2.0 (*) 2.8 - 20.0 mg/dL  LITHIUM LEVEL     Status: Abnormal   Collection Time    11/29/13  1:46 AM      Result Value Ref Range   Lithium Lvl <0.25 (*) 0.80 - 1.40 mEq/L   URINE RAPID DRUG SCREEN (HOSP PERFORMED)     Status: None   Collection Time    11/29/13  2:05 AM      Result Value Ref Range   Opiates NONE DETECTED  NONE DETECTED   Cocaine NONE DETECTED  NONE DETECTED   Benzodiazepines NONE DETECTED  NONE DETECTED   Amphetamines NONE DETECTED  NONE DETECTED   Tetrahydrocannabinol NONE DETECTED  NONE DETECTED   Barbiturates NONE DETECTED  NONE DETECTED   Comment:            DRUG SCREEN FOR MEDICAL PURPOSES     ONLY.  IF CONFIRMATION IS NEEDED     FOR ANY PURPOSE, NOTIFY LAB     WITHIN 5 DAYS.                LOWEST DETECTABLE LIMITS     FOR URINE DRUG SCREEN     Drug Class       Cutoff (ng/mL)     Amphetamine      1000     Barbiturate      200     Benzodiazepine   200     Tricyclics       300     Opiates          300     Cocaine          300     THC              50  URINALYSIS, ROUTINE W REFLEX MICROSCOPIC     Status: Abnormal   Collection Time    11/29/13  2:05 AM      Result Value Ref Range   Color, Urine YELLOW  YELLOW   APPearance CLEAR  CLEAR   Specific Gravity, Urine 1.005  1.005 - 1.030   pH 6.0  5.0 - 8.0   Glucose, UA NEGATIVE  NEGATIVE mg/dL   Hgb urine dipstick TRACE (*) NEGATIVE   Bilirubin Urine NEGATIVE  NEGATIVE   Ketones, ur NEGATIVE  NEGATIVE mg/dL   Protein, ur NEGATIVE  NEGATIVE mg/dL   Urobilinogen, UA 0.2  0.0 - 1.0 mg/dL   Nitrite NEGATIVE  NEGATIVE   Leukocytes, UA NEGATIVE  NEGATIVE  URINE MICROSCOPIC-ADD ON     Status: None   Collection Time    11/29/13  2:05 AM      Result Value Ref Range   Squamous Epithelial / LPF RARE  RARE   WBC, UA 0-2  <3 WBC/hpf   RBC / HPF 0-2  <3 RBC/hpf   Bacteria, UA RARE  RARE  POC URINE PREG, ED     Status: None   Collection Time    11/29/13  2:12 AM      Result Value Ref Range   Preg Test, Ur NEGATIVE  NEGATIVE   Comment:              THE SENSITIVITY OF THIS     METHODOLOGY IS >24 mIU/mL   Labs are reviewed and are pertinent for medical issue being treated  Current  Facility-Administered Medications  Medication Dose Route Frequency Provider Last Rate Last Dose  . lamoTRIgine (LAMICTAL) tablet 225 mg  225 mg Oral Daily Courtney F Horton, MD   225 mg at 11/29/13 1229  . sertraline (ZOLOFT) tablet 200 mg  200 mg Oral Daily Courtney F Horton, MD   200 mg at 11/29/13 1229   Current Outpatient Prescriptions  Medication Sig Dispense Refill  . gabapentin (NEURONTIN) 100 MG capsule Take 1 capsule (100 mg total) by mouth 2 (two) times daily.  60 capsule  0  . hydrOXYzine (ATARAX/VISTARIL) 50 MG tablet Take 50 mg by mouth 3 (three) times daily as needed for anxiety.      . ibuprofen (ADVIL,MOTRIN) 800 MG tablet Take 800 mg by mouth 3 (three) times daily as needed for moderate pain.      . lamoTRIgine (LAMICTAL) 200 MG tablet Take 200 mg by mouth daily. With 25 mg to equal 225 mg      . lamoTRIgine (LAMICTAL) 25 MG tablet Take 25 mg by mouth daily. With 200 mg to equal 225 mg      . lithium carbonate 300 MG capsule Take 600 mg by mouth at bedtime.      . QUEtiapine (SEROQUEL) 300 MG tablet Take 300 mg by mouth at bedtime.      . sertraline (ZOLOFT) 100 MG tablet Take 2 tablets (200 mg total) by mouth daily.  60 tablet  0  . traZODone (DESYREL) 100 MG tablet Take 1 tablet (100 mg total) by mouth at bedtime as needed for sleep.  30 tablet  0    Psychiatric Specialty Exam:     Blood pressure 137/89, pulse 74, temperature 98.5 F (36.9 C), temperature source Oral, resp. rate 18, SpO2 100.00%.There is no weight on file to calculate BMI.  General Appearance: Casual  Eye Contact::  Absent  Speech:  Clear and Coherent  Volume:  Normal  Mood:  Depressed and Irritable  Affect:  Depressed  Thought Process:  Coherent, Goal Directed and Linear  Orientation:  Full (Time, Place, and Person)  Thought Content:  Hallucinations: Auditory  Suicidal Thoughts:  Yes.  with intent/plan  Homicidal Thoughts:  No  Memory:  Immediate;   Fair Recent;   Fair Remote;   Fair   Judgement:  Poor  Insight:  Fair  Psychomotor Activity:  Decreased  Concentration:  Poor  Recall:  Fair  Fund of Knowledge:Fair  Language: Good  Akathisia:  No  Handed:  Right  AIMS (if indicated):     Assets:  Communication Skills Housing Physical Health  Sleep:      Musculoskeletal: Strength & Muscle Tone: within normal limits Gait & Station: normal Patient leans: N/A  Treatment Plan Summary: Daily contact with patient to assess and evaluate symptoms and progress in treatment Medication management  Admit to [facility] when bed available   Jamison Lord PMH-NP 11/29/2013 2:13 PM 

## 2013-11-29 NOTE — Consult Note (Signed)
Patient seen and evaluated by me, treatment plan is formulated by me. Patient is sad, tearful, has relapsed and feels that she has nothing to live for. Patient is overwhelmed, cannot contract for safety and needs inpatient treatment for stabilization of mood and for safety of patient

## 2013-11-29 NOTE — ED Notes (Signed)
Bed: OZ30 Expected date:  Expected time:  Means of arrival:  Comments: Hold Room #11

## 2013-11-29 NOTE — ED Provider Notes (Signed)
CSN: [id number]     Arrival date & time 11/29/13  0046 History   First MD Initiated Contact with Patient 11/29/13 0110     Chief Complaint  Patient presents with  . Medical Clearance     (Consider location/radiation/quality/duration/timing/severity/associated sxs/prior Treatment) HPI 43 year old female with past medical history of substance abuse, bipolar, depression, and psychosis presents from [facility] in police custody after making suicidal threats. In the ER the patient is agitated and is not responding to my questions. She is continuously asking for nursing staff and techs to get out of her room. She states she does not have to answer my questions. She is getting up and verbally assaulting staff/making verbal threats. Unknown if she had any substances prior to arrival. The rest of the history is unobtainable due to her agitation and aggression.  Past Medical History  Diagnosis Date  . Hypertension   . Leg pain   . Substance abuse   . Bipolar 1 disorder   . Depression   . Psychosis   . Carpal tunnel syndrome    Past Surgical History  Procedure Laterality Date  . Appendectomy    . Tonsillectomy     Family History  Problem Relation Age of Onset  . Lung cancer Mother   . Colon cancer Father   . Diabetes Father   . Cancer Maternal Grandmother   . Cancer Maternal Grandfather   . Cancer Paternal Grandmother   . Prostate cancer Father   . Colon polyps Mother    History  Substance Use Topics  . Smoking status: Former Smoker    Quit date: 07/11/1996  . Smokeless tobacco: Never Used  . Alcohol Use: 7.2 oz/week    12 Cans of beer per week     Comment: 2-3 40 oz daily    OB History   Grav Para Term Preterm Abortions TAB SAB Ect Mult Living   8 7 6 1 1 1    7      Review of Systems  Unable to perform ROS: Psychiatric disorder      Allergies  Latex  Home Medications   Prior to Admission medications   Medication Sig Start Date End Date Taking? Authorizing Provider   chlorproMAZINE (THORAZINE) 25 MG tablet Take 1 tablet (25 mg total) by mouth at bedtime. 08/18/13   Waylan Boga, NP  docusate sodium (COLACE) 100 MG capsule Take 100 mg by mouth 2 (two) times daily as needed for mild constipation.    Historical Provider, MD  gabapentin (NEURONTIN) 100 MG capsule Take 1 capsule (100 mg total) by mouth 2 (two) times daily. 08/18/13   Waylan Boga, NP  hydrOXYzine (ATARAX/VISTARIL) 25 MG tablet Take 1 tablet (25 mg total) by mouth 2 (two) times daily. 08/18/13   Waylan Boga, NP  ibuprofen (ADVIL,MOTRIN) 800 MG tablet Take 800 mg by mouth 3 (three) times daily as needed for moderate pain.    Historical Provider, MD  lamoTRIgine (LAMICTAL) 25 MG tablet Take 9 tablets (225 mg total) by mouth daily. 08/18/13   Waylan Boga, NP  lithium carbonate 300 MG capsule Take 3 capsules (900 mg total) by mouth at bedtime. 08/18/13   Waylan Boga, NP  lithium carbonate 600 MG capsule Take 1 capsule (600 mg total) by mouth at bedtime. 08/19/13   Elmarie Shiley, NP  lubiprostone (AMITIZA) 8 MCG capsule Take 1 capsule (8 mcg total) by mouth 2 (two) times daily with a meal. 08/18/13   Waylan Boga, NP  ranitidine (ZANTAC) 150 MG tablet  Take 1 tablet (150 mg total) by mouth as needed for heartburn (no more than 2 tabs per 24 hours). 08/18/13   Waylan Boga, NP  sertraline (ZOLOFT) 100 MG tablet Take 2 tablets (200 mg total) by mouth daily. 08/18/13   Waylan Boga, NP  traZODone (DESYREL) 100 MG tablet Take 1 tablet (100 mg total) by mouth at bedtime as needed for sleep. 08/18/13   Waylan Boga, NP   BP 155/100  Pulse 108  Temp(Src) 99.2 F (37.3 C) (Oral)  Resp 20  SpO2 100% Physical Exam  Nursing note and vitals reviewed. Constitutional: She is oriented to person, place, and time. She appears well-developed and well-nourished.  HENT:  Head: Normocephalic and atraumatic.  Right Ear: External ear normal.  Left Ear: External ear normal.  Nose: Nose normal.  Eyes: Right eye exhibits no discharge. Left  eye exhibits no discharge.  Cardiovascular: Regular rhythm and normal heart sounds.  Tachycardia present.   Pulmonary/Chest: Effort normal.  Abdominal: She exhibits no distension.  Neurological: She is alert and oriented to person, place, and time.  Skin: Skin is warm and dry.  Psychiatric: Her affect is angry. She is agitated, aggressive and combative.    ED Course  Procedures (including critical care time) Labs Review Labs Reviewed  CBC WITH DIFFERENTIAL - Abnormal; Notable for the following:    HCT 34.8 (*)    Neutrophils Relative % 37 (*)    Lymphocytes Relative 55 (*)    Lymphs Abs 4.5 (*)    All other components within normal limits  COMPREHENSIVE METABOLIC PANEL - Abnormal; Notable for the following:    Potassium 3.0 (*)    Glucose, Bld 154 (*)    GFR calc non Af Amer 79 (*)    All other components within normal limits  URINALYSIS, ROUTINE W REFLEX MICROSCOPIC - Abnormal; Notable for the following:    Hgb urine dipstick TRACE (*)    All other components within normal limits  ETHANOL - Abnormal; Notable for the following:    Alcohol, Ethyl (B) 111 (*)    All other components within normal limits  SALICYLATE LEVEL - Abnormal; Notable for the following:    Salicylate Lvl <9.0 (*)    All other components within normal limits  LITHIUM LEVEL - Abnormal; Notable for the following:    Lithium Lvl <0.25 (*)    All other components within normal limits  URINE RAPID DRUG SCREEN (HOSP PERFORMED)  ACETAMINOPHEN LEVEL  URINE MICROSCOPIC-ADD ON  POC URINE PREG, ED    Imaging Review No results found.   EKG Interpretation None      MDM   Final diagnoses:  Psychosis    Patient is uncooperative here, possibly under the influence but also likely has a component of recurrent psychosis. Given this, I do not feel she is safe to leave the ER and was chemically and physically restrained. Will IVC given her symptoms and suicidal threats. Psych consulted.    Ephraim Hamburger,  MD 11/29/13 [phone]

## 2013-11-29 NOTE — ED Notes (Signed)
Belongings inventoried:  Large Pocketbook containing keys, wallet with drivers license and miscellaneous cards inside, keys, eyedrops, charger, cell phone with case, loose change in bottom of pocketbook.  Pants, shirt, bra, panties, camisole, socks, rain type boots, large sweater.  All items had been previously searched by security.  RN placed belongings in locker #26.

## 2013-11-29 NOTE — ED Notes (Signed)
[name], [name],[phone]. PLEASE CALL WHEN DISPOSITION IS SET

## 2013-11-29 NOTE — ED Notes (Signed)
MD at bedside, Dr. Dwyane Dee.

## 2013-11-30 DIAGNOSIS — F3130 Bipolar disorder, current episode depressed, mild or moderate severity, unspecified: Principal | ICD-10-CM

## 2013-11-30 DIAGNOSIS — F1920 Other psychoactive substance dependence, uncomplicated: Secondary | ICD-10-CM

## 2013-11-30 MED ORDER — DOCUSATE SODIUM 100 MG PO CAPS
100.0000 mg | ORAL_CAPSULE | Freq: Two times a day (BID) | ORAL | Status: DC
  Administered 2013-11-30 – 2013-12-11 (×20): 100 mg via ORAL
  Filled 2013-11-30 (×26): qty 1

## 2013-11-30 MED ORDER — MAGNESIUM CITRATE PO SOLN
1.0000 | Freq: Once | ORAL | Status: AC
  Administered 2013-11-30: 1 via ORAL

## 2013-11-30 MED ORDER — IBUPROFEN 200 MG PO TABS
600.0000 mg | ORAL_TABLET | Freq: Four times a day (QID) | ORAL | Status: DC | PRN
  Administered 2013-11-30 – 2013-12-04 (×5): 600 mg via ORAL
  Filled 2013-11-30: qty 1
  Filled 2013-11-30 (×2): qty 3
  Filled 2013-11-30: qty 1
  Filled 2013-11-30 (×2): qty 3

## 2013-11-30 MED ORDER — CHLORDIAZEPOXIDE HCL 25 MG PO CAPS: 25.0000 mg | ORAL_CAPSULE | Freq: Four times a day (QID) | ORAL | Status: DC | PRN

## 2013-11-30 MED ORDER — QUETIAPINE FUMARATE 200 MG PO TABS
200.0000 mg | ORAL_TABLET | Freq: Once | ORAL | Status: AC
  Administered 2013-11-30: 200 mg via ORAL

## 2013-11-30 MED ORDER — QUETIAPINE FUMARATE 100 MG PO TABS
100.0000 mg | ORAL_TABLET | Freq: Three times a day (TID) | ORAL | Status: DC
  Administered 2013-11-30 – 2013-12-09 (×25): 100 mg via ORAL
  Filled 2013-11-30 (×12): qty 1
  Filled 2013-11-30: qty 2
  Filled 2013-11-30 (×20): qty 1

## 2013-11-30 MED ORDER — POLYETHYLENE GLYCOL 3350 17 G PO PACK
17.0000 g | PACK | Freq: Every day | ORAL | Status: DC
  Administered 2013-11-30 – 2013-12-11 (×12): 17 g via ORAL
  Filled 2013-11-30 (×15): qty 1

## 2013-11-30 MED ORDER — LORAZEPAM 1 MG PO TABS
1.0000 mg | ORAL_TABLET | Freq: Four times a day (QID) | ORAL | Status: DC | PRN
  Administered 2013-11-30 – 2013-12-04 (×10): 1 mg via ORAL
  Filled 2013-11-30 (×9): qty 1

## 2013-11-30 NOTE — BHH Group Notes (Signed)
[facility] Group Notes:  (Nursing/MHT/Case Management/Adjunct)  Date:  11/30/2013  Time:  10:01 AM  Type of Therapy:  self inventory  Participation Level:  Did Not Attend  Participation Quality:  n/a  Affect:  n/a  Cognitive:  n/a  Insight:  None  Engagement in Group:  n/a  Modes of Intervention:  n/a  Summary of Progress/Problems:  Tretha Sciara 11/30/2013, 10:01 AM

## 2013-11-30 NOTE — BHH Group Notes (Signed)
[facility] Group Notes:  (Clinical Social Work)  11/30/2013     10-11AM  Summary of Progress/Problems:   The main focus of today's process group was for the patient to identify ways in which they have in the past sabotaged their own recovery. Motivational Interviewing and a worksheet were utilized to help patients explore in depth the perceived benefits and costs of their substance use, as well as the potential benefits and costs of stopping.  The Stages of Change were explained using a handout, with an emphasis on making plans to deal with sabotaging behaviors proactively.  The patient expressed that her self-sabotaging behavior is isolating herself and letting everything get to her.  She then adds the using and nobody knows she is using because she is isolating.  She expressed a good deal of anger at NA, saying it is a "f'ing fashion show where they just all brag about being clean so long."  Type of Therapy:  Group Therapy - Process   Participation Level:  Active  Participation Quality:  Resistant  Affect:  Irritable  Cognitive:  Oriented  Insight:  Limited  Engagement in Therapy:  Improving  Modes of Intervention:  Education, Support and Processing, Motivational Interviewing  Selmer Dominion, LCSW 11/30/2013, 12:22 PM

## 2013-11-30 NOTE — Progress Notes (Signed)
Psychoeducational Group Note  Date:  11/30/2013 Time:  0945 am  Group Topic/Focus:  Identifying Needs:   The focus of this group is to help patients identify their personal needs that have been historically problematic and identify healthy behaviors to address their needs.  Participation Level:  Did Not Attend    [name] 11/30/2013,3:48 PM

## 2013-11-30 NOTE — Progress Notes (Signed)
Patient ID: [name], female   DOB: Jun 18, 1971, 43 y.o.   MRN: [id number] D)  Has been out on the hall and has begun to integrate into the milieu, interacting appropriately with staff and select peers.  Attended AA group this evening, participated, and came to the med window afterward to ask if she had hs meds.  Admitted to feeling anxious and was rather fidgety, verified her meds and dosages and was accepting and compliant.  Currently denies thoughts of self harm and contracts for safety. A)  Will continue to monitor for safety, continue POC R0  Safety maintained.
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
## 2013-11-30 NOTE — Progress Notes (Signed)
At around 1500 pt was in her room yelling cursing hitting her bed  Talking about how she was treated in the ER  And how she wants to go home   She received medications and deescelation   She took medications and is presently calmer and resting in her bed
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
## 2013-11-30 NOTE — Progress Notes (Signed)
D   Pt is depressed and anxious   She brightens on approach and is cooperative    She reports sleeping well after requesting sleep medications   Her appetite is fair   Energy level low ability to pay attention is improving   She rates her depression and hopelessness at a 10   She reports agitation and craving as her withdrawal symptoms  She is also having some moderate tremors   She reports having suicidal ideation and she contracts for safety while at [facility]   Pt made the remark on her inventory that she just wants to die and is tired of life but has no plan and will not attempt here  Pt has not thoughts or plans for what she can do to better take care of herself when she goes home A   Verbal support given   Medications administered and effectiveness monitored   Q 15 min checks   Encourage group attendance  Discuss positive aspects of pt life to help increase her hopefullness R   Pt is safe at present and verbalizes understanding

## 2013-11-30 NOTE — BHH Suicide Risk Assessment (Signed)
Suicide Risk Assessment  Admission Assessment     Nursing information obtained from:    Demographic factors:    Current Mental Status:    Loss Factors:    Historical Factors:    Risk Reduction Factors:    Total Time spent with patient: 45 minutes  CLINICAL FACTORS:   Bipolar Disorder:   Depressive phase Alcohol/Substance Abuse/Dependencies  PCOGNITIVE FEATURES THAT CONTRIBUTE TO RISK:  Closed-mindedness Polarized thinking Thought constriction (tunnel vision)    SUICIDE RISK:   Moderate:  Frequent suicidal ideation with limited intensity, and duration, some specificity in terms of plans, no associated intent, good self-control, limited dysphoria/symptomatology, some risk factors present, and identifiable protective factors, including available and accessible social support.  PLAN OF CARE: Supportive approach/coping skills/identify detox needs                               Reassess and resume medications and optimize response  I certify that inpatient services furnished can reasonably be expected to improve the patient's condition.  Nicholaus Bloom 11/30/2013, 7:29 PM

## 2013-12-01 NOTE — Progress Notes (Signed)
[facility] MD Progress Note  12/01/2013 3:54 PM [name]  MRN:  [id number] Subjective:  Chanelle continues to experience the auditory hallucinations but not as strongly as she was doing yesterday. There is some sedation coming from the the Seroquel. Will adjust the daytime dose. She expressed a sense of hapelessness responding to the voices Diagnosis:   DSM5: Schizophrenia Disorders:  none Obsessive-Compulsive Disorders:  none Trauma-Stressor Disorders:  none Substance/Addictive Disorders:  Alcohol Related Disorder - Severe (303.90) Depressive Disorders:  Major Depressive Disorder - with Psychotic Features (296.24) Total Time spent with patient: 30 minutes  Axis I: Bipolar, Depressed  ADL's:  Intact  Sleep: Fair  Appetite:  Fair  Suicidal Ideation:  Plan:  denies Intent:  denies Means:  denies Homicidal Ideation:  Plan:  denies Intent:  denies Means:  denies AEB (as evidenced by):  Psychiatric Specialty Exam: Physical Exam  Review of Systems  Constitutional: Positive for malaise/fatigue.  HENT: Negative.   Eyes: Negative.   Respiratory: Negative.   Cardiovascular: Negative.   Gastrointestinal: Negative.   Genitourinary: Negative.   Musculoskeletal: Negative.   Skin: Negative.   Neurological: Positive for weakness.  Endo/Heme/Allergies: Negative.   Psychiatric/Behavioral: Positive for depression, hallucinations and substance abuse. The patient is nervous/anxious and has insomnia.     Blood pressure 124/90, pulse 81, temperature 98.8 F (37.1 C), temperature source Oral, resp. rate 20, height 5\' 5"  (1.651 m), weight 72.576 kg (160 lb).Body mass index is 26.63 kg/(m^2).  General Appearance: Disheveled  Eye Contact::  Minimal  Speech:  not spontaneous  Volume:  Decreased  Mood:  Anxious, Dysphoric and Hopeless  Affect:  Restricted  Thought Process:  Coherent and Goal Directed  Orientation:  Full (Time, Place, and Person)  Thought Content:  Hallucinations:  Auditory and symtpoms, worries concerns  Suicidal Thoughts:  No  Homicidal Thoughts:  No  Memory:  Immediate;   Fair Recent;   Fair Remote;   Fair  Judgement:  Fair  Insight:  Present and Shallow  Psychomotor Activity:  Restlessness  Concentration:  Fair  Recall:  [employer] of Knowledge:NA  Language: Fair  Akathisia:  No  Handed:    AIMS (if indicated):     Assets:  Desire for Improvement  Sleep:  Number of Hours: 6   Musculoskeletal: Strength & Muscle Tone: within normal limits Gait & Station: normal Patient leans: N/A  Current Medications: Current Facility-Administered Medications  Medication Dose Route Frequency Provider Last Rate Last Dose  . acetaminophen (TYLENOL) tablet 650 mg  650 mg Oral Q6H PRN Waylan Boga, NP      . alum & mag hydroxide-simeth (MAALOX/MYLANTA) 200-200-20 MG/5ML suspension 30 mL  30 mL Oral Q4H PRN Waylan Boga, NP      . docusate sodium (COLACE) capsule 100 mg  100 mg Oral BID Nicholaus Bloom, MD   100 mg at 11/30/13 1703  . gabapentin (NEURONTIN) capsule 100 mg  100 mg Oral BID Waylan Boga, NP   100 mg at 12/01/13 0800  . hydrOXYzine (ATARAX/VISTARIL) tablet 50 mg  50 mg Oral TID PRN Waylan Boga, NP   50 mg at 11/30/13 2048  . ibuprofen (ADVIL,MOTRIN) tablet 600 mg  600 mg Oral Q6H PRN Nicholaus Bloom, MD   600 mg at 11/30/13 1201  . lamoTRIgine (LAMICTAL) tablet 225 mg  225 mg Oral Daily Waylan Boga, NP   225 mg at 12/01/13 0800  . lithium carbonate capsule 600 mg  600 mg Oral QHS Waylan Boga, NP  600 mg at 11/30/13 2048  . LORazepam (ATIVAN) tablet 1 mg  1 mg Oral Q6H PRN Nicholaus Bloom, MD   1 mg at 12/01/13 1417  . magnesium hydroxide (MILK OF MAGNESIA) suspension 30 mL  30 mL Oral Daily PRN Waylan Boga, NP      . polyethylene glycol (MIRALAX / GLYCOLAX) packet 17 g  17 g Oral Daily Nicholaus Bloom, MD   17 g at 12/01/13 0750  . QUEtiapine (SEROQUEL) tablet 100 mg  100 mg Oral TID Nicholaus Bloom, MD   100 mg at 12/01/13 1128  . QUEtiapine  (SEROQUEL) tablet 300 mg  300 mg Oral QHS Waylan Boga, NP   300 mg at 11/30/13 2048  . traZODone (DESYREL) tablet 100 mg  100 mg Oral QHS PRN Waylan Boga, NP   100 mg at 11/30/13 2048    Lab Results: No results found for this or any previous visit (from the past 48 hour(s)).  Physical Findings: AIMS: Facial and Oral Movements Muscles of Facial Expression: None, normal Lips and Perioral Area: None, normal Jaw: None, normal Tongue: None, normal,Extremity Movements Upper (arms, wrists, hands, fingers): None, normal Lower (legs, knees, ankles, toes): None, normal, Trunk Movements Neck, shoulders, hips: None, normal, Overall Severity Severity of abnormal movements (highest score from questions above): None, normal Incapacitation due to abnormal movements: None, normal Patient's awareness of abnormal movements (rate only patient's report): No Awareness, Dental Status Current problems with teeth and/or dentures?: No Does patient usually wear dentures?: No  CIWA:  CIWA-Ar Total: 16 COWS:     Treatment Plan Summary: Daily contact with patient to assess and evaluate symptoms and progress in treatment Medication management  Plan: Supportive approach/coping skills/relapse prevention           Optimize treatment with psychotropics            Medical Decision Making Problem Points:  Review of psycho-social stressors (1) Data Points:  Review of new medications or change in dosage (2)  I certify that inpatient services furnished can reasonably be expected to improve the patient's condition.   Nicholaus Bloom 12/01/2013, 3:54 PM

## 2013-12-01 NOTE — Progress Notes (Signed)
Pt found sitting in the floor near bed by MHT. Pt stated she was trying to get dressed to go to lunch. Writer took vitals manually while pt was sitting in bed 124/90. Pt stated she slid down to the floor. No pain verbalized. Long call bell given. Meal brought back. Pt remains safe on unit

## 2013-12-01 NOTE — BHH Group Notes (Signed)
[place] Group Notes:  (Clinical Social Work)  12/01/2013  10:00-11:00AM  Summary of Progress/Problems:   The main focus of today's process group was to discuss healthy supports that are needed versus unhealthy supports and what to do about them.  Multiple group members were quite negative and difficult to redirect, and it was necessary to remind the group several times of the Stages of Change discussed yesterday, including the fact that not everyone has decided to pursue sobriety.  CSW introduced a variety of possible supports to pursue and used scaling question (1 = least to 10 = most) to address each patient's willingness to "do something different to pursue sobriety."  [name] was one of the most negative group members, disagreeing with almost everything said and displaying little insight into how she was not being respectful of the opinions and statements of fellow group members.  She sat with her eyes closed for much of group, and slurred her words a great deal, leading CSW to believe she was not being attentive; however, she was offended at not being called on to provide a number for the scaling questions mentioned above.  Type of Therapy:  Process Group with Motivational Interviewing  Participation Level:  Minimal  Participation Quality:  Drowsy, Intrusive and Inattentive  Affect:  Depressed and Flat  Cognitive:  Lacking  Insight:  Lacking  Engagement in Therapy:   Limited  Modes of Intervention:   Education, Support and Processing, Activity  Selmer Dominion, LCSW 12/01/2013, 12:15pm

## 2013-12-01 NOTE — Progress Notes (Signed)
Patient did not attend the evening speaker AA meeting.  Pt was notified that group was beginning but remained in her bed.   

## 2013-12-01 NOTE — Progress Notes (Signed)
D: pt still hearing and responding to voices. Pt sedated by medication and barely able to keep eyes open during conversation. Pt stating si with no plan, but contracts for safety. Pt stated voices are saying a lot of different things and is asking for an ativan to make them stop. Avoids eye contact. Flat facial expression A: pt placed on high fall risk. q 15 min safety checks. MD notified of how pt is acting. Support and encouragement offered R: pt remains safe on unit. No signs of distress noted

## 2013-12-01 NOTE — Progress Notes (Signed)
[place] Group Notes:  (Nursing/MHT/Case Management/Adjunct)  Date:  12/01/2013  Time:  12:28 AM  Type of Therapy:  AA  Participation Level:  Did Not Attend  Participation Quality:  Did Not Attend  Affect:  Did Not Attend  Cognitive:  Did Not Attend  Insight:  None  Engagement in Group:  Did Not Attend  Modes of Intervention:  Education  Summary of Progress/Problems: Pt. Was sleeping in bed.  Lanell Persons 12/01/2013, 12:28 AM

## 2013-12-01 NOTE — Progress Notes (Signed)
Psychoeducational Group Note  Date:  12/01/2013 Time:  1315  Group Topic/Focus:  Making Healthy Choices:   The focus of this group is to help patients identify negative/unhealthy choices they were using prior to admission and identify positive/healthier coping strategies to replace them upon discharge.  Participation Level:  Did Not Attend   Migdalia Dk 12/01/2013, 10:29 AM

## 2013-12-01 NOTE — Progress Notes (Signed)
Patient ID: [name], female   DOB: July 20, 1970, 43 y.o.   MRN: [id number]  Patient needing assistance to her room due to being slightly unsteady. No distress noted at this time. Pt eating breakfast in her room.

## 2013-12-01 NOTE — Progress Notes (Signed)
Patient ID: [name], female   DOB: [birth year], 43 y.o.   MRN: [id number]  D: Patient confused at times, having active A/V hallucinations. Pt redirectable but needing much assurance that nobody was in her room. Pt with tremors and anxiety, restless at times. A: Q 15 minute safety checks, encourage staff/peer interaction, and group participation. Administer medications as ordered by MD. R: Pt compliant with medications, unable to tolerate group sessions. Pt continues to have hallucinations of people talking to her in her room. No distress noted at this time. Pt denies SI.

## 2013-12-01 NOTE — BHH Group Notes (Signed)
[place] Group Notes:  (Nursing/MHT/Case Management/Adjunct)  Date:  12/01/2013  Time:  10:45 AM  Type of Therapy:  self inventory  Participation Level:  Did Not Attend  Participation Quality:  n/a  Affect:  n/a  Cognitive:  n/a  Insight:  None  Engagement in Group:  n/a  Modes of Intervention:  n/a  Summary of Progress/Problems:  Tretha Sciara 12/01/2013, 10:45 AM

## 2013-12-02 DIAGNOSIS — R45851 Suicidal ideations: Secondary | ICD-10-CM

## 2013-12-02 NOTE — Progress Notes (Signed)
D   Pt was sitting in the dayroom eating snack   She remembered me by name as I was talking to her   She said at the time she wanted to get her sleep medications early tonight   Her thinking is more logical and her speech is coherent   When she came to the window to get her medication she said give me everything I can get   She has an unsteady gait but seems to have a more level mood A   Verbal support given   Medications administered and effectiveness monitored   Pt has a 1:1 sitter due to her unsteady gait and unpredictable behavior R   Pt safe at present

## 2013-12-02 NOTE — Progress Notes (Signed)
1:1 Note  D: Patient denies HI. Patient is positive for auditory and visual hallucinations and SI but patient verbally contracts for safety. Initially, patient appeared as lethargic but patient began interacting more when pressed by RN and then presented as logical, coherent but agitated. The patient denies any weakness but states that she feels "numb all over." When asked for clarification, the patient states that she wants to end her life. The patient also admits to having AVH and states that she "hears and sees people throughout the day."   A: Patient given emotional support from RN. Patient encouraged to come to staff with concerns and/or questions. Patient's medication routine continued. Patient's orders and plan of care reviewed. Patient was encouraged to increase her participation within the milieu and to gradually increase her activity level as tolerated. RN and MHT offered patient support and help with mobility if needed (patient denies any need for help with mobility at this time).  R: Patient remains safe. Patient currently in room resting with MHT present. Patient continues to deny any mobility problems and states that she "doesn't want to be on a 1:1." Will continue to monitor patient q15 minutes for safety.

## 2013-12-02 NOTE — Progress Notes (Signed)
21:30 Denies SI or HI. Pt is awake with complaints of hearing muffuld voices. Patient is requesting medication to make the voice stop. Pt denies command auditory/visual hallucinations, patient is responding to internal stimuli.Pt has slurred speech and unsteady gate. Pt was assisted back to room after evening group.  Will continue to monitor for safety.   2245- pt is pushing the call bell with unintelligible communication, pt is redirectable at times. Pt placed on one-to-one for safety/ fall precautions.

## 2013-12-02 NOTE — Progress Notes (Signed)
1:1 Note  Patient currently in dayroom with MHT present. Patient requesting to be taken off 1:1. Patient was given education regarding this and patient verbalized understanding. Patient remains cooperative at this time. Will continue to monitor patient for safety.

## 2013-12-02 NOTE — Progress Notes (Signed)
1:1 Note  Patient currently in room resting with MTH present. Patient denies any concerns or needs at this time. Will continue to monitor patient for safety.

## 2013-12-02 NOTE — Progress Notes (Signed)
[facility] MD Progress Note  12/02/2013 12:05 PM [name]  MRN:  [id number] Subjective: Pt was interviewed with female 1:1 sitter present, and chart reviewed. Pt reports doing "great". Pt appears to demonstrate disorganized thoughts at times. Pt requesting ativan script to be given upon discharge. Pt has an unsteady gait in the hallway, holding the siderails at times. Pt reports sleeping intermittently last night (waking up often), so tired today. Pt was falling asleep at times during interview. Pt cont to report SI, with a plan to OD on pills and alcohol. Pt denies HI. Pt reports AH stating "I'm stupid", and VH of her mom. Pt is dressed bizarrely, with a shirt wrapped around her upper body. Pt seen interacting well and joking with her 1:1 sitter. Diagnosis:   DSM5: Schizophrenia Disorders:  none Obsessive-Compulsive Disorders:  none Trauma-Stressor Disorders:  none Substance/Addictive Disorders:  Alcohol Related Disorder - Severe (303.90) Depressive Disorders:  Major Depressive Disorder - with Psychotic Features (296.24) Total Time spent with patient: 30 minutes  Axis I: Bipolar, Depressed  ADL's:  Intact  Sleep: Poor  Appetite:  Fair  Suicidal Ideation:  +SI, with plan to OD on pills and alcohol. Homicidal Ideation:  Plan:  denies Intent:  denies Means:  denies AEB (as evidenced by):  Psychiatric Specialty Exam: Physical Exam  Review of Systems  Constitutional: Positive for malaise/fatigue.  HENT: Negative.   Eyes: Negative.   Respiratory: Negative.   Cardiovascular: Negative.   Gastrointestinal: Negative.   Genitourinary: Negative.   Musculoskeletal: Negative.   Skin: Negative.   Neurological: Positive for weakness.  Endo/Heme/Allergies: Negative.   Psychiatric/Behavioral: Positive for depression, hallucinations and substance abuse. The patient is nervous/anxious and has insomnia.     Blood pressure 106/80, pulse 107, temperature 99.1 F (37.3 C), temperature source  Oral, resp. rate 20, height 5\' 5"  (1.651 m), weight 72.576 kg (160 lb).Body mass index is 26.63 kg/(m^2).  General Appearance: Disheveled  Eye Contact::  Minimal  Speech:  not spontaneous  Volume:  Decreased  Mood:  Anxious, Dysphoric and Hopeless  Affect:  Restricted  Thought Process:  Disorganized and Irrelevant  Orientation:  Full (Time, Place, and Person)  Thought Content:  Hallucinations: Auditory and symtpoms, worries concerns, +VH of mom  Suicidal Thoughts:  Yes.  with intent/plan  Homicidal Thoughts:  No  Memory:  Immediate;   Fair Recent;   Fair Remote;   Fair  Judgement:  Poor  Insight:  Lacking  Psychomotor Activity:  Restlessness  Concentration:  Fair  Recall:  [employer] of Knowledge:NA  Language: Fair  Akathisia:  No  Handed:    AIMS (if indicated):     Assets:  Desire for Improvement  Sleep:  Number of Hours: 4.5   Musculoskeletal: Strength & Muscle Tone: within normal limits Gait & Station: unsteady Patient leans: N/A  Current Medications: Current Facility-Administered Medications  Medication Dose Route Frequency Provider Last Rate Last Dose  . acetaminophen (TYLENOL) tablet 650 mg  650 mg Oral Q6H PRN Waylan Boga, NP      . alum & mag hydroxide-simeth (MAALOX/MYLANTA) 200-200-20 MG/5ML suspension 30 mL  30 mL Oral Q4H PRN Waylan Boga, NP      . docusate sodium (COLACE) capsule 100 mg  100 mg Oral BID Nicholaus Bloom, MD   100 mg at 12/02/13 0834  . gabapentin (NEURONTIN) capsule 100 mg  100 mg Oral BID Waylan Boga, NP   100 mg at 12/02/13 0834  . hydrOXYzine (ATARAX/VISTARIL) tablet 50 mg  50  mg Oral TID PRN Waylan Boga, NP   50 mg at 11/30/13 2048  . ibuprofen (ADVIL,MOTRIN) tablet 600 mg  600 mg Oral Q6H PRN Nicholaus Bloom, MD   600 mg at 11/30/13 1201  . lamoTRIgine (LAMICTAL) tablet 225 mg  225 mg Oral Daily Waylan Boga, NP   225 mg at 12/02/13 9147  . lithium carbonate capsule 600 mg  600 mg Oral QHS Waylan Boga, NP   600 mg at 12/01/13 2144  .  LORazepam (ATIVAN) tablet 1 mg  1 mg Oral Q6H PRN Nicholaus Bloom, MD   1 mg at 12/02/13 8295  . magnesium hydroxide (MILK OF MAGNESIA) suspension 30 mL  30 mL Oral Daily PRN Waylan Boga, NP      . polyethylene glycol (MIRALAX / GLYCOLAX) packet 17 g  17 g Oral Daily Nicholaus Bloom, MD   17 g at 12/02/13 [id number]  . QUEtiapine (SEROQUEL) tablet 100 mg  100 mg Oral TID Nicholaus Bloom, MD   100 mg at 12/02/13 1133  . QUEtiapine (SEROQUEL) tablet 300 mg  300 mg Oral QHS Waylan Boga, NP   300 mg at 12/01/13 2144  . traZODone (DESYREL) tablet 100 mg  100 mg Oral QHS PRN Waylan Boga, NP   100 mg at 11/30/13 2048    Lab Results: No results found for this or any previous visit (from the past 48 hour(s)).  Physical Findings: AIMS: Facial and Oral Movements Muscles of Facial Expression: None, normal Lips and Perioral Area: None, normal Jaw: None, normal Tongue: None, normal,Extremity Movements Upper (arms, wrists, hands, fingers): None, normal Lower (legs, knees, ankles, toes): None, normal, Trunk Movements Neck, shoulders, hips: None, normal, Overall Severity Severity of abnormal movements (highest score from questions above): None, normal Incapacitation due to abnormal movements: None, normal Patient's awareness of abnormal movements (rate only patient's report): No Awareness, Dental Status Current problems with teeth and/or dentures?: No Does patient usually wear dentures?: No  CIWA:  CIWA-Ar Total: 16 COWS:     Treatment Plan Summary: Daily contact with patient to assess and evaluate symptoms and progress in treatment Medication management  Plan: Supportive approach/coping skills/relapse prevention           Optimize treatment with psychotropics  Cont 1:1 for safety            Medical Decision Making Problem Points:  Review of psycho-social stressors (1) Data Points:  Review of new medications or change in dosage (2)  I certify that inpatient services furnished can reasonably be expected  to improve the patient's condition.   [name] 12/02/2013, 12:05 PM

## 2013-12-02 NOTE — Progress Notes (Signed)
Adult Psychoeducational Group Note  Date:  12/02/2013 Time:  9:45 PM  Group Topic/Focus:  Wrap-Up Group:   The focus of this group is to help patients review their daily goal of treatment and discuss progress on daily workbooks.  Participation Level:  Minimal  Participation Quality:  minimal  Affect:  Flat  Cognitive:  Lacking  Insight: Limited  Engagement in Group:  Poor  Modes of Intervention:  Education, Socialization and Support  Additional Comments:  Patient attended and had limited participation in group tonight. She reports that today was just another day. She attended some of her groups, meet with her doctor and Education officer, museum. She then stated that "she was done talking".  Ernie Sagrero Aviva Kluver 12/02/2013, 9:45 PM

## 2013-12-03 NOTE — Progress Notes (Signed)
Pt resting in bed with eyes closed and no distress noted  Pt is on 1:1  Safe at present

## 2013-12-03 NOTE — Progress Notes (Signed)
Patient ID: [name], female   DOB: 1970/10/12, 43 y.o.   MRN: [id number] [facility] MD Progress Note  12/03/2013 2:04 PM [name]  MRN:  [id number] Subjective: Pt was interviewed with female 1:1 sitter present, and chart reviewed. Pt affirms SI and HI with "any way possible" as intention/means. Pt appears to be rehearsed in her statements. When asked why she was walking around yesterday and had trouble walking today, pt stated "why don't you just make the sitter leave then!". Pt said "I'm leaving here soon anyway". This NP informed pt that she may not leave if she is unstable. Pt asked if she should pretend to be cheerful and get along with others, pt was visibly agitated. This NP reminded pt that group participation is an important way that we evaluate her progress and that if she has been walking so well, she should continue to ambulate so that we can remove the sitter. Pt started laughing and stated that she didn't want to get out of bed to demonstrate that she could walk well, but reported "I can walk just fine, I just don't want to get out of bed". Pt was very argumentative and it appeared as though the patient was in agreement that she could indeed ambulate well without assistance, however pt continued to refuse to demonstrate this so that the sitter could be removed. Pt also affirms AVH with voices telling her to hurt others    Diagnosis:   DSM5: Schizophrenia Disorders:  none Obsessive-Compulsive Disorders:  none Trauma-Stressor Disorders:  none Substance/Addictive Disorders:  Alcohol Related Disorder - Severe (303.90) Depressive Disorders:  Major Depressive Disorder - with Psychotic Features (296.24) Total Time spent with patient: 30 minutes  Axis I: Bipolar, Depressed  ADL's:  Intact  Sleep: Poor  Appetite:  Fair  Suicidal Ideation:  +SI, with plan to OD on pills and alcohol. Homicidal Ideation:  Plan:  denies Intent:  denies Means:  denies AEB (as evidenced  by):  Psychiatric Specialty Exam: Physical Exam  Review of Systems  Constitutional: Positive for malaise/fatigue.  HENT: Negative.   Eyes: Negative.   Respiratory: Negative.   Cardiovascular: Negative.   Gastrointestinal: Negative.   Genitourinary: Negative.   Musculoskeletal: Negative.   Skin: Negative.   Neurological: Positive for weakness.  Endo/Heme/Allergies: Negative.   Psychiatric/Behavioral: Positive for depression, hallucinations and substance abuse. The patient is nervous/anxious and has insomnia.     Blood pressure 113/75, pulse 96, temperature 98.5 F (36.9 C), temperature source Oral, resp. rate 16, height 5\' 5"  (1.651 m), weight 72.576 kg (160 lb).Body mass index is 26.63 kg/(m^2).  General Appearance: Disheveled  Eye Contact::  Minimal  Speech:  not spontaneous  Volume:  Decreased  Mood:  Anxious, Dysphoric and Hopeless  Affect:  Restricted  Thought Process:  Disorganized and Irrelevant  Orientation:  Full (Time, Place, and Person)  Thought Content:  Hallucinations: Auditory and symtpoms, worries concerns, +VH of mom  Suicidal Thoughts:  Yes.  with intent/plan  Homicidal Thoughts:  Yes.  with intent/plan  Memory:  Immediate;   Fair Recent;   Fair Remote;   Fair  Judgement:  Poor  Insight:  Lacking  Psychomotor Activity:  Restlessness  Concentration:  Fair  Recall:  [employer] of Knowledge:NA  Language: Fair  Akathisia:  No  Handed:    AIMS (if indicated):     Assets:  Desire for Improvement  Sleep:  Number of Hours: 6.75   Musculoskeletal: Strength & Muscle Tone: within normal limits Gait & Station:  unsteady Patient leans: N/A  Current Medications: Current Facility-Administered Medications  Medication Dose Route Frequency Provider Last Rate Last Dose  . acetaminophen (TYLENOL) tablet 650 mg  650 mg Oral Q6H PRN Waylan Boga, NP      . alum & mag hydroxide-simeth (MAALOX/MYLANTA) 200-200-20 MG/5ML suspension 30 mL  30 mL Oral Q4H PRN Waylan Boga,  NP      . docusate sodium (COLACE) capsule 100 mg  100 mg Oral BID Nicholaus Bloom, MD   100 mg at 12/03/13 0831  . gabapentin (NEURONTIN) capsule 100 mg  100 mg Oral BID Waylan Boga, NP   100 mg at 12/03/13 0831  . hydrOXYzine (ATARAX/VISTARIL) tablet 50 mg  50 mg Oral TID PRN Waylan Boga, NP   50 mg at 12/02/13 2111  . ibuprofen (ADVIL,MOTRIN) tablet 600 mg  600 mg Oral Q6H PRN Nicholaus Bloom, MD   600 mg at 12/02/13 2112  . lamoTRIgine (LAMICTAL) tablet 225 mg  225 mg Oral Daily Waylan Boga, NP   225 mg at 12/03/13 0831  . lithium carbonate capsule 600 mg  600 mg Oral QHS Waylan Boga, NP   600 mg at 12/02/13 2110  . LORazepam (ATIVAN) tablet 1 mg  1 mg Oral Q6H PRN Nicholaus Bloom, MD   1 mg at 12/03/13 0836  . magnesium hydroxide (MILK OF MAGNESIA) suspension 30 mL  30 mL Oral Daily PRN Waylan Boga, NP   30 mL at 12/02/13 1618  . polyethylene glycol (MIRALAX / GLYCOLAX) packet 17 g  17 g Oral Daily Nicholaus Bloom, MD   17 g at 12/03/13 0831  . QUEtiapine (SEROQUEL) tablet 100 mg  100 mg Oral TID Nicholaus Bloom, MD   100 mg at 12/03/13 0831  . QUEtiapine (SEROQUEL) tablet 300 mg  300 mg Oral QHS Waylan Boga, NP   300 mg at 12/02/13 2109  . traZODone (DESYREL) tablet 100 mg  100 mg Oral QHS PRN Waylan Boga, NP   100 mg at 12/02/13 2109    Lab Results: No results found for this or any previous visit (from the past 48 hour(s)).  Physical Findings: AIMS: Facial and Oral Movements Muscles of Facial Expression: None, normal Lips and Perioral Area: None, normal Jaw: None, normal Tongue: None, normal,Extremity Movements Upper (arms, wrists, hands, fingers): None, normal Lower (legs, knees, ankles, toes): None, normal, Trunk Movements Neck, shoulders, hips: None, normal, Overall Severity Severity of abnormal movements (highest score from questions above): None, normal Incapacitation due to abnormal movements: None, normal Patient's awareness of abnormal movements (rate only patient's report):  No Awareness, Dental Status Current problems with teeth and/or dentures?: No Does patient usually wear dentures?: No  CIWA:  CIWA-Ar Total: 16 COWS:     Treatment Plan Summary: Daily contact with patient to assess and evaluate symptoms and progress in treatment Medication management  Plan: Supportive approach/coping skills/relapse prevention           Optimize treatment with psychotropics  Cont 1:1 for safety             Medical Decision Making Problem Points:  Review of psycho-social stressors (1) Data Points:  Review of new medications or change in dosage (2)  I certify that inpatient services furnished can reasonably be expected to improve the patient's condition.   Benjamine Mola, FNP-BC 12/03/2013, 2:04 PM Agree with assessment and plan Geralyn Flash A. Sabra Heck, M.D.

## 2013-12-03 NOTE — Progress Notes (Signed)
Adult Psychoeducational Group Note  Date:  12/03/2013 Time:  10:39 PM  Group Topic/Focus:  Wrap-Up Group:   The focus of this group is to help patients review their daily goal of treatment and discuss progress on daily workbooks.  Participation Level:  Active  Participation Quality:  Minimal  Affect:  Labile  Cognitive:  Lacking  Insight: Limited  Engagement in Group:  Limited  Modes of Intervention:  Education, Socialization and Support  Additional Comments:  Patient attended and participated in group tonight. She reports that she did nothing today. She did not want to go outside. She eat in her room today and spoke with the PA. She has no plans for her personal development.  Luberta Grabinski Aviva Kluver 12/03/2013, 10:39 PM

## 2013-12-03 NOTE — BHH Group Notes (Signed)
[place] LCSW Group Therapy  12/03/2013 1:15 pm  Type of Therapy: Process Group Therapy  Participation Level: Did not attend  Summary of Progress/Problems: Today's group addressed the issue of overcoming obstacles.  Patients were asked to identify their biggest obstacle post d/c that stands in the way of their on-going success, and then problem solve as to how to manage this.  [name] 12/03/2013   1:04 PM

## 2013-12-03 NOTE — Progress Notes (Signed)
D:  Per pt self inventory pt reports sleeping fair, appetite improving, energy level low, ability to pay attention improving, rates depression at a 10 out of 10 and hopelessness at a 10 out of 10.     A:  Emotional support provided, Encouraged pt to continue with treatment plan and attend all group activities, q15 min checks maintained for safety.  R:  Pt isolates in her room most of the day, when ambulating has unsteady gait, pt still on 1:1 observation for safety.

## 2013-12-03 NOTE — Clinical Social Work Note (Signed)
CSW met with pt individually to discuss aftercare plan and check in. Pt stated that she is seen by [facility] ACT team. Pt stated that when she discharges, she plans to live with her mother. CSW inquired as to where her mother lives and pt replied, "[place]." Pt stated that she does not have any reason to live and has no support from other family members. Pt refused to consent to family/collateral contact. She stated that her meds are working fairly well. Pt presents with depressed mood and flat affect. Pt denies AVH/HI and endorses passive SI.   [place], LCSWA 12/03/2013 3:01 PM

## 2013-12-03 NOTE — Progress Notes (Signed)
1:1 Note- Pt sitting up awake in bed, asked Probation officer for pencil and ginger ale, Probation officer asked pt to come to med window to get ginger ale, pt did not want to get oob to get ginger ale, pt remains in bed with sitter at bedside, denies HI, endorses SI/AH, pt unsteady when ambulating, 1:1 observation continued for safety.

## 2013-12-03 NOTE — Progress Notes (Signed)
Pt is in bed resting with eyes closed  No distress noted   Pt is on a 1:1 for safety as she has an unsteady gait and impulsive behaviors  She is presently safe

## 2013-12-03 NOTE — Progress Notes (Signed)
Pt has slept well all night   She remains a high fall risk due to unsteady gait and her behavior is unpredictable  Pt on 1:1  She remains safe at present

## 2013-12-03 NOTE — Progress Notes (Signed)
D   Pt is visible on the milieu  She is labile and continues to be medication seeking   Her insight is limited and she does not voice any goals or plans for discharge   She interacts minimally with others  She is irritable and sad  She has an unsteady gait and unpredicatble behaviors due to her irritability A  Verbal support given   Medications administered and effectivenes monitored   Pt on 1:1  Encourage pt to start making daily goals R   Pt safe at present   Pt just rolled her eyes when encouraged to make daily goals

## 2013-12-03 NOTE — Progress Notes (Signed)
1:1 Note- Pt sitting up in dayroom talking with other patients, endorses SI and AH, denies HI/VH, calm at this time, sitter with patient in dayroom, 1:1 observation continued for safety.

## 2013-12-03 NOTE — Progress Notes (Addendum)
1:1 Note- Pt sleeping in bed, eyes closed, no distress, respirations equal and unlabored, sitter at bedside, pt gait was unsteady when she got up this morning for am meds, pt sleepy/sedated this morning, contracts for safety, 1:1 observation continued for safety.

## 2013-12-04 MED ORDER — HYDROXYZINE HCL 25 MG PO TABS: 25.0000 mg | ORAL_TABLET | Freq: Three times a day (TID) | ORAL | Status: DC | PRN

## 2013-12-04 MED ORDER — LORAZEPAM 0.5 MG PO TABS
0.5000 mg | ORAL_TABLET | Freq: Three times a day (TID) | ORAL | Status: DC | PRN
Start: 2013-12-04 — End: 2013-12-11
  Administered 2013-12-04 – 2013-12-11 (×14): 0.5 mg via ORAL
  Filled 2013-12-04 (×14): qty 1

## 2013-12-04 MED ORDER — HYDROXYZINE HCL 25 MG PO TABS
25.0000 mg | ORAL_TABLET | Freq: Four times a day (QID) | ORAL | Status: DC | PRN
  Administered 2013-12-04 – 2013-12-11 (×14): 25 mg via ORAL
  Filled 2013-12-04 (×14): qty 1

## 2013-12-04 NOTE — BHH Group Notes (Signed)
[facility] LCSW Aftercare Discharge Planning Group Note   12/04/2013 10:52 AM  Participation Quality:  Minimal  Mood/Affect:  Depressed  Depression Rating:  10  Anxiety Rating:  4  Thoughts of Suicide:  Yes Will you contract for safety?   Yes  Current AVH:  No  Plan for Discharge/Comments:  "I am very depressed.  My daughter had a baby and she is making threats towards me."  My house is full of people who do not clean up after themselves and disrespect me and my home.  It was too much for me and I drank alcohol and smoked."  Went on to say that she had gone to meetings for awhile, but they did not help because people talk about others instead of being supportive.  Was chewing on ice while speaking.  Transportation Means: unk  Supports: One  Her friend Ramonita Lab

## 2013-12-04 NOTE — Progress Notes (Signed)
Patient ID: [name], female   DOB: Oct 10, 1970, 43 y.o.   MRN: [id number] 1:1 observation note: patient has been ambulating well.  She has been up in the milieu with a sitter.  Patient will evaluated in the am for continued 1:1 need.  Patient is passively suicidal; contracts for safety.  Patient c/o anxiety, given ativan 1 mg for same.

## 2013-12-04 NOTE — Progress Notes (Signed)
Pt is in bed resting with eyes closed and no distress noted   Pt is on a 1:1 and is presently safe

## 2013-12-04 NOTE — Progress Notes (Signed)
Patient ID: [name], female   DOB: 1971/03/02, 44 y.o.   MRN: [id number] [facility] MD Progress Note  12/04/2013 2:04 PM [name]  MRN:  [id number] Subjective:  Patient states "I'm not so good. I don't want to be on [place] anymore. I really mean it this time. I am so tired. I am trying to get a plan. I could slit my wrist but I don't think I can get away with that here. I hear voices telling me to run away and kill myself. I also hear shadows talking to me."   Objective:  Patient is assessed in her room today. [name] appears to be very sedated during her assessment. Review of her MAR record indicates that the patient has been asking for ativan and vistaril frequently. Patient reports ongoing symptoms of depression, suicidal thoughts, and psychosis. The patient was observed earlier walking down the hall demonstrating a steady gait. She has no clear way to harm herself in the hospital. Nursing staff report that the patient frequently requests to receive ativan and vistaril together to achieve sedation. [name] becomes irritable during the assessment as questions are asked to clarify her symptoms. She does not make any eye contact with Provider. Patient answers all questions with her eyes closed.   Diagnosis:   DSM5: Schizophrenia Disorders:  none Obsessive-Compulsive Disorders:  none Trauma-Stressor Disorders:  none Substance/Addictive Disorders:  Alcohol Related Disorder - Severe (303.90) Depressive Disorders:  Major Depressive Disorder - with Psychotic Features (296.24) Total Time spent with patient: 20 minutes  Axis I: Bipolar, Depressed  ADL's:  Intact  Sleep: Poor  Appetite:  Fair  Suicidal Ideation:  Passive SI to cut wrists  Homicidal Ideation:  Plan:  denies Intent:  denies Means:  denies AEB (as evidenced by):  Psychiatric Specialty Exam: Physical Exam  Review of Systems  Constitutional: Positive for malaise/fatigue.  HENT: Negative.   Eyes: Negative.    Respiratory: Negative.   Cardiovascular: Negative.   Gastrointestinal: Negative.   Genitourinary: Negative.   Musculoskeletal: Negative.   Skin: Negative.   Neurological: Negative.   Endo/Heme/Allergies: Negative.   Psychiatric/Behavioral: Positive for depression, suicidal ideas, hallucinations and substance abuse. Negative for memory loss. The patient is nervous/anxious and has insomnia.     Blood pressure 97/66, pulse 73, temperature 97.5 F (36.4 C), temperature source Oral, resp. rate 18, height 5\' 5"  (1.651 m), weight 72.576 kg (160 lb).Body mass index is 26.63 kg/(m^2).  General Appearance: Disheveled  Eye Contact::  Minimal  Speech:  not spontaneous  Volume:  Decreased  Mood:  Anxious, Dysphoric and Hopeless  Affect:  Restricted  Thought Process:  Disorganized and Irrelevant  Orientation:  Full (Time, Place, and Person)  Thought Content:  Hallucinations: Auditory and Rumination  Suicidal Thoughts:  Yes.  with intent/plan  Homicidal Thoughts:  Yes.  with intent/plan  Memory:  Immediate;   Fair Recent;   Fair Remote;   Fair  Judgement:  Poor  Insight:  Lacking  Psychomotor Activity:  Restlessness  Concentration:  Fair  Recall:  [employer] of Knowledge:NA  Language: Fair  Akathisia:  No  Handed:    AIMS (if indicated):     Assets:  Communication Skills Desire for Improvement Leisure Time Physical Health Resilience  Sleep:  Number of Hours: 5.75   Musculoskeletal: Strength & Muscle Tone: within normal limits Gait & Station: unsteady Patient leans: N/A  Current Medications: Current Facility-Administered Medications  Medication Dose Route Frequency Provider Last Rate Last Dose  . acetaminophen (TYLENOL) tablet 650  mg  650 mg Oral Q6H PRN Waylan Boga, NP      . alum & mag hydroxide-simeth (MAALOX/MYLANTA) 200-200-20 MG/5ML suspension 30 mL  30 mL Oral Q4H PRN Waylan Boga, NP      . docusate sodium (COLACE) capsule 100 mg  100 mg Oral BID Nicholaus Bloom, MD    100 mg at 12/04/13 0818  . gabapentin (NEURONTIN) capsule 100 mg  100 mg Oral BID Waylan Boga, NP   100 mg at 12/04/13 0818  . hydrOXYzine (ATARAX/VISTARIL) tablet 25 mg  25 mg Oral Q6H PRN Elmarie Shiley, NP      . ibuprofen (ADVIL,MOTRIN) tablet 600 mg  600 mg Oral Q6H PRN Nicholaus Bloom, MD   600 mg at 12/03/13 2059  . lamoTRIgine (LAMICTAL) tablet 225 mg  225 mg Oral Daily Waylan Boga, NP   225 mg at 12/04/13 0818  . lithium carbonate capsule 600 mg  600 mg Oral QHS Waylan Boga, NP   600 mg at 12/03/13 2059  . LORazepam (ATIVAN) tablet 0.5 mg  0.5 mg Oral Q8H PRN Elmarie Shiley, NP      . magnesium hydroxide (MILK OF MAGNESIA) suspension 30 mL  30 mL Oral Daily PRN Waylan Boga, NP   30 mL at 12/03/13 1703  . polyethylene glycol (MIRALAX / GLYCOLAX) packet 17 g  17 g Oral Daily Nicholaus Bloom, MD   17 g at 12/04/13 [phone]  . QUEtiapine (SEROQUEL) tablet 100 mg  100 mg Oral TID Nicholaus Bloom, MD   100 mg at 12/04/13 1340  . QUEtiapine (SEROQUEL) tablet 300 mg  300 mg Oral QHS Waylan Boga, NP   300 mg at 12/03/13 2059  . traZODone (DESYREL) tablet 100 mg  100 mg Oral QHS PRN Waylan Boga, NP   100 mg at 12/03/13 2058    Lab Results: No results found for this or any previous visit (from the past 48 hour(s)).  Physical Findings: AIMS: Facial and Oral Movements Muscles of Facial Expression: None, normal Lips and Perioral Area: None, normal Jaw: None, normal Tongue: None, normal,Extremity Movements Upper (arms, wrists, hands, fingers): None, normal Lower (legs, knees, ankles, toes): None, normal, Trunk Movements Neck, shoulders, hips: None, normal, Overall Severity Severity of abnormal movements (highest score from questions above): None, normal Incapacitation due to abnormal movements: None, normal Patient's awareness of abnormal movements (rate only patient's report): No Awareness, Dental Status Current problems with teeth and/or dentures?: No Does patient usually wear dentures?: No  CIWA:   CIWA-Ar Total: 16 COWS:     Treatment Plan Summary: Daily contact with patient to assess and evaluate symptoms and progress in treatment Medication management  Plan:  1. Continue crisis management and stabilization.  2. Medication management:  -Continue Seroquel 100 mg TID and 300 mg hs for psychosis -Continue Lithium 600 mg hs for improved mood stability -Decrease Ativan to 0.5 mg every eight hours prn anxiety -Decrease Vistaril to 25 mg every six hours as needed for anxiety.  -Continue Lamictal to 225 mg for mood stabilization.  -Continue Neurontin 100 mg BID for anxiety.  3. Encouraged patient to attend groups and participate in group counseling sessions and activities.  4. Discharge plan in progress.  5. Continue current treatment plan.  6. Address health issues: Vitals reviewed and stable. Repeat basic metabolic panel to evaluate for continued hypokalemia, and also Lithium level in am.  7. Discontinue 1:1 observation.   Medical Decision Making Problem Points:  Established problem, worsening (2), Review of  last therapy session (1) and Review of psycho-social stressors (1) Data Points:  Order Aims Assessment (2) Review of medication regiment & side effects (2) Review of new medications or change in dosage (2)  I certify that inpatient services furnished can reasonably be expected to improve the patient's condition.   Elmarie Shiley, NP-C 12/04/2013, 2:04 PM

## 2013-12-04 NOTE — Progress Notes (Signed)
Pt remains on 1:1 due to unsteady gait and unpredictable behaviors   She is safe at present

## 2013-12-04 NOTE — Tx Team (Signed)
  Interdisciplinary Treatment Plan Update   Date Reviewed:  12/04/2013  Time Reviewed:  8:21 AM  Progress in Treatment:   Attending groups: Yes Participating in groups: Yes Taking medication as prescribed: Yes  Tolerating medication: Yes Family/Significant other contact made: Yes  Patient understands diagnosis: No, pt demonstrating limited insight at this time. PT reports SI, AH, and is seeking medication stabilization.  Discussing patient identified problems/goals with staff: Yes Medical problems stabilized or resolved: Yes Denies suicidal/homicidal ideation: No, passive SI indicated by pt. Able to contract for safety at this time.  Patient has not harmed self or others: Yes  For review of initial/current patient goals, please see plan of care.  Estimated Length of Stay:  5-7 days   Reason for Continuation of Hospitalization: Hallucinations Medication stabilization Suicidal ideation Other; describe Mood stabilization  New Problems/Goals identified:  Pt would like to find more supportive AA group and sponsor. Pt unable to identify any other goals for d/c. Pt labile and irritable this morning. She is on 1:1 due to high fall risk.   Discharge Plan or Barriers:   Pt plans to continue with [facility] ACT team and states that she needs to find a more support home group (AA). Pt unsure about where she plans to live at d/c and is focused on plan to commit suicide at d/c in order to "live with my mom in heaven." CSW assessing.   Additional Comments: 43 Y/O female who states that got stressed out, was wanting to talk to someone to give her a reason to go on. . Has not taken her medications in 2 weeks. States when she gets alcohol, opioids,and marijuana she calms h down. But admits the voices get worst, gets more paranoid because of PTSD. States he called the people in [facility] but they did not call back. Got really drunk, smoking marijuna led to deeper depression and she 'knew" she was going  to take her life. States she called the crisis line who called the police. States the voices were "killing her" the police found her in the parking lot at [place]. Has been drinking that day. Would have been 5 months the 23 rd of being sober. States son was acting out, throwing things around, worried about her daughter who is pregnant and is living here and there. States that she had to pull out the duagher's GF as he was smoking pot at the house. Eventually her daughter gave birth and she had to allowed all in. She has multiple kids at he house.  Attendees:  Signature: [place], [place] 12/04/2013 8:21 AM   Signature: Ripley Fraise, LCSW 12/04/2013 8:21 AM  Signature: Elmarie Shiley, NP 12/04/2013 8:21 AM  Signature: Rich Number D. RN  12/04/2013 8:21 AM  Signature:  12/04/2013 8:21 AM  Signature:  12/04/2013 8:21 AM  Signature:   12/04/2013 8:21 AM  Signature:    Signature:    Signature:    Signature:    Signature:    Signature:      Scribe for Treatment Team:   [place], LCSWA  12/04/2013 8:21 AM

## 2013-12-04 NOTE — Progress Notes (Addendum)
Patient ID: [name], female   DOB: 1971/05/15, 43 y.o.   MRN: [id number] 1:1 observation initiated due patient unsteady on her feet.  Patient was ambulating well earlier today.  She is now listing to the side and stumbling on her feet.  Her 1:1 had been discontinued at 1450.  Reported to MD symptoms patient was exhibiting and order for 1:1 placed.  She denies any HI/AVH. Patient is passively suicidal; contracts for safety She states, "I don't need a 1:1.  I can walk just fine."  "I want to go to the cafeteria.  Can't I just walk down there and back?" Explained to patient that for her safety, she will remain 1:1 until she ambulates better.  Patient receptive to staff.

## 2013-12-04 NOTE — BHH Group Notes (Signed)
[facility] Group Therapy  12/04/2013 , 11:28 AM    Type of Therapy:  Mental Health Association Presentation  Participation Level:  Active  Participation Quality:  Attentive  Affect:  Blunted  Cognitive:  Oriented  Insight:  Limited  Engagement in Therapy:  Engaged  Modes of Intervention:  Discussion, Education and Socialization  Summary of Progress/Problems:  [name] from [place] came to present his recovery story and play the guitar.  Stayed for the entire group.  Was a bit disruptive by chomping on ice, and then getting up, shuffling across the room and attempting to carry 3 cups of water.  [name] 12/04/2013 , 11:28 AM

## 2013-12-04 NOTE — Progress Notes (Signed)
[place] [facility] 1:1 Observation Documentation  For the first (8) hours following discontinuation of 1:1 precautions, a progress note entry by nursing staff should be documented at least every 2 hours, reflecting the patient's behavior, condition, mood, and conversation.  Use the progress notes for additional entries.  Time 1:1 discontinued:  14:50    Patient's Behavior:  Cooperative, quiet  Patient's Condition:  WDL  Patient's Conversation:  Watching television in the group room.  [name] [name] 12/04/2013, 2:50 PM

## 2013-12-04 NOTE — Progress Notes (Signed)
Child/Adolescent Psychoeducational Group Note  Date:  12/04/2013 Time:  8:32 PM  Group Topic/Focus:  Wrap-Up Group:   The focus of this group is to help patients review their daily goal of treatment and discuss progress on daily workbooks.  Participation Level:  Active  Participation Quality:  Appropriate and Attentive  Affect:  Appropriate  Cognitive:  Appropriate  Insight:  Appropriate  Engagement in Group:  Engaged  Modes of Intervention:  Discussion  Additional Comments:  Pt attended the wrap up group this evening and remained appropriate and engaged throughout the duration of the group. Pt ranked her day as a 2 because, "that's just how she feels." Pt shared that her day was ok.  Beryle Beams 12/04/2013, 8:32 PM

## 2013-12-04 NOTE — Progress Notes (Signed)
1:1 RN note Patient is currently in group with her sitter.  Her gait is steady and she is cooperative at this time.

## 2013-12-04 NOTE — Progress Notes (Signed)
Patient ID: [name], female   DOB: 1971-03-07, 43 y.o.   MRN: [id number] 1:1 notes  12/04/2013 @ 2200  D: Patient in dayroom most of the evening sitting quietly not interacting with peers. Pt mood and affect appeared depressed and sad. Pt not very talkative with Probation officer but states "wants my medicine so I can go to bed". Pt gait is unsteady. Pt endorses passive suicidal ideation but contract for safety.  No sign of distress noted at this time A: 1:1 observation for safety. Medication administered as prescribed. Writer encouraged pt to discuss feelings.Pt in bed sleeping at the moment.  R: Patient is asleep. Sitter at bedside. 1:1 continues

## 2013-12-04 NOTE — Progress Notes (Signed)
1:1 RN note D:  Patient seen in the hallway.  She is looking a bit disheveled and sad.  She requested a lorazepam for anxiety.   A:  Lorazepam given as requested.  Encouraged patient to be out of her room more.   R:  Continues on 1:1.  Safety is maintained.

## 2013-12-04 NOTE — Progress Notes (Signed)
Pt was interviewed with NP. Agree with above assessment and plan. Will now resume 1:1, since pt was observed to have unsteady gait on the unit. Medication changes made today are an effort to decrease daytime sedation from meds. Pt reported intentionally wanting to sleep through the day.  Skip Estimable, MD

## 2013-12-05 LAB — BASIC METABOLIC PANEL
BUN: 11 mg/dL (ref 6–23)
CHLORIDE: 105 meq/L (ref 96–112)
CO2: 24 mEq/L (ref 19–32)
CREATININE: 0.89 mg/dL (ref 0.50–1.10)
Calcium: 9 mg/dL (ref 8.4–10.5)
GFR calc Af Amer: >90 mL/min (ref >90)
GFR calc non Af Amer: 78 mL/min — ABNORMAL LOW (ref >90)
Glucose, Bld: 102 mg/dL — ABNORMAL HIGH (ref 70–99)
Potassium: 4.3 mEq/L (ref 3.7–5.3)
Sodium: 138 mEq/L (ref 137–147)

## 2013-12-05 LAB — LITHIUM LEVEL: LITHIUM LVL: 0.71 meq/L — AB (ref 0.80–1.40)

## 2013-12-05 MED ORDER — SERTRALINE HCL 50 MG PO TABS
50.0000 mg | ORAL_TABLET | Freq: Every day | ORAL | Status: DC
  Administered 2013-12-05 – 2013-12-06 (×2): 50 mg via ORAL
  Filled 2013-12-05 (×5): qty 1

## 2013-12-05 NOTE — Progress Notes (Signed)
[place] [facility] 1:1 Observation Documentation  For the first (8) hours following discontinuation of 1:1 precautions, a progress note entry by nursing staff should be documented at least every 2 hours, reflecting the patient's behavior, condition, mood, and conversation.  Use the progress notes for additional entries.  Time 1:1 discontinued:  1300  Patient's Behavior:  Patient remains cooperative with staff and is attending groups on unit.  Patient's Condition:  Patient remains safe.  Patient's Conversation:  Patient states that she is "doing good."  Derrick Ravel 12/05/2013, 7:11 PM

## 2013-12-05 NOTE — Progress Notes (Signed)
[facility] MD Progress Note  12/05/2013 11:39 AM [name]  MRN:  [id number] Subjective:  [name] is stating that she the voices are somewhat better. States she will not hurt herself. She is willing to tell staff if she feels she would do it. She states she just gets overwhelmed and hopeless and thinks about killing herself. State she does not think she can handle things at home anymore. States she is tired. Does admits she quits her medications and get high to numb herself Diagnosis:   DSM5: Schizophrenia Disorders:  none Obsessive-Compulsive Disorders:  none Trauma-Stressor Disorders:  none Substance/Addictive Disorders:  Alcohol Related Disorder - Severe (303.90) Depressive Disorders:  Major Depressive Disorder - with Psychotic Features (296.24) Total Time spent with patient: 30 minutes  Axis I: Bipolar, Depressed  ADL's:  Intact  Sleep: Fair  Appetite:  Fair  Suicidal Ideation:  Plan:  denies Intent:  denies Means:  denies Homicidal Ideation:  Plan:  denies Intent:  denies Means:  denies AEB (as evidenced by):  Psychiatric Specialty Exam: Physical Exam  Review of Systems  Constitutional: Negative.   HENT: Negative.   Eyes: Negative.   Respiratory: Negative.   Cardiovascular: Negative.   Gastrointestinal: Negative.   Genitourinary: Negative.   Musculoskeletal: Negative.   Skin: Negative.   Neurological: Negative.   Endo/Heme/Allergies: Negative.   Psychiatric/Behavioral: Positive for depression, hallucinations and substance abuse. The patient is nervous/anxious.     Blood pressure 100/68, pulse 96, temperature 98.7 F (37.1 C), temperature source Oral, resp. rate 20, height $RemoveBe'5\' 5"'[id number]$  (1.651 m), weight 72.576 kg (160 lb).Body mass index is 26.63 kg/(m^2).  General Appearance: Disheveled  Eye Sport and exercise psychologist::  Fair  Speech:  Blocked, Slow and not spontaneous  Volume:  Decreased  Mood:  Anxious and Depressed  Affect:  Restricted  Thought Process:  Coherent and Goal  Directed  Orientation:  Full (Time, Place, and Person)  Thought Content:  symptoms, worries, concerns  Suicidal Thoughts:  Intermittent when she feels hopeless about her situation  Homicidal Thoughts:  No  Memory:  Immediate;   Fair Recent;   Fair Remote;   Fair  Judgement:  Fair  Insight:  Present  Psychomotor Activity:  Restlessness  Concentration:  Fair  Recall:  [employer] of Knowledge:NA  Language: Fair  Akathisia:  No  Handed:    AIMS (if indicated):     Assets:  Desire for Improvement  Sleep:  Number of Hours: 5.5   Musculoskeletal: Strength & Muscle Tone: within normal limits Gait & Station: normal Patient leans: N/A  Current Medications: Current Facility-Administered Medications  Medication Dose Route Frequency Provider Last Rate Last Dose  . acetaminophen (TYLENOL) tablet 650 mg  650 mg Oral Q6H PRN Waylan Boga, NP      . alum & mag hydroxide-simeth (MAALOX/MYLANTA) 200-200-20 MG/5ML suspension 30 mL  30 mL Oral Q4H PRN Waylan Boga, NP      . docusate sodium (COLACE) capsule 100 mg  100 mg Oral BID Nicholaus Bloom, MD   100 mg at 12/05/13 0900  . gabapentin (NEURONTIN) capsule 100 mg  100 mg Oral BID Waylan Boga, NP   100 mg at 12/05/13 0900  . hydrOXYzine (ATARAX/VISTARIL) tablet 25 mg  25 mg Oral Q6H PRN Elmarie Shiley, NP   25 mg at 12/05/13 4098  . ibuprofen (ADVIL,MOTRIN) tablet 600 mg  600 mg Oral Q6H PRN Nicholaus Bloom, MD   600 mg at 12/04/13 1503  . lamoTRIgine (LAMICTAL) tablet 225 mg  225 mg  Oral Daily Waylan Boga, NP   225 mg at 12/05/13 0900  . lithium carbonate capsule 600 mg  600 mg Oral QHS Waylan Boga, NP   600 mg at 12/04/13 2111  . LORazepam (ATIVAN) tablet 0.5 mg  0.5 mg Oral Q8H PRN Elmarie Shiley, NP   0.5 mg at 12/05/13 6734  . magnesium hydroxide (MILK OF MAGNESIA) suspension 30 mL  30 mL Oral Daily PRN Waylan Boga, NP   30 mL at 12/03/13 1703  . polyethylene glycol (MIRALAX / GLYCOLAX) packet 17 g  17 g Oral Daily Nicholaus Bloom, MD   17 g at  12/05/13 0900  . QUEtiapine (SEROQUEL) tablet 100 mg  100 mg Oral TID Nicholaus Bloom, MD   100 mg at 12/05/13 0900  . QUEtiapine (SEROQUEL) tablet 300 mg  300 mg Oral QHS Waylan Boga, NP   300 mg at 12/04/13 2112  . traZODone (DESYREL) tablet 100 mg  100 mg Oral QHS PRN Waylan Boga, NP   100 mg at 12/04/13 2111    Lab Results:  Results for orders placed during the hospital encounter of 11/29/13 (from the past 48 hour(s))  BASIC METABOLIC PANEL     Status: Abnormal   Collection Time    12/05/13  6:18 AM      Result Value Ref Range   Sodium 138  137 - 147 mEq/L   Potassium 4.3  3.7 - 5.3 mEq/L   Chloride 105  96 - 112 mEq/L   CO2 24  19 - 32 mEq/L   Glucose, Bld 102 (*) 70 - 99 mg/dL   BUN 11  6 - 23 mg/dL   Creatinine, Ser 0.89  0.50 - 1.10 mg/dL   Calcium 9.0  8.4 - 10.5 mg/dL   GFR calc non Af Amer 78 (*) >90 mL/min   GFR calc Af Amer >90  >90 mL/min   Comment: (NOTE)     The eGFR has been calculated using the CKD EPI equation.     This calculation has not been validated in all clinical situations.     eGFR's persistently <90 mL/min signify possible Chronic Kidney     Disease.     Performed at [facility] LEVEL     Status: Abnormal   Collection Time    12/05/13  6:18 AM      Result Value Ref Range   Lithium Lvl 0.71 (*) 0.80 - 1.40 mEq/L   Comment: Performed at [facility]    Physical Findings: AIMS: Facial and Oral Movements Muscles of Facial Expression: None, normal Lips and Perioral Area: None, normal Jaw: None, normal Tongue: None, normal,Extremity Movements Upper (arms, wrists, hands, fingers): None, normal Lower (legs, knees, ankles, toes): None, normal, Trunk Movements Neck, shoulders, hips: None, normal, Overall Severity Severity of abnormal movements (highest score from questions above): None, normal Incapacitation due to abnormal movements: None, normal Patient's awareness of abnormal movements (rate only  patient's report): No Awareness, Dental Status Current problems with teeth and/or dentures?: No Does patient usually wear dentures?: No  CIWA:  CIWA-Ar Total: 16 COWS:     Treatment Plan Summary: Daily contact with patient to assess and evaluate symptoms and progress in treatment Medication management  Plan: Supportive approach/coping skills/relapse prevention           Optimize response to the psychotropics            Will D/D the 1:1 OBS as she is contracting for safety.  She wants to be given another                  Chance            Will resume the Zoloft at a lower dose than the one she was taking            Referral to ADACT  Medical Decision Making Problem Points:  Review of psycho-social stressors (1) Data Points:  Review of medication regiment & side effects (2)  I certify that inpatient services furnished can reasonably be expected to improve the patient's condition.   Nicholaus Bloom 12/05/2013, 11:39 AM

## 2013-12-05 NOTE — Progress Notes (Signed)
1:1 notes  12/05/2013 @ 0200 D: Patient in bed sleeping. Respiration regular and unlabored. No sign of distress noted at this time A: 1:1 observation for safety R: Patient remains asleep. Sitter at bedside. 1:1 continues

## 2013-12-05 NOTE — Progress Notes (Signed)
[facility] [place] [facility] 1:1 Observation Documentation  For the first (8) hours following discontinuation of 1:1 precautions, a progress note entry by nursing staff should be documented at least every 2 hours, reflecting the patient's behavior, condition, mood, and conversation.  Use the progress notes for additional entries.  Time 1:1 discontinued:  1300  Patient's Behavior:  Patient remains cooperative on unit and is attending groups.  Patient's Condition:  Patient remains safe.  Patient's Conversation: Patient states that she is "fine" and "happy to not have someone with her all day."   Derrick Ravel 12/05/2013, 7:11 PM

## 2013-12-05 NOTE — BHH Suicide Risk Assessment (Signed)
[place] INPATIENT:  Family/Significant Other Suicide Prevention Education  Suicide Prevention Education:  Patient Refusal for Family/Significant Other Suicide Prevention Education: The patient [name] has refused to provide written consent for family/significant other to be provided Family/Significant Other Suicide Prevention Education during admission and/or prior to discharge.  Physician notified.  [place] 12/05/2013, 10:49 AM

## 2013-12-05 NOTE — Progress Notes (Signed)
[facility] - 0900  The focus of this group is to educate the patient on the purpose and policies of crisis stabilization and provide a format to answer questions about their admission.  The group details unit policies and expectations of patients while admitted.

## 2013-12-05 NOTE — Progress Notes (Signed)
[facility] 1:1 Observation Documentation  For the first (8) hours following discontinuation of 1:1 precautions, a progress note entry by nursing staff should be documented at least every 2 hours, reflecting the patient's behavior, condition, mood, and conversation.  Use the progress notes for additional entries.  Time 1:1 discontinued:  1300  Patient's Behavior:  Calm and cooperative  Patient's Condition:  Pt laughing enjoying karaoke. Pt is safe  Patient's Conversation:  No needs at this time  Mal Misty 12/05/2013, 9:04 PM

## 2013-12-05 NOTE — [facility] Group Notes (Signed)
[facility] Group Notes:  (Counselor/Nursing/MHT/Case Management/Adjunct)  12/05/2013 1:15PM  Type of Therapy:  Group Therapy  Participation Level:  Active  Participation Quality:  Appropriate  Affect:  Flat  Cognitive:  Oriented  Insight:  Improving  Engagement in Group:  Limited  Engagement in Therapy:  Limited  Modes of Intervention:  Discussion, Exploration and Socialization  Summary of Progress/Problems: The topic for group was balance in life.  Pt participated in the discussion about when their life was in balance and out of balance and how this feels.  Pt discussed ways to get back in balance and short term goals they can work on to get where they want to be.  Initially was reluctant to participate, but eventually decided to join in.  Stated she is unbalanced, and knows this because she has been experiencing increased anger and depression, and "laughter has left my life."   Talked about a joyful time she had last summer when she was in a paddleboat with a book in [place].  She is hopeful that she can get back to that mental state again, which is a huge improvement from where she was 2 days ago when she was talking about joining her mother in 65.    [name] 12/05/2013 1:09 PM

## 2013-12-05 NOTE — Progress Notes (Signed)
1:1 Note  Patient currently in room resting with MHT present. Patient denies any SI/HI but admits to "hearing voices sometimes." Patient states that she feels "steady on her feet" and is requesting to be off the 1:1. The patient has been ambulating independently in the hallway and remains safe at this time. Will continue to monitor patient for safety.

## 2013-12-05 NOTE — Progress Notes (Signed)
[facility] 1:1 Observation Documentation  For the first (8) hours following discontinuation of 1:1 precautions, a progress note entry by nursing staff should be documented at least every 2 hours, reflecting the patient's behavior, condition, mood, and conversation.  Use the progress notes for additional entries.  Time 1:1 discontinued:  1300  Patient's Behavior:  Patient seems depressed but verbally agrees to contract for safety. Patient is interacting more within the milieu and attending some groups.  Patient's Condition:  Patient remains safe on unit.  Patient's Conversation:  Patient states that she is "happy to be off the 1:1". Patient states jokingly "I needed that divorce."  Derrick Ravel 12/05/2013, 1:06 PM

## 2013-12-05 NOTE — Progress Notes (Signed)
1:1 notes  12/05/2013 @ 0600 D: Patient up brushing her teeth. Pt reports she had a good night sleep. Pt denies any pain. No sign of distress noted at this time A: 1:1 observation for safety R: Patient is safe. Sitter at bedside. 1:1 continues

## 2013-12-05 NOTE — Progress Notes (Signed)
[facility] 1:1 Observation Documentation  For the first (8) hours following discontinuation of 1:1 precautions, a progress note entry by nursing staff should be documented at least every 2 hours, reflecting the patient's behavior, condition, mood, and conversation.  Use the progress notes for additional entries.  Time 1:1 discontinued:  1300  Patient's Behavior:  Patient remains cooperative with staff. Patient is actively participating within the milieu and was observed talking and interacting appropriately with other patients.  Patient's Condition:  Patient remains safe.  Patient's Conversation:  Patient has no concerns at this time and states that she is "fine."  Derrick Ravel 12/05/2013, 7:12 PM

## 2013-12-06 DIAGNOSIS — F333 Major depressive disorder, recurrent, severe with psychotic symptoms: Secondary | ICD-10-CM

## 2013-12-06 MED ORDER — SERTRALINE HCL 100 MG PO TABS
100.0000 mg | ORAL_TABLET | Freq: Every day | ORAL | Status: DC
  Administered 2013-12-07 – 2013-12-11 (×5): 100 mg via ORAL
  Filled 2013-12-06: qty 1
  Filled 2013-12-06: qty 3
  Filled 2013-12-06: qty 1
  Filled 2013-12-06: qty 3
  Filled 2013-12-06 (×5): qty 1

## 2013-12-06 MED ORDER — QUETIAPINE FUMARATE 400 MG PO TABS
400.0000 mg | ORAL_TABLET | Freq: Every day | ORAL | Status: DC
  Administered 2013-12-06 – 2013-12-10 (×5): 400 mg via ORAL
  Filled 2013-12-06 (×2): qty 1
  Filled 2013-12-06: qty 3
  Filled 2013-12-06: qty 1
  Filled 2013-12-06: qty 3
  Filled 2013-12-06 (×3): qty 1

## 2013-12-06 NOTE — Progress Notes (Signed)
See 1:1 flow sheet on MHT paper chart

## 2013-12-06 NOTE — Progress Notes (Signed)
Patient ID: [name], female   DOB: 08-27-1970, 43 y.o.   MRN: [id number] 1-1 Monitoring Note. D. Patient found in her room by staff attempting to cut her wrists with screw she obtained from the window. She was also trying to use broken plastic fork. Pt states '' I hate myself right now. I'm miserable and I don't want to be here. I guess it's really hard to kill yourself, if I come across something else I will try it again. I guess I need to find some broken glass or a razor or something. '' A. 1.1 sitter ordered for safety. Lyda Jester RN, Solicitor and Abagail Kitchens from [employer] notified. Screw given to [place] to return to window. R. Patient is currently monitoring 1.1. As ordered. Pt is safe. Will continue to monitor 1.1. As ordered.

## 2013-12-06 NOTE — Progress Notes (Signed)
Adult Psychoeducational Group Note  Date:  12/06/2013 Time:  10:00AM  Group Topic/Focus:  Relapse Prevention Planning:   The focus of this group is to define relapse and discuss the need for planning to combat relapse.  Participation Level:  Did Not Attend  Additional Comments:  Patient opted not to attend the group session.   Ancil Linsey 12/06/2013, 12:36 PM

## 2013-12-06 NOTE — Progress Notes (Signed)
Patient ID: [name], female   DOB: July 08, 1971, 43 y.o.   MRN: [id number] [facility] MD Progress Note  12/06/2013 2:15 PM [name]  MRN:  [id number] Subjective:  She reports she is still feeling depressed, and describes ongoing hallucinations Objective: I saw patient along with Ms. Rosana Hoes, NP, and have also discussed case with Nursing Staff. Patient remains depressed and symptomatic. Of concern, patient shared with [place] that she had superficially scratched her wrist ( no bleeding,,  superficial marks only) with a plastic fork and with a screw that she removed from the window.  Patient also describes ongoing psychotic symptoms- describes vague hallucinations which are critical/ demeaning. No clear command hallucinations at this time. As discussed with staff she has tended to remain isolated , keeps to self, and minimal milieu interaction. She did, however, meet with ACT team provider for 40 minutes, and was cooperative with session. She is denying medication side effects.    Diagnosis:   Substance/Addictive Disorders:  Alcohol Related Disorder - Severe (303.90) Depressive Disorders:  Major Depressive Disorder - with Psychotic Features (296.24) Total Time spent with patient: 30 minutes    ADL's:  fair  Sleep:  Good   Appetite:  Fair  Suicidal Ideation:  As noted, active suicidal thoughts and gesture by scratching wrists with items she found .  Homicidal Ideation:  Denies  AEB (as evidenced by):  Psychiatric Specialty Exam: Physical Exam  Review of Systems  Constitutional: Negative.   HENT: Negative.   Eyes: Negative.   Respiratory: Negative.   Cardiovascular: Negative.   Gastrointestinal: Negative.   Genitourinary: Negative.   Musculoskeletal: Negative.   Skin: Negative.   Neurological: Negative.   Endo/Heme/Allergies: Negative.   Psychiatric/Behavioral: Positive for depression, suicidal ideas, hallucinations and substance abuse.    Blood pressure 103/70, pulse 72,  temperature 97.9 F (36.6 C), temperature source Oral, resp. rate 18, height _0  (1.651 m), weight 72.576 kg (160 lb).Body mass index is 26.63 kg/(m^2).  General Appearance: Disheveled  Eye Sport and exercise psychologist::  Fair  Speech: slow, but coherent   Volume:  Decreased  Mood:   Depressed   Affect:  Restricted, depressed  Thought Process: linear, no thought disorder noted  Orientation:  Alert and oriented  Thought Content: (+) SI, ruminative  Suicidal Thoughts: (+) SI, at this time unable to reliably contract for safety  Homicidal Thoughts:  No  Memory:  Immediate;   Fair Recent;   Fair Remote;   Fair  Judgement:  Fair  Insight:  Present  Psychomotor Activity:  normal  Concentration:  Fair  Recall:  [employer] of Knowledge:NA  Language: Fair  Akathisia:  No  Handed:    AIMS (if indicated):     Assets:  Desire for Improvement  Sleep:  Number of Hours: 6.5   Musculoskeletal: Strength & Muscle Tone: within normal limits Gait & Station: normal Patient leans: N/A  Current Medications: Current Facility-Administered Medications  Medication Dose Route Frequency Provider Last Rate Last Dose  . acetaminophen (TYLENOL) tablet 650 mg  650 mg Oral Q6H PRN Waylan Boga, NP      . alum & mag hydroxide-simeth (MAALOX/MYLANTA) 200-200-20 MG/5ML suspension 30 mL  30 mL Oral Q4H PRN Waylan Boga, NP      . docusate sodium (COLACE) capsule 100 mg  100 mg Oral BID Nicholaus Bloom, MD   100 mg at 12/06/13 0809  . gabapentin (NEURONTIN) capsule 100 mg  100 mg Oral BID Waylan Boga, NP   100 mg at 12/06/13 0809  .  hydrOXYzine (ATARAX/VISTARIL) tablet 25 mg  25 mg Oral Q6H PRN Elmarie Shiley, NP   25 mg at 12/06/13 0811  . ibuprofen (ADVIL,MOTRIN) tablet 600 mg  600 mg Oral Q6H PRN Nicholaus Bloom, MD   600 mg at 12/04/13 1503  . lamoTRIgine (LAMICTAL) tablet 225 mg  225 mg Oral Daily Waylan Boga, NP   225 mg at 12/06/13 0809  . lithium carbonate capsule 600 mg  600 mg Oral QHS Waylan Boga, NP   600 mg at 12/05/13  2156  . LORazepam (ATIVAN) tablet 0.5 mg  0.5 mg Oral Q8H PRN Elmarie Shiley, NP   0.5 mg at 12/06/13 0811  . magnesium hydroxide (MILK OF MAGNESIA) suspension 30 mL  30 mL Oral Daily PRN Waylan Boga, NP   30 mL at 12/03/13 1703  . polyethylene glycol (MIRALAX / GLYCOLAX) packet 17 g  17 g Oral Daily Nicholaus Bloom, MD   17 g at 12/06/13 [phone]  . QUEtiapine (SEROQUEL) tablet 100 mg  100 mg Oral TID Nicholaus Bloom, MD   100 mg at 12/06/13 1155  . QUEtiapine (SEROQUEL) tablet 300 mg  300 mg Oral QHS Waylan Boga, NP   300 mg at 12/05/13 2155  . sertraline (ZOLOFT) tablet 50 mg  50 mg Oral Daily Nicholaus Bloom, MD   50 mg at 12/06/13 0809  . traZODone (DESYREL) tablet 100 mg  100 mg Oral QHS PRN Waylan Boga, NP   100 mg at 12/05/13 2157    Lab Results:  Results for orders placed during the hospital encounter of 11/29/13 (from the past 48 hour(s))  BASIC METABOLIC PANEL     Status: Abnormal   Collection Time    12/05/13  6:18 AM      Result Value Ref Range   Sodium 138  137 - 147 mEq/L   Potassium 4.3  3.7 - 5.3 mEq/L   Chloride 105  96 - 112 mEq/L   CO2 24  19 - 32 mEq/L   Glucose, Bld 102 (*) 70 - 99 mg/dL   BUN 11  6 - 23 mg/dL   Creatinine, Ser 0.89  0.50 - 1.10 mg/dL   Calcium 9.0  8.4 - 10.5 mg/dL   GFR calc non Af Amer 78 (*) >90 mL/min   GFR calc Af Amer >90  >90 mL/min   Comment: (NOTE)     The eGFR has been calculated using the CKD EPI equation.     This calculation has not been validated in all clinical situations.     eGFR's persistently <90 mL/min signify possible Chronic Kidney     Disease.     Performed at [facility] LEVEL     Status: Abnormal   Collection Time    12/05/13  6:18 AM      Result Value Ref Range   Lithium Lvl 0.71 (*) 0.80 - 1.40 mEq/L   Comment: Performed at [facility]    Physical Findings: At this time patient is not presenting with symptoms of ETOH WDL- no clear tremors or diaphoresis and stable vitals-  see above  CIWA:  COWS:     Treatment Plan Summary: Daily contact with patient to assess and evaluate symptoms and progress in treatment Medication management Patient states that Zoloft has been helpful at higher doses. Will increase ZOLOFT to 100 mgrs QDAY  Plan: Supportive approach/coping skills/relapse prevention           Optimize response to the  psychotropics         RESUME 1:1  OBSERVATION            Referral to [facility] Increase ZOLOFT to 100 mgrs QAM  Medical Decision Making Problem Points:  Established problem, worsening (2) and Review of psycho-social stressors (1) Data Points:  Review of medication regiment & side effects (2)  I certify that inpatient services furnished can reasonably be expected to improve the patient's condition.   Neita Garnet, MD Psychiatrist 12/06/2013 2:32 PM

## 2013-12-06 NOTE — BHH Group Notes (Signed)
[facility] LCSW Aftercare Discharge Planning Group Note   12/06/2013 10:49 AM  Participation Quality:  Did not attend    Trish Mage

## 2013-12-06 NOTE — Progress Notes (Signed)
[facility] Group Notes:  (Nursing/MHT/Case Management/Adjunct)  Date:  12/06/2013  Time:  11:26 PM  Type of Therapy:  Group Therapy  Participation Level:  Minimal  Participation Quality:  Appropriate  Affect:  Blunted and Depressed  Cognitive:  Lacking  Insight:  Improving  Engagement in Group:  Developing/Improving  Modes of Intervention:  Socialization and Support  Summary of Progress/Problems: Pt. Stated she felt depressed today. Pt. Stated she wanted to get in a physical altercation and have someone hurt her.  Pt. Was off topic from group discussion and was lacking form developing on topic of relapse prevention.  Staff reassured her support and encourage to develop coping skills.  Lanell Persons 12/06/2013, 11:26 PM

## 2013-12-06 NOTE — Progress Notes (Signed)
Patient ID: [name], female   DOB: [birth year], 43 y.o.   MRN: [id number] 1-1 Monitoring Note. D. Pt remains on 1.1. Observation for safety/self injurious behaviors. [name] continues to be unable to contract for safety, and is noted to be attention seeking on the unit. A. 1.1. Observation remains in place for safety. R. Patient is safe. Will continue to monitor as ordered.

## 2013-12-06 NOTE — Progress Notes (Signed)
Patient ID: [name], female   DOB: [birth year], 43 y.o.   MRN: [id number] D. Pt was observed from staff to be vomiting in bathroom. Staff suspect that patient induced this episode on her own as she refused to turn on the light when redirected by staff. When writer attempted to assess patient she states '' I don't want to hear your voice right now. Leave me alone '' Writer offered maalox and ginger ale and she refused. Will continue to monitor 1.1 as ordered.

## 2013-12-06 NOTE — Progress Notes (Signed)
D: Pt states she is doing well. Pt is interacting more with peers.  Pt reports she did not sleep well last night. Pt attended karaoke and was supportive of peers. Pt denies SI/HI/VH. Cooperative with assessment. No acute distressed noted at this time.  A: Met with pt 1:1. Medications administered as prescribed. Writer encouraged pt to discuss feelings. Pt encouraged to come to staff with any question or concerns.  R: Patient remains safe. She is complaint with medications and denies any adverse reaction. Continue current POC.

## 2013-12-06 NOTE — BHH Group Notes (Signed)
[facility] LCSW Group Therapy  12/06/2013  1:05 PM  Type of Therapy:  Group therapy  Participation Level:  Active  Participation Quality:  Attentive  Affect:  Flat  Cognitive:  Oriented  Insight:  Limited  Engagement in Therapy:  Limited  Modes of Intervention:  Discussion, Socialization  Summary of Progress/Problems:  Chaplain was here to lead a group on themes of hope and courage.  Paolina was sitting sideways on the loveseat, and folks were standing, and I asked her if someone could sit by here.  She consented, but then would not sit forward in a normal manner.  When I asked her again, she walked off in a huff and did not return.  [name] 12/06/2013 11:13 AM

## 2013-12-06 NOTE — Progress Notes (Signed)
Patient ID: [name], female   DOB: 08/11/1970, 42 y.o.   MRN: [id number] 1-1 Monitoring Note. D. Pt remains on 1.1. Observation for safety/self injurious behaviors. [name] continues to be unable to contract for safety, and is noted to be attention seeking on the unit. A. 1.1. Observation remains in place for safety. R. Patient is safe. Will continue to monitor as ordered.   

## 2013-12-07 NOTE — Progress Notes (Signed)
Patient ID: [name], female   DOB: [birth year], 43 y.o.   MRN: [id number] Psychoeducational Group Note  Date:  12/07/2013 Time:  0930  Group Topic/Focus:  healthy coping skills.   Participation Level: Did Not Attend  Participation Quality:  Not Applicable  Affect:  Not Applicable  Cognitive:  Not Applicable  Insight:  Not Applicable  Engagement in Group: Not Applicable  Additional Comments:  Did not attend.   Pricilla Larsson 12/07/2013, 9:42 AM

## 2013-12-07 NOTE — Progress Notes (Signed)
Patient ID: [name], female   DOB: Dec 30, 1970, 43 y.o.   MRN: [id number] 1-1 Monitoring Note. D. Pt remains on 1.1. Observation for safety/self injurious behaviors. [name] continues to be unable to contract for safety, and requires 1.1. Observation at this time.. A. 1.1. Observation remains in place for safety. R. Patient is safe. Will continue to monitor as ordered.

## 2013-12-07 NOTE — Progress Notes (Signed)
[facility] MD Progress Note  12/07/2013 1:35 PM [name]  MRN:  [id number] Subjective: "I am tired of being watched and no privacy"   Objective: Evaluated patient this afternoon who is angry and want to be taken off 1:1.  Patient stated she want to join peers in the dinning to eat.  She did not see any thing wrong in throwing her water pitcher full of ice on the floor.  Patient voiced anger towards everybody in the unit.  She reported lack of privacy to, shower or talk on the phone.  Patient reported poor sleep last night and stated she was tossing all night.  She reported her appetite is fair but could be better if allowed to eat with other.  Patient is hearing voices telling her to kill herself and others.  She is seeing"a lot of stuff I cannot make out"   Patient was informed by Probation officer that if she want to be off 1:1 monitoring she will have to learn coping strategies  and be able to safely stay with her peers.  Patient stated she is compliant with her medication but refuses to be in groups.  Total Time spent with patient: 20 minutes  Axis I: Alcohol Abuse and Major Depression, Recurrent severe Axis II: Deferred Axis III:  Past Medical History  Diagnosis Date  . Hypertension   . Leg pain   . Substance abuse   . Bipolar 1 disorder   . Depression   . Psychosis   . Carpal tunnel syndrome    Axis IV: economic problems, educational problems, housing problems, occupational problems, other psychosocial or environmental problems and problems related to social environment Axis V: 41-50 serious symptoms  ADL's:  Impaired  Sleep: Fair  Appetite:  Fair  Suicidal Ideation:  Plan:  none Intent:  yes Means:  unknown Homicidal Ideation:  Plan:  unknown Intent:  yes especially people that bothers her Means:  Unknown AEB (as evidenced by):  Psychiatric Specialty Exam: Physical Exam  ROS  Blood pressure 92/62, pulse 86, temperature 98.5 F (36.9 C), temperature source Oral, resp. rate  16, height 5\' 5"  (1.651 m), weight 72.576 kg (160 lb).Body mass index is 26.63 kg/(m^2).  General Appearance: Casual and Disheveled  Eye Contact::  Poor  Speech:  Clear and Coherent  Volume:  Normal  Mood:  Angry, Depressed and Irritable  Affect:  Congruent, Depressed, Flat, Inappropriate and Labile  Thought Process:  Coherent  Orientation:  Full (Time, Place, and Person)  Thought Content:  Hallucinations: Auditory Command:  Kill self and kill annoying people Visual  Suicidal Thoughts:  Yes.  without intent/plan  Homicidal Thoughts:  Yes.  without intent/plan  Memory:  Immediate;   Good Recent;   Fair Remote;   Fair  Judgement:  Poor  Insight:  Shallow  Psychomotor Activity:  Normal  Concentration:  Fair  Recall:  NA  Fund of Knowledge:Good  Language: Fair  Akathisia:  NA  Handed:  Right  AIMS (if indicated):     Assets:  Desire for Improvement  Sleep:  Number of Hours: 5.5   Musculoskeletal: Strength & Muscle Tone: within normal limits Gait & Station: normal Patient leans: N/A  Current Medications: Current Facility-Administered Medications  Medication Dose Route Frequency Provider Last Rate Last Dose  . acetaminophen (TYLENOL) tablet 650 mg  650 mg Oral Q6H PRN Waylan Boga, NP      . alum & mag hydroxide-simeth (MAALOX/MYLANTA) 200-200-20 MG/5ML suspension 30 mL  30 mL Oral Q4H PRN Waylan Boga,  NP      . docusate sodium (COLACE) capsule 100 mg  100 mg Oral BID Nicholaus Bloom, MD   100 mg at 12/07/13 1884  . gabapentin (NEURONTIN) capsule 100 mg  100 mg Oral BID Waylan Boga, NP   100 mg at 12/07/13 0814  . hydrOXYzine (ATARAX/VISTARIL) tablet 25 mg  25 mg Oral Q6H PRN Elmarie Shiley, NP   25 mg at 12/07/13 0815  . ibuprofen (ADVIL,MOTRIN) tablet 600 mg  600 mg Oral Q6H PRN Nicholaus Bloom, MD   600 mg at 12/04/13 1503  . lamoTRIgine (LAMICTAL) tablet 225 mg  225 mg Oral Daily Waylan Boga, NP   225 mg at 12/07/13 0814  . lithium carbonate capsule 600 mg  600 mg Oral QHS  Waylan Boga, NP   600 mg at 12/06/13 2138  . LORazepam (ATIVAN) tablet 0.5 mg  0.5 mg Oral Q8H PRN Elmarie Shiley, NP   0.5 mg at 12/07/13 0815  . magnesium hydroxide (MILK OF MAGNESIA) suspension 30 mL  30 mL Oral Daily PRN Waylan Boga, NP   30 mL at 12/03/13 1703  . polyethylene glycol (MIRALAX / GLYCOLAX) packet 17 g  17 g Oral Daily Nicholaus Bloom, MD   17 g at 12/07/13 [phone]  . QUEtiapine (SEROQUEL) tablet 100 mg  100 mg Oral TID Nicholaus Bloom, MD   100 mg at 12/07/13 1328  . QUEtiapine (SEROQUEL) tablet 400 mg  400 mg Oral QHS Neita Garnet, MD   400 mg at 12/06/13 2138  . sertraline (ZOLOFT) tablet 100 mg  100 mg Oral Daily Neita Garnet, MD   100 mg at 12/07/13 6301  . traZODone (DESYREL) tablet 100 mg  100 mg Oral QHS PRN Waylan Boga, NP   100 mg at 12/06/13 2138    Lab Results: No results found for this or any previous visit (from the past 48 hour(s)).  Physical Findings: AIMS: Facial and Oral Movements Muscles of Facial Expression: None, normal Lips and Perioral Area: None, normal Jaw: None, normal Tongue: None, normal,Extremity Movements Upper (arms, wrists, hands, fingers): None, normal Lower (legs, knees, ankles, toes): None, normal, Trunk Movements Neck, shoulders, hips: None, normal, Overall Severity Severity of abnormal movements (highest score from questions above): None, normal Incapacitation due to abnormal movements: None, normal Patient's awareness of abnormal movements (rate only patient's report): No Awareness, Dental Status Current problems with teeth and/or dentures?: No Does patient usually wear dentures?: No  CIWA:  CIWA-Ar Total: 16 COWS:     Treatment Plan Summary: Consult with Dr Jaclyn Shaggy on rounds Daily contact with patient to assess and evaluate symptoms and progress in treatment Medication management  Plan: Plan: Continue with plan of care Continue crisis management Encourage to participate in group and individual sessions Continue medication  management/ and review as needed Continue taking Gabapentin 100 mg po bid for mood and pain Continue taking Seroquel 100 mg tid, 400 mg qhs for mood/psychosis Zoloft 100 mg po daily for depression Trazodone 100 mg po qhs as needed for sleep Lamotrigine 225 mg po daily for mood and depression Lithium 600 mg po qhs for mood stabilization Discharge  Plan in progress Address health issues /V/S as needed    Medical Decision Making Problem Points:  Established problem, stable/improving (1) Data Points:  Review and summation of old records (2)  I certify that inpatient services furnished can reasonably be expected to improve the patient's condition.   Delfin Gant   PMHNP-[place] [facility] 12/07/2013, 1:35 PM

## 2013-12-07 NOTE — Progress Notes (Signed)
Patient ID: [name], female   DOB: [birth year], 43 y.o.   MRN: [id number] 1-1 Monitoring Note. D. Pt remains on 1.1. Observation for safety/self injurious behaviors. [name] continues to be unable to contract for safety, and is noted to be attention seeking on the unit, again today. A. 1.1. Observation remains in place for safety. R. Patient is safe. Will continue to monitor as ordered.

## 2013-12-07 NOTE — Progress Notes (Signed)
[facility] Group Notes:  (Nursing/MHT/Case Management/Adjunct)  Date:  12/07/2013  Time:  9:00 PM  Type of Therapy:  Psychoeducational Skills  Participation Level:  Minimal  Participation Quality:  Inattentive  Affect:  Angry  Cognitive:  Lacking  Insight:  Lacking  Engagement in Group:  Limited  Modes of Intervention:  Education  Summary of Progress/Problems: The patient stated in group this evening that she had a bad day overall ("shitty day"). The patient would not go into further detail about her day. Interestingly enough, she was able to recognize that talking to her peers did help her to cope with the situation. The patient was unable to state a coping skill (theme of the day).   Gennette Pac 12/07/2013, 9:00 PM

## 2013-12-07 NOTE — Progress Notes (Signed)
Pt resting in bed speaking with MHT at bed side. Pt remains irritable and refuses to answer assessment questions with this Probation officer. "I just want my damn meds." Pt does not appear to respond to internal stimuli and no harmful gestures observed to self or others. Pt medicated per orders and attempts were made to offer support however pt unreceptive. 1:1 obs continues for safety and pt is safe. Junius Creamer Tommi Rumps

## 2013-12-07 NOTE — Progress Notes (Addendum)
Patient ID: [name], female   DOB: 1971/03/29, 43 y.o.   MRN: [id number] 1-1 Monitoring Note. D. Pt presents with depressed mood, affect irritable. Patient very irritable upon approach this am with Probation officer, when Probation officer attempted to assess patient asking her how her mood she states '' I wrote it down. You can read it I'm not talking to you '' She continues to report suicidal ideation writing on her self inventory '' I'm not sure I'd rather die '' to take better care of herself. She rates her sleep as poor, and depression and hopelessness at 9/10 on depression scale 10 being worst. She reports : '' I just want to be alone so I can carry my death '' Pt later agitated when trying to use the phone and staff present monitoring as ordered. She threw her pitcher full of ice across the hallway. A. Pt continues to require 1.1. Observation for safety. Medications given as ordered, including prn medications for agitation and anxiety. Discussed above information with Dr. Scherrie Merritts and [name] NP. R. Patient is currently safe. Will continue to monitor 1.1 as ordered.
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
## 2013-12-07 NOTE — Progress Notes (Signed)
Patient ID: [name], female   DOB: Feb 10, 1971, 43 y.o.   MRN: [id number] Psychoeducational Group Note  Date:  12/07/2013 Time:  0910  Group Topic/Focus:  inventory group   Participation Level: Did Not Attend  Participation Quality:  Not Applicable  Affect:  Not Applicable  Cognitive:  Not Applicable  Insight:  Not Applicable  Engagement in Group: Not Applicable  Additional Comments:  Did not attend.   Pricilla Larsson 12/07/2013, 9:42 AM

## 2013-12-08 DIAGNOSIS — R45850 Homicidal ideations: Secondary | ICD-10-CM

## 2013-12-08 DIAGNOSIS — F332 Major depressive disorder, recurrent severe without psychotic features: Secondary | ICD-10-CM

## 2013-12-08 DIAGNOSIS — F1010 Alcohol abuse, uncomplicated: Secondary | ICD-10-CM

## 2013-12-08 NOTE — Progress Notes (Signed)
Patient ID: [name], female   DOB: 16-Nov-1970, 43 y.o.   MRN: [id number] Psychoeducational Group Note  Date:  12/08/2013 Time:  0930am  Group Topic/Focus:  Making Healthy Choices:   The focus of this group is to help patients identify negative/unhealthy choices they were using prior to admission and identify positive/healthier coping strategies to replace them upon discharge.  Participation Level:  Minimal  Participation Quality:  Inattentive  Affect:  Labile and Resistant  Cognitive:  Lacking  Insight:  Lacking and Resistant  Engagement in Group:  Lacking and Resistant  Additional Comments:  Healthy support systems.   Pricilla Larsson 12/08/2013,2:46 PM

## 2013-12-08 NOTE — Progress Notes (Signed)
Patient ID: [name], female   DOB: 27-Jun-1971, 43 y.o.   MRN: [id number] [facility] MD Progress Note  12/08/2013 5:05 PM [name]  MRN:  [id number] Subjective:   She reports she is still feeling depressed, and mood is angry today. She has poor insight.  Objective:     Diagnosis:   Substance/Addictive Disorders:  Alcohol Related Disorder - Severe (303.90) Depressive Disorders:  Major Depressive Disorder - with Psychotic Features (296.24) Total Time spent with patient: 30 minutes    ADL's:  fair  Sleep:  Good   Appetite:  Fair  Suicidal Ideation:  As noted, active suicidal thoughts  Homicidal Ideation:  Denies  AEB (as evidenced by):  Psychiatric Specialty Exam: Physical Exam  Review of Systems  Constitutional: Negative.   HENT: Negative.   Eyes: Negative.   Respiratory: Negative.   Cardiovascular: Negative.   Gastrointestinal: Negative.   Genitourinary: Negative.   Musculoskeletal: Negative.   Skin: Negative.   Neurological: Negative.   Endo/Heme/Allergies: Negative.   Psychiatric/Behavioral: Positive for depression, suicidal ideas, hallucinations and substance abuse.    Blood pressure 110/87, pulse 87, temperature 98 F (36.7 C), temperature source Oral, resp. rate 18, height 5\' 5"  (1.651 m), weight 72.576 kg (160 lb).Body mass index is 26.63 kg/(m^2).  General Appearance: Disheveled  Eye Sport and exercise psychologist::  Fair  Speech: slow, but coherent   Volume:  Decreased  Mood:   Depressed   Affect:  Restricted, depressed  Thought Process: linear, no thought disorder noted  Orientation:  Alert and oriented  Thought Content: (+) SI, ruminative  Suicidal Thoughts: (+) SI,   Homicidal Thoughts:  No  Memory:  Immediate;   Fair Recent;   Fair Remote;   Fair  Judgement:  Fair  Insight:  Present  Psychomotor Activity:  normal  Concentration:  Fair  Recall:  [employer] of Knowledge:NA  Language: Fair  Akathisia:  No  Handed:    AIMS (if indicated):     Assets:   Desire for Improvement  Sleep:  Number of Hours: 5.25   Musculoskeletal: Strength & Muscle Tone: within normal limits Gait & Station: normal Patient leans: N/A  Current Medications: Current Facility-Administered Medications  Medication Dose Route Frequency Provider Last Rate Last Dose  . acetaminophen (TYLENOL) tablet 650 mg  650 mg Oral Q6H PRN Waylan Boga, NP      . alum & mag hydroxide-simeth (MAALOX/MYLANTA) 200-200-20 MG/5ML suspension 30 mL  30 mL Oral Q4H PRN Waylan Boga, NP      . docusate sodium (COLACE) capsule 100 mg  100 mg Oral BID Nicholaus Bloom, MD   100 mg at 12/08/13 0817  . gabapentin (NEURONTIN) capsule 100 mg  100 mg Oral BID Waylan Boga, NP   100 mg at 12/08/13 0818  . hydrOXYzine (ATARAX/VISTARIL) tablet 25 mg  25 mg Oral Q6H PRN Elmarie Shiley, NP   25 mg at 12/08/13 0817  . ibuprofen (ADVIL,MOTRIN) tablet 600 mg  600 mg Oral Q6H PRN Nicholaus Bloom, MD   600 mg at 12/04/13 1503  . lamoTRIgine (LAMICTAL) tablet 225 mg  225 mg Oral Daily Waylan Boga, NP   225 mg at 12/08/13 0817  . lithium carbonate capsule 600 mg  600 mg Oral QHS Waylan Boga, NP   600 mg at 12/07/13 2127  . LORazepam (ATIVAN) tablet 0.5 mg  0.5 mg Oral Q8H PRN Elmarie Shiley, NP   0.5 mg at 12/08/13 0817  . magnesium hydroxide (MILK OF MAGNESIA) suspension 30 mL  30  mL Oral Daily PRN Waylan Boga, NP   30 mL at 12/03/13 1703  . polyethylene glycol (MIRALAX / GLYCOLAX) packet 17 g  17 g Oral Daily Nicholaus Bloom, MD   17 g at 12/08/13 0831  . QUEtiapine (SEROQUEL) tablet 100 mg  100 mg Oral TID Nicholaus Bloom, MD   100 mg at 12/08/13 1209  . QUEtiapine (SEROQUEL) tablet 400 mg  400 mg Oral QHS Neita Garnet, MD   400 mg at 12/07/13 2127  . sertraline (ZOLOFT) tablet 100 mg  100 mg Oral Daily Neita Garnet, MD   100 mg at 12/08/13 0818  . traZODone (DESYREL) tablet 100 mg  100 mg Oral QHS PRN Waylan Boga, NP   100 mg at 12/06/13 2138    Lab Results:  No results found for this or any previous visit (from  the past 48 hour(s)).  Physical Findings: At this time patient is not presenting with symptoms of ETOH WDL- no clear tremors or diaphoresis and stable vitals- see above  CIWA:  COWS:     Treatment Plan Summary: Daily contact with patient to assess and evaluate symptoms and progress in treatment Medication management Patient states that Zoloft has been helpful at higher doses. Will increase ZOLOFT to 100 mgrs QDAY  Plan: Supportive approach/coping skills/relapse prevention             Continue current meds  Medical Decision Making Problem Points:  Established problem, worsening (2) and Review of psycho-social stressors (1) Data Points:  Review of medication regiment & side effects (2)  I certify that inpatient services furnished can reasonably be expected to improve the patient's condition.

## 2013-12-08 NOTE — Progress Notes (Signed)
Patient ID: [name], female   DOB: 1970/08/17, 43 y.o.   MRN: [id number] Psychoeducational Group Note  Date:  12/08/2013 Time:  0910am  Group Topic/Focus:  Making Healthy Choices:   The focus of this group is to help patients identify negative/unhealthy choices they were using prior to admission and identify positive/healthier coping strategies to replace them upon discharge.  Participation Level:  Minimal  Participation Quality:  Inattentive  Affect:  Labile  Cognitive:  Lacking  Insight:  Lacking  Engagement in Group:  Lacking  Additional Comments:  Inventory group   [name] 12/08/2013,2:45 PM

## 2013-12-08 NOTE — Progress Notes (Signed)
Patient ID: [name], female   DOB: 08/11/1970, 42 y.o.   MRN: [id number] 1-1 Monitoring Note. D. Pt remains on 1.1. Observation for safety/self injurious behaviors. [name] continues to be unable to contract for safety, and requires 1.1. Monitoring for her safety.  A. 1.1. Observation remains in place for safety. R. Patient is safe. Currently she is resting in bed quietly. Will continue to monitor as ordered.    

## 2013-12-08 NOTE — Progress Notes (Signed)
Pt observed resting in bed with eyes closed. No acute distress. RR WNL, even and unlabored. Level I obs in place for safety and pt is safe. Junius Creamer Tommi Rumps

## 2013-12-08 NOTE — Progress Notes (Signed)
Pt continues to rest without difficulty. No distress noted. RR WNL, even and unlabored. Level I obs in place for patient's safety and pt remains safe. Junius Creamer Tommi Rumps

## 2013-12-08 NOTE — Progress Notes (Signed)
Spoke with pt 1:1 who was slightly more engaged than on previous occasions. Continues to maintain she had a "crappy day" and is "angry and can't get rid of this feeling." When asked why her day was poor pt presented her wrists to this Probation officer and said, "well, I did this." Asked patient what she thinks she needs in order to avoid those choices and behaviors and pt stated, "staying in hospitals." Medicated per orders and given prn's to aid in sleep as patient indicated to this writer this morning her sleep was "awful." After medication admin pt was observed in the dayroom laughing, socializing and joking with peers. She continues to endorse passive SI and cannot contract for safety. Endorses AH but states she can't really make out what they are saying. Denies HI/VH. Pt safely resting in bed at this time with 1:1 at bedside. Will continue to monitor closely on 1:1 observation. Junius Creamer Tommi Rumps

## 2013-12-08 NOTE — Progress Notes (Signed)
Patient ID: [name], female   DOB: [birth year], 43 y.o.   MRN: [id number] 1-1 Monitoring Note. D. Pt remains on 1.1. Observation for safety/self injurious behaviors. [name] continues to be unable to contract for safety, and requires 1.1. Monitoring for her safety.  A. 1.1. Observation remains in place for safety. R. Patient is safe. Currently she is resting in bed quietly. Will continue to monitor as ordered.

## 2013-12-08 NOTE — Progress Notes (Signed)
Patient ID: [name], female   DOB: 04/05/1971, 43 y.o.   MRN: [id number] 1-1 Monitoring Note. D. Pt presents with depressed mood, affect irritable again today. [name] remains very irritable upon approach this am, refusing to answer writers assessment questions again today. She continues to be very attention seeking, resistant to care and difficult to redirect at times. When writer asked if there was anything else patient needed, she responded '' to kill me '' She was later observed during group to be cutting herself with plastic sleeve from roll up crayons in the dayroom under a blanket. She was redirected from staff, and object removed. Noted superificial lacerations to left wrist. She was offered bandaid and neosporin ointment but refused. She continues to be unable to contract for safety, and is difficult to redirect.  A. Pt continues to require 1.1. Observation for safety. Medications given as ordered, including prn medications for agitation and anxiety. Discussed above information with Dr. Scherrie Merritts.. R. Patient is currently safe. Will continue to monitor 1.1 as ordered.

## 2013-12-08 NOTE — Progress Notes (Signed)
[facility] Group Notes:  (Nursing/MHT/Case Management/Adjunct)  Date:  12/08/2013  Time:  9:55 PM  Type of Therapy:  Psychoeducational Skills  Participation Level:  Minimal  Participation Quality:  Resistant  Affect:  Irritable  Cognitive:  Lacking  Insight:  None  Engagement in Group:  None  Modes of Intervention:  Education  Summary of Progress/Problems: The patient had nothing to share in group except to say that she had a bad day. As a theme for the day, her support system will consist of [facility]. Following group however, the patient socialized openly with her peers in the dayroom, joked around, and was very outgoing.   Gennette Pac 12/08/2013, 9:55 PM

## 2013-12-08 NOTE — BHH Group Notes (Signed)
[facility] Group Notes:  (Clinical Social Work)  12/08/2013   11:15am-12:00pm  Summary of Progress/Problems:  The main focus of today's process group was to listen to a variety of genres of music and to identify that different types of music provoke different responses.  The patient then was able to identify personally what was soothing for them, as well as energizing.  Handouts were used to record feelings evoked, as well as how patient can personally use this knowledge in sleep habits, with depression, and with other symptoms.  The patient expressed understanding of concepts, as well as knowledge of how each type of music affected him/her and how this can be used at home as a wellness/recovery tool.  At the beginning of group when CSW was handing out worksheet, CSW observed patient cutting her right wrist and attempted to investigated.  Patient was resistant and hid the object she was cutting herself with, but it was eventually found under her blanket.  There was blood present on her wrist and patient was angry at CSW's insistence she had been cutting herself, denied it continually.  She was irritable at the beginning of the music, but by the third song was singing and dancing.  Type of Therapy:  Music Therapy   Participation Level:  Active  Participation Quality:  Attentive and Sharing  Affect:  Blunted  Cognitive:  Oriented  Insight:  Improving  Engagement in Therapy:  Improving  Modes of Intervention:   Activity, Exploration  Selmer Dominion, LCSW 12/08/2013, 12:30pm

## 2013-12-08 NOTE — Progress Notes (Signed)
Patient ID: [name], female   DOB: [birth year], 43 y.o.   MRN: [id number] 1-1 Monitoring Note. D. Pt remains on 1.1. Observation for safety/self injurious behaviors. [name] continues to be unable to contract for safety, reporting ''kill me '' when staff approached this morning. She continues to require 1.1. Observation as she shows no improvement in her mood/thinking or behaviors. A. 1.1. Observation remains in place for safety. Discussed above information with Dr. Scherrie Merritts. Orders maintained to monitor 1.1. R. Patient is safe. Will continue to monitor as ordered.

## 2013-12-09 DIAGNOSIS — F1020 Alcohol dependence, uncomplicated: Secondary | ICD-10-CM

## 2013-12-09 MED ORDER — GABAPENTIN 100 MG PO CAPS
200.0000 mg | ORAL_CAPSULE | Freq: Two times a day (BID) | ORAL | Status: DC
  Administered 2013-12-09 – 2013-12-11 (×4): 200 mg via ORAL
  Filled 2013-12-09: qty 2
  Filled 2013-12-09: qty 12
  Filled 2013-12-09 (×3): qty 2
  Filled 2013-12-09: qty 12
  Filled 2013-12-09: qty 2
  Filled 2013-12-09 (×2): qty 12
  Filled 2013-12-09: qty 2

## 2013-12-09 MED ORDER — MAGNESIUM CITRATE PO SOLN
1.0000 | Freq: Once | ORAL | Status: AC
  Administered 2013-12-09: 1 via ORAL

## 2013-12-09 NOTE — Progress Notes (Signed)
The focus of this group is to help patients review their daily goal of treatment and discuss progress on daily workbooks. Pt attended the evening group session and responded to all discussion prompts from the [facility]. Pt shared that today was a good day on the unit, the highlight of which was the company of her peers. "I've been smiling and laughing and almost forgetting about my own problems. I'm actually feeling hopeful today." Pt's only additional request from Nursing Staff this evening was for gowns and socks, which were given to her following group. Pt's affect was appropriate.

## 2013-12-09 NOTE — BHH Group Notes (Signed)
[facility] LCSW Group Therapy  12/09/2013 1:15 pm  Type of Therapy: Process Group Therapy  Participation Level:  Active  Participation Quality:  Appropriate  Affect:  Flat  Cognitive:  Oriented  Insight:  Improving  Engagement in Group:  Limited  Engagement in Therapy:  Limited  Modes of Intervention:  Activity, Clarification, Education, Problem-solving and Support  Summary of Progress/Problems: Today's group addressed the issue of overcoming obstacles.  Patients were asked to identify their biggest obstacle post d/c that stands in the way of their on-going success, and then problem solve as to how to manage this.  Jadae talked at length about her home situation.  Her daughter, who has a month old child, is living in the home, and she allowed her boyfriend to stay with her.  He is "selfish, ungrateful, disrespectful and does not pull his weight."  However, her daughter has threatened to move out with the baby if Chanelle tells him he cannot stay with them.  All feedback that others gave her was met by "yes, but" so the situation will remain the same.  I summarized for others, saying that it was a very difficult situation, and all we could do would be to support her while here, and continue to send positive thoughts and prayers her way.  [name] 12/09/2013   1:02 PM

## 2013-12-09 NOTE — BHH Group Notes (Signed)
[facility] LCSW Aftercare Discharge Planning Group Note   12/09/2013 10:52 AM  Participation Quality:  Engaged  Mood/Affect:  Gamey  Depression Rating:  10  Anxiety Rating:  10  Thoughts of Suicide:  Yes Will you contract for safety?   No Which is why she is on a 1:1  Current AVH:  Yes  Plan for Discharge/Comments:  Mylani states she spoke with her son over the weekend.  This was the one positive thing she was able to identify.  Beyond that, she is c/o multiple symptoms, states she is getting worse instead of better, and wonders about getting into [facility] from here.  Told her she would see the Dr today to talk about medication for symptoms and that I would call [facility].  She shared that [facility] is working with her; they have just started.  Ms Jeanett Schlein is the worker that came to see her.  Transportation Means: unk  Supports: ACT team   Trish Mage

## 2013-12-09 NOTE — Progress Notes (Signed)
D   Pt is appropriate and pleasant   She is logical and coherent   She does endorse suicidal ideation, hopelessness and poor self esteem   She interacts well with others and attends groups and active in same A   Pt on 1:1 for safety  Verbal support given  Medications administered and effectiveness monitored   R   Pt safe at present

## 2013-12-09 NOTE — Progress Notes (Signed)
Patient ID: Lucilla Lame, female   DOB: 1970/10/31, 43 y.o.   MRN: [id number] 1-1 Monitoring Note. D. Pt presents with depressed mood, affect irritable again today. Oluwatomisin remains very irritable upon approach this am, and continues to refuse to answer some questions. She remains very resistant to care. Shantae continues to make statements about cutting and hurting herself. She also wrote on her self inventory sheet today '' I don't know I want to kill myself when no ones watching. Why won't you let me take my life, I'm nothing to waste time on me. '' She also continues to refuse her meals if there is anything missing she requested. She states '' I didn't get any breakfast because they brought me oatmeal and I hate oatmeal. I had to throw it away because I want coke and noone brought me any. A. Pt continues to require 1.1. Observation for safety. Medications given as ordered, including prn medications for agitation and anxiety. Discussed above information with Dr. Darleene Cleaver. R. Patient is currently safe. Will continue to monitor 1.1 as ordered.

## 2013-12-09 NOTE — Tx Team (Signed)
  Interdisciplinary Treatment Plan Update   Date Reviewed:  12/09/2013  Time Reviewed:  8:32 AM  Progress in Treatment:   Attending groups: Sporadically Participating in groups: Minimally Taking medication as prescribed: Yes  Tolerating medication: Yes Family/Significant other contact made: No  Patient understands diagnosis: Yes  Discussing patient identified problems/goals with staff: Yes Medical problems stabilized or resolved: Yes Denies suicidal/homicidal ideation: No  But contracts for safety Patient has not harmed self or others: Yes  For review of initial/current patient goals, please see plan of care.  Estimated Length of Stay:  3-4 days  Reason for Continuation of Hospitalization: Depression Hallucinations Medication stabilization Suicidal ideation  New Problems/Goals identified:  N/A  Discharge Plan or Barriers:   Pt hopes to get into [facility]; referral has been sent.  Plan B is to return home.  Additional Comments: "I am still feeling suicidal and depressed but I don't want to be on 1;1 I need my privacy."   [name] continues to report feeling depressed, hearing voices telling her to hurt herself and feeling paranoid. Patient states that she thinks people are going to poison her.  She is now requesting to be referred to a dual diagnosis program when she is discharged. Patient is emotionally labile and has difficulty getting along with her peers and staffs.     Attendees:  Signature: Corena Pilgrim, MD 12/09/2013 8:32 AM   Signature: Ripley Fraise, LCSW 12/09/2013 8:32 AM  Signature: Elmarie Shiley, NP 12/09/2013 8:32 AM  Signature: Mayra Neer, RN 12/09/2013 8:32 AM  Signature: Darrol Angel, RN 12/09/2013 8:32 AM  Signature:  12/09/2013 8:32 AM  Signature:   12/09/2013 8:32 AM  Signature:    Signature:    Signature:    Signature:    Signature:    Signature:      Scribe for Treatment Team:   Ripley Fraise, LCSW  12/09/2013 8:32 AM

## 2013-12-09 NOTE — Progress Notes (Signed)
Pt continues to rest with eyes closed. RR WNL, even and unlabored. Level I obs in place for safety and pt remains safe. Junius Creamer Tommi Rumps

## 2013-12-09 NOTE — Progress Notes (Addendum)
Patient ID: [name], female   DOB: 1971-01-25, 43 y.o.   MRN: [id number]  1:1 Note:   Patient is seen in the dayroom speaking on the phone. Patient's affect is flat and patient is looking down to the ground. MHT was present at patient's side. No distress noted with this patient at this time. Will continue to monitor.    (Delay in note due to a situation with another patient.)

## 2013-12-09 NOTE — Progress Notes (Signed)
Patient ID: [name], female   DOB: [birth year], 43 y.o.   MRN: [id number]  1:1 Nursing Note:  Patient is seen sitting in the dayroom during afternoon group with MHT present. Patient has flat affect and is facing down with eyes fixed on the floor. Patient is not speaking. Writer unable to speak with patient at this time due to patient being in group. Patient remains safe on the unit. Writer will continue to monitor.

## 2013-12-09 NOTE — Progress Notes (Signed)
Pt was up at 0100 insisting she be allowed to shower. Pt did complete shower and is now resting in bed with eyes closed. RR WNL, even and unlabored. Level I obs in place for safety and pt is safe.Junius Creamer Tommi Rumps

## 2013-12-09 NOTE — Progress Notes (Signed)
Patient ID: [name], female   DOB: 05-Jul-1971, 43 y.o.   MRN: [id number] [facility] MD Progress Note  12/09/2013 11:15 AM [name]  MRN:  [id number] Subjective: "I still feeling suicidal and depressed but I don't want to be on 1;1 I need my privacy."   Objective:  Patient is seen and chart is reviewed. She continues to report feeling depressed, hearing voices telling her to hurt herself and feeling paranoid. Patient states that she thinks people are going to poison her. She also reports being non-compliant with her medication and was drinking beers and using drugs prior to her admission. She is now requesting to be referred to a dual diagnosis program when she is discharged. Patient is emotionally labile and has difficulty getting along with her peers and staffs.   Total Time spent with patient: 20 minutes  Axis I: Alcohol Abuse. Bipolar 1 disorder current episode depressed  Axis II: Deferred Axis III:  Past Medical History  Diagnosis Date  . Hypertension   . Leg pain   . Substance abuse   . Bipolar 1 disorder   . Depression   . Psychosis   . Carpal tunnel syndrome    Axis IV: economic problems, educational problems, housing problems, occupational problems, other psychosocial or environmental problems and problems related to social environment Axis V: 41-50 serious symptoms  ADL's:  Impaired  Sleep: Fair  Appetite:  Fair  Suicidal Ideation:  Plan:  none Intent:  yes Means:  unknown Homicidal Ideation:  Plan:  unknown Intent:  yes especially people that bothers her Means:  Unknown AEB (as evidenced by):  Psychiatric Specialty Exam: Physical Exam  ROS  Blood pressure 124/86, pulse 90, temperature 98 F (36.7 C), temperature source Oral, resp. rate 16, height 5\' 5"  (1.651 m), weight 72.576 kg (160 lb).Body mass index is 26.63 kg/(m^2).  General Appearance: Casual and Disheveled  Eye Contact::  Poor  Speech:  Clear and Coherent  Volume:  Normal  Mood:  Angry,  Depressed and Irritable  Affect:  Congruent, Depressed, Flat, Inappropriate and Labile  Thought Process:  Coherent  Orientation:  Full (Time, Place, and Person)  Thought Content:  Hallucinations: Auditory Command:  Kill self and kill annoying people Visual  Suicidal Thoughts:  Yes.  without intent/plan  Homicidal Thoughts:  Yes.  without intent/plan  Memory:  Immediate;   Good Recent;   Fair Remote;   Fair  Judgement:  Poor  Insight:  Shallow  Psychomotor Activity:  Normal  Concentration:  Fair  Recall:  NA  Fund of Knowledge:Good  Language: Fair  Akathisia:  NA  Handed:  Right  AIMS (if indicated):     Assets:  Desire for Improvement  Sleep:  Number of Hours: 6   Musculoskeletal: Strength & Muscle Tone: within normal limits Gait & Station: normal Patient leans: N/A  Current Medications: Current Facility-Administered Medications  Medication Dose Route Frequency Provider Last Rate Last Dose  . acetaminophen (TYLENOL) tablet 650 mg  650 mg Oral Q6H PRN Waylan Boga, NP      . alum & mag hydroxide-simeth (MAALOX/MYLANTA) 200-200-20 MG/5ML suspension 30 mL  30 mL Oral Q4H PRN Waylan Boga, NP      . docusate sodium (COLACE) capsule 100 mg  100 mg Oral BID Nicholaus Bloom, MD   100 mg at 12/09/13 0750  . gabapentin (NEURONTIN) capsule 100 mg  100 mg Oral BID Waylan Boga, NP   100 mg at 12/09/13 0750  . hydrOXYzine (ATARAX/VISTARIL) tablet 25 mg  25 mg Oral Q6H PRN Elmarie Shiley, NP   25 mg at 12/09/13 0750  . ibuprofen (ADVIL,MOTRIN) tablet 600 mg  600 mg Oral Q6H PRN Nicholaus Bloom, MD   600 mg at 12/04/13 1503  . lamoTRIgine (LAMICTAL) tablet 225 mg  225 mg Oral Daily Waylan Boga, NP   225 mg at 12/09/13 0750  . lithium carbonate capsule 600 mg  600 mg Oral QHS Waylan Boga, NP   600 mg at 12/08/13 2113  . LORazepam (ATIVAN) tablet 0.5 mg  0.5 mg Oral Q8H PRN Elmarie Shiley, NP   0.5 mg at 12/09/13 0750  . magnesium hydroxide (MILK OF MAGNESIA) suspension 30 mL  30 mL Oral Daily PRN  Waylan Boga, NP   30 mL at 12/03/13 1703  . polyethylene glycol (MIRALAX / GLYCOLAX) packet 17 g  17 g Oral Daily Nicholaus Bloom, MD   17 g at 12/09/13 [phone]  . QUEtiapine (SEROQUEL) tablet 100 mg  100 mg Oral TID Nicholaus Bloom, MD   100 mg at 12/09/13 0750  . QUEtiapine (SEROQUEL) tablet 400 mg  400 mg Oral QHS Neita Garnet, MD   400 mg at 12/08/13 2113  . sertraline (ZOLOFT) tablet 100 mg  100 mg Oral Daily Neita Garnet, MD   100 mg at 12/09/13 0750  . traZODone (DESYREL) tablet 100 mg  100 mg Oral QHS PRN Waylan Boga, NP   100 mg at 12/08/13 2113    Lab Results: No results found for this or any previous visit (from the past 48 hour(s)).  Physical Findings: AIMS: Facial and Oral Movements Muscles of Facial Expression: None, normal Lips and Perioral Area: None, normal Jaw: None, normal Tongue: None, normal,Extremity Movements Upper (arms, wrists, hands, fingers): None, normal Lower (legs, knees, ankles, toes): None, normal, Trunk Movements Neck, shoulders, hips: None, normal, Overall Severity Severity of abnormal movements (highest score from questions above): None, normal Incapacitation due to abnormal movements: None, normal Patient's awareness of abnormal movements (rate only patient's report): No Awareness, Dental Status Current problems with teeth and/or dentures?: No Does patient usually wear dentures?: No  CIWA:  CIWA-Ar Total: 16 COWS:     Treatment Plan Summary:  Daily contact with patient to assess and evaluate symptoms and progress in treatment Medication management  Plan: Plan: Continue with plan of care Continue crisis management Encourage to participate in group and individual sessions Continue medication management/ and review as needed Increase Gabapentin to 200 mg po bid for mood and pain Continue taking Seroquel  400 mg qhs for mood/psychosis Zoloft 100 mg po daily for depression Trazodone 100 mg po qhs as needed for sleep Lamotrigine 225 mg po daily for  mood and depression Lithium 600 mg po qhs for mood stabilization Discharge  Plan in progress Address health issues /V/S as needed    Medical Decision Making Problem Points:  Established problem, stable/improving (1) Data Points:  Review and summation of old records (2)  I certify that inpatient services furnished can reasonably be expected to improve the patient's condition.   Corena Pilgrim, MD 12/09/2013, 11:15 AM

## 2013-12-09 NOTE — Progress Notes (Signed)
Patient ID: [name], female   DOB: 04-Sep-1970, 43 y.o.   MRN: [id number] 1-1 Monitoring Note. D. Pt remains on 1.1. Observation for safety/self injurious behaviors. Jaton continues to be unable to contract for safety, continuing to make statements about death and suicide and self injurious behaviors. A. 1.1. Observation remains in place for safety. R. Patient is safe. Currently she is resting in bed quietly. Will continue to monitor as ordered.

## 2013-12-10 NOTE — Progress Notes (Signed)
Patient ID: [name], female   DOB: 15-Aug-1970, 43 y.o.   MRN: [id number] [facility] MD Progress Note  12/10/2013 11:28 AM [name]  MRN:  [id number] Subjective: "I am feeling less depressed or suicidal today. I hope I will be able to go to [facility] from here.''   Objective:  Patient is seen and chart is reviewed. Patient reports decreased auditory hallucinations, paranoia and depressive symptoms. She continues to verbalize intermittent suicidal thoughts with no specific plan. Patient denies cravings for alcohol or drugs today. She has been attending the unit milieu and compliant with her medications with no adverse reactions reported.   Total Time spent with patient: 20 minutes  Axis I: Alcohol Abuse. Bipolar 1 disorder current episode depressed  Axis II: Deferred Axis III:  Past Medical History  Diagnosis Date  . Hypertension   . Leg pain   . Carpal tunnel syndrome    Axis IV: economic problems, educational problems, housing problems, occupational problems, other psychosocial or environmental problems and problems related to social environment Axis V: 41-50 serious symptoms  ADL's:  Impaired  Sleep: Fair  Appetite:  Fair  Suicidal Ideation:  Plan:  none Intent:  yes Means:  unknown Homicidal Ideation:  Plan:  unknown Intent:  yes especially people that bothers her Means:  Unknown AEB (as evidenced by):  Psychiatric Specialty Exam: Physical Exam  ROS  Blood pressure 111/77, pulse 92, temperature 98.8 F (37.1 C), temperature source Oral, resp. rate 17, height 5\' 5"  (1.651 m), weight 72.576 kg (160 lb).Body mass index is 26.63 kg/(m^2).  General Appearance: Casual   Eye Contact::  Poor  Speech:  Clear and Coherent  Volume:  Normal  Mood:  Angry, Depressed and Irritable  Affect:  Congruent, Depressed, Flat, Inappropriate and Labile  Thought Process:  Coherent  Orientation:  Full (Time, Place, and Person)  Thought Content:  Hallucinations: Auditory Command:  Kill  self and kill annoying people Visual  Suicidal Thoughts:  Yes.  without intent/plan  Homicidal Thoughts:  Yes.  without intent/plan  Memory:  Immediate;   Good Recent;   Fair Remote;   Fair  Judgement:  Poor  Insight:  Shallow  Psychomotor Activity:  Normal  Concentration:  Fair  Recall:  NA  Fund of Knowledge:Good  Language: Fair  Akathisia:  NA  Handed:  Right  AIMS (if indicated):     Assets:  Desire for Improvement  Sleep:  Number of Hours: 6   Musculoskeletal: Strength & Muscle Tone: within normal limits Gait & Station: normal Patient leans: N/A  Current Medications: Current Facility-Administered Medications  Medication Dose Route Frequency Provider Last Rate Last Dose  . acetaminophen (TYLENOL) tablet 650 mg  650 mg Oral Q6H PRN Waylan Boga, NP      . alum & mag hydroxide-simeth (MAALOX/MYLANTA) 200-200-20 MG/5ML suspension 30 mL  30 mL Oral Q4H PRN Waylan Boga, NP      . docusate sodium (COLACE) capsule 100 mg  100 mg Oral BID Nicholaus Bloom, MD   100 mg at 12/10/13 0754  . gabapentin (NEURONTIN) capsule 200 mg  200 mg Oral BID Aalayah Riles   200 mg at 12/10/13 0754  . hydrOXYzine (ATARAX/VISTARIL) tablet 25 mg  25 mg Oral Q6H PRN Elmarie Shiley, NP   25 mg at 12/10/13 0757  . ibuprofen (ADVIL,MOTRIN) tablet 600 mg  600 mg Oral Q6H PRN Nicholaus Bloom, MD   600 mg at 12/04/13 1503  . lamoTRIgine (LAMICTAL) tablet 225 mg  225 mg Oral  Daily Waylan Boga, NP   225 mg at 12/10/13 0754  . lithium carbonate capsule 600 mg  600 mg Oral QHS Waylan Boga, NP   600 mg at 12/09/13 2132  . LORazepam (ATIVAN) tablet 0.5 mg  0.5 mg Oral Q8H PRN Elmarie Shiley, NP   0.5 mg at 12/10/13 0757  . magnesium hydroxide (MILK OF MAGNESIA) suspension 30 mL  30 mL Oral Daily PRN Waylan Boga, NP   30 mL at 12/03/13 1703  . polyethylene glycol (MIRALAX / GLYCOLAX) packet 17 g  17 g Oral Daily Nicholaus Bloom, MD   17 g at 12/10/13 0754  . QUEtiapine (SEROQUEL) tablet 400 mg  400 mg Oral QHS Neita Garnet, MD   400 mg at 12/09/13 2132  . sertraline (ZOLOFT) tablet 100 mg  100 mg Oral Daily Neita Garnet, MD   100 mg at 12/10/13 0754  . traZODone (DESYREL) tablet 100 mg  100 mg Oral QHS PRN Waylan Boga, NP   100 mg at 12/09/13 2132    Lab Results: No results found for this or any previous visit (from the past 48 hour(s)).  Physical Findings: AIMS: Facial and Oral Movements Muscles of Facial Expression: None, normal Lips and Perioral Area: None, normal Jaw: None, normal Tongue: None, normal,Extremity Movements Upper (arms, wrists, hands, fingers): None, normal Lower (legs, knees, ankles, toes): None, normal, Trunk Movements Neck, shoulders, hips: None, normal, Overall Severity Severity of abnormal movements (highest score from questions above): None, normal Incapacitation due to abnormal movements: None, normal Patient's awareness of abnormal movements (rate only patient's report): No Awareness, Dental Status Current problems with teeth and/or dentures?: No Does patient usually wear dentures?: No  CIWA:  CIWA-Ar Total: 16 COWS:     Treatment Plan Summary:  Daily contact with patient to assess and evaluate symptoms and progress in treatment Medication management  Plan: Plan: Continue with plan of care Continue crisis management Encourage to participate in group and individual sessions Continue medication management/ and review as needed ContinueGabapentin to 200 mg po bid for mood and pain Continue taking Seroquel  400 mg qhs for mood/psychosis Continue Zoloft 100 mg po daily for depression Trazodone 100 mg po qhs as needed for sleep Lamotrigine 225 mg po daily for mood and depression Lithium 600 mg po qhs for mood stabilization Discharge  Plan in progress Address health issues /V/S as needed    Medical Decision Making Problem Points:  Established problem, stable/improving (1) Data Points:  Review and summation of old records (2)  I certify that inpatient services  furnished can reasonably be expected to improve the patient's condition.   Corena Pilgrim, MD 12/10/2013, 11:28 AM

## 2013-12-10 NOTE — Progress Notes (Signed)
RN 1:1 NOTE  D: Patient denies HI and A/V hallucinations; patient reports that she requested medication for sleep and that she slept well; reports appetite is improving; reports energy level is low ; reports ability to pay attention is poor; rates depression as 9/10; rates hopelessness 9/10; reports a lot of anxiety; patient reports constant thoughts of SI and " I hope to kill myself and I hate waking up, I should be dead"; patient reported " why is my life so f##ked up"  A: Monitored q 15 minutes; patient encouraged to attend groups; patient educated about medications; patient given medications per physician orders; patient encouraged to express feelings and/or concerns; continue 1:1 observation  R: Patient is cooperative and and animated after calming down this morning; patient pleasant but has a negative outlook; patient's interaction with staff and peers is appropriate; patient was able to set goal to talk with staff 1:1 when having feelings of SI; patient is taking medications as prescribed and tolerating medications; patient is attending all groups

## 2013-12-10 NOTE — Progress Notes (Signed)
Pt in bed resting with eyes closed   Not distress noted   Pt remains on 1:1   Safe at present

## 2013-12-10 NOTE — Progress Notes (Signed)
Adult Psychoeducational Group Note  Date:  12/10/2013 Time:  0900 am Group Topic/Focus:  Wellness Toolbox:   The focus of this group is to discuss various aspects of wellness, balancing those aspects and exploring ways to increase the ability to experience wellness.  Patients will create a wellness toolbox for use upon discharge.  Participation Level:  Active  Participation Quality:  Appropriate  Affect:  Appropriate  Cognitive:  Appropriate  Insight: Appropriate  Engagement in Group:  Engaged  Modes of Intervention:  Education  Additional Comments:   Marissa Calamity 12/10/2013, 6:08 PM

## 2013-12-10 NOTE — Progress Notes (Signed)
Pt slept all night without incident   She is on a 1:1 due to self injurious behaviors and unpredictability   She is safe at present

## 2013-12-10 NOTE — Progress Notes (Signed)
Pt has been appropriate and pleasant  She has been in the dayroom interacting with others   She continues to complain of constipation and not getting enough medications   She is very focused on her medications and asks for as much as she can get as often as possible   Pt is on a 1:1 for safety and self harm behaviors   Verbal support given  Educate on medications and side effects   Administered medication and monitor effectiveness    Pt safe at present

## 2013-12-10 NOTE — Progress Notes (Signed)
The focus of this group is to educate the patient on the purpose and policies of crisis stabilization and provide a format to answer questions about their admission.  The group details unit policies and expectations of patients while admitted. Patient attended this group. 

## 2013-12-10 NOTE — [facility] Group Notes (Signed)
[place] LCSW Group Therapy  12/10/2013 , 12:27 PM   Type of Therapy:  Group Therapy  Participation Level:  Active  Participation Quality:  Attentive  Affect:  Appropriate  Cognitive:  Alert  Insight:  Improving  Engagement in Therapy:  Engaged  Modes of Intervention:  Discussion, Exploration and Socialization  Summary of Progress/Problems: Today's group focused on the term Diagnosis.  Participants were asked to define the term, and then pronounce whether it is a negative, positive or neutral term.  Per usual, there was some gaminess involved with [name] today.  However, half way through the group the other patient she was conspiring left to be discharged, and she was much better able to focus after that.  The issue that was raised in group today was that of being able to trust others.  This was in the context of being able to trust the Dr that one is working with re: mental health diagnosis.  [name] shared that change in Dr's and providers has happened to her many times, and as a consequence, she is reluctant to tell her whole story.  "They usually just get part of it, whatever I feel like sharing."  She also stated her reaction to losing others and betrayal is to isolate.  She is guardedly optimistic that things will be better since she just recently started working with an ACT team.  [name] 12/10/2013 , 12:27 PM

## 2013-12-10 NOTE — Progress Notes (Signed)
RN 1:1 Note  D: Patient in the dayroom interacting and engaging with peers  A: Continue 1:1 observation  R: Patient is animated, talkative, laughing and joking on the unit; patient has no acute needs at this time

## 2013-12-10 NOTE — [facility] Group Notes (Signed)
Adult Psychoeducational Group Note  Date:  12/10/2013 Time:  8:42 PM  Group Topic/Focus:  Wrap-Up Group:   The focus of this group is to help patients review their daily goal of treatment and discuss progress on daily workbooks.  Participation Level:  Active  Participation Quality:  Appropriate  Affect:  Appropriate  Cognitive:  Appropriate  Insight: Appropriate  Engagement in Group:  Engaged  Modes of Intervention:  Education  Additional Comments:  Jolanta stated that her day was terrible and that she has learned to wear a smile because it covers up her pain.  She also said she was upset about her discharge situation with [facility].  She went on to say she spoke with her daughter but she doesn't like to talk to her children because they make her feel guilty about her situation. She also stated that she wants to she her granddaughter one more time then end it.  She explained that she is frustrated with post treatment care because in the past it has been inconsistent.  Jackson also said she constantly thinks about dying.  Verginia Toohey A Ria Comment 12/10/2013, 8:42 PM

## 2013-12-10 NOTE — Progress Notes (Signed)
RN 1:1 Note  D: Patient is in the dayroom interacting and laughing and joking; patient reports that she had a bowel movement  A: Monitored q 15 minutes; patient encouraged to attend groups; patient educated about medications; patient given medications per physician orders; patient encouraged to express feelings and/or concerns  R: Patient is animated and appropriate to circumstances; patient has no acute distress at this time

## 2013-12-11 MED ORDER — LAMOTRIGINE 200 MG PO TABS
200.0000 mg | ORAL_TABLET | Freq: Every day | ORAL | Status: DC
  Filled 2013-12-11 (×2): qty 3

## 2013-12-11 MED ORDER — QUETIAPINE FUMARATE 400 MG PO TABS: 400.0000 mg | ORAL_TABLET | Freq: Every day | ORAL | Status: DC

## 2013-12-11 MED ORDER — QUETIAPINE FUMARATE 400 MG PO TABS: 400.0000 mg | ORAL_TABLET | Freq: Every day | ORAL | Status: AC

## 2013-12-11 MED ORDER — SERTRALINE HCL 100 MG PO TABS: 100.0000 mg | ORAL_TABLET | Freq: Every day | ORAL | Status: AC

## 2013-12-11 MED ORDER — TRAZODONE HCL 100 MG PO TABS: 100.0000 mg | ORAL_TABLET | Freq: Every evening | ORAL | Status: AC | PRN

## 2013-12-11 MED ORDER — LAMOTRIGINE 200 MG PO TABS: 200.0000 mg | ORAL_TABLET | Freq: Every day | ORAL | Status: AC

## 2013-12-11 MED ORDER — GABAPENTIN 100 MG PO CAPS: 200.0000 mg | ORAL_CAPSULE | Freq: Two times a day (BID) | ORAL | Status: AC

## 2013-12-11 MED ORDER — LITHIUM CARBONATE 600 MG PO CAPS: 600.0000 mg | ORAL_CAPSULE | Freq: Every day | ORAL | Status: AC

## 2013-12-11 MED ORDER — HYDROXYZINE HCL 50 MG PO TABS
50.0000 mg | ORAL_TABLET | Freq: Three times a day (TID) | ORAL | Status: DC | PRN
  Filled 2013-12-11: qty 9

## 2013-12-11 NOTE — Progress Notes (Signed)
[facility] Adult Case Management Discharge Plan :  Will you be returning to the same living situation after discharge: Yes,  home At discharge, do you have transportation home?:Yes,  yes Do you have the ability to pay for your medications:Yes,  MCD  Release of information consent forms completed and in the chart;  Patient's signature needed at discharge.  Patient to Follow up at: Follow-up Information   Follow up with [facility] ACT Team On 12/12/2013. Mariann Laster will check in with you on Thursday)    Contact information:   [address] [place] [address] Phone: [phone] Fax: [phone]      Patient denies SI/HI:   Yes,  yes    Safety Planning and Suicide Prevention discussed:  Yes,  yes  Trish Mage 12/11/2013, 10:26 AM

## 2013-12-11 NOTE — Tx Team (Signed)
  Interdisciplinary Treatment Plan Update   Date Reviewed:  12/11/2013  Time Reviewed:  10:15 AM  Progress in Treatment:   Attending groups: Yes Participating in groups: Yes Taking medication as prescribed: Yes  Tolerating medication: Yes Family/Significant other contact made: Yes  Patient understands diagnosis: Yes  Discussing patient identified problems/goals with staff: Yes Medical problems stabilized or resolved: Yes Denies suicidal/homicidal ideation: Yes Patient has not harmed self or others: Yes  For review of initial/current patient goals, please see plan of care.  Estimated Length of Stay:  D/C today  Reason for Continuation of Hospitalization:   New Problems/Goals identified:  N/A  Discharge Plan or Barriers:   return home, follow up [facility] ACT team  Additional Comments:  Attendees:  Signature: Corena Pilgrim, MD 12/11/2013 10:15 AM   Signature: Ripley Fraise, LCSW 12/11/2013 10:15 AM  Signature: Elmarie Shiley, NP 12/11/2013 10:15 AM  Signature: Mayra Neer, RN 12/11/2013 10:15 AM  Signature: Darrol Angel, RN 12/11/2013 10:15 AM  Signature:  12/11/2013 10:15 AM  Signature:   12/11/2013 10:15 AM  Signature:    Signature:    Signature:    Signature:    Signature:    Signature:      Scribe for Treatment Team:   Ripley Fraise, LCSW  12/11/2013 10:15 AM

## 2013-12-11 NOTE — Progress Notes (Signed)
Pt is in bed resting with her eyes closed  No complaints and no distress noted  Pt is on a 1:1 for self harm behaviors   She is presently safe

## 2013-12-11 NOTE — Progress Notes (Signed)
Pt. Discharged per MD orders;  PT. Currently denies any HI/SI or AVH.  Pt. Was given education regarding follow up appointments and medications by RN.  Pt. Denies any questions or concerns about the medications.  Pt. Was escorted to the search room to retrieve her belongings by RN before being discharged to the hospital lobby.  

## 2013-12-11 NOTE — Progress Notes (Signed)
Pt. Remains on 1:1 at this time, she is currently speaking with Dr. Loni Muse.  No acute distress noted.  Pt. Cooperative.

## 2013-12-11 NOTE — [facility] Suicide Risk Assessment (Signed)
   Demographic Factors:  Low socioeconomic status, Unemployed and Female  Total Time spent with patient: 20 minutes  Psychiatric Specialty Exam: Physical Exam  Psychiatric: She has a normal mood and affect. Her speech is normal and behavior is normal. Judgment and thought content normal. Cognition and memory are normal.    Review of Systems  Constitutional: Negative.   HENT: Negative.   Eyes: Negative.   Respiratory: Negative.   Cardiovascular: Negative.   Gastrointestinal: Negative.   Genitourinary: Negative.   Musculoskeletal: Negative.   Skin: Negative.   Neurological: Negative.   Endo/Heme/Allergies: Negative.   Psychiatric/Behavioral: Negative.     Blood pressure 113/80, pulse 92, temperature 98.3 F (36.8 C), temperature source Oral, resp. rate 20, height 5\' 5"  (1.651 m), weight 72.576 kg (160 lb).Body mass index is 26.63 kg/(m^2).  General Appearance: Fairly Groomed  Engineer, water::  Good  Speech:  Clear and Coherent and Normal Rate  Volume:  Normal  Mood:  Euthymic  Affect:  Appropriate  Thought Process:  Goal Directed  Orientation:  Full (Time, Place, and Person)  Thought Content:  Negative  Suicidal Thoughts:  No  Homicidal Thoughts:  No  Memory:  Immediate;   Good Recent;   Good Remote;   Good  Judgement:  Fair  Insight:  marginal  Psychomotor Activity:  Normal  Concentration:  Good  Recall:  Good  Fund of Knowledge:Good  Language: Good  Akathisia:  No  Handed:    AIMS (if indicated):     Assets:  Communication Skills Desire for Improvement Physical Health  Sleep:  Number of Hours: 5.5    Musculoskeletal: Strength & Muscle Tone: within normal limits Gait & Station: normal Patient leans: N/A   Mental Status Per Nursing Assessment::   On Admission:     Current Mental Status by Physician: patient denies suicidal ideation, intent or plan  Loss Factors: NA  Historical Factors: poor impulse control  Risk Reduction Factors:   Sense of  responsibility to family, Living with another person, especially a relative and Positive social support  Continued Clinical Symptoms:  Alcohol/Substance Abuse/Dependencies  Cognitive Features That Contribute To Risk:  Closed-mindedness    Suicide Risk:  Minimal: No identifiable suicidal ideation.  Patients presenting with no risk factors but with morbid ruminations; may be classified as minimal risk based on the severity of the depressive symptoms  Discharge Diagnoses:   AXIS I:  Bipolar I disorder, most recent episode depressed              Alcohol dependence AXIS II:  Cluster B Traits AXIS III:   Past Medical History  Diagnosis Date  . Hypertension   . Leg pain   . Carpal tunnel syndrome    AXIS IV:  other psychosocial or environmental problems and problems related to social environment AXIS V:  61-70 mild symptoms  Plan Of Care/Follow-up recommendations:  Activity:  as tolerated Diet:  healthy Tests:  lithium level Other:  patient to keep her after care appointment with her ACT Team  Is patient on multiple antipsychotic therapies at discharge:  No   Has Patient had three or more failed trials of antipsychotic monotherapy by history:  No  Recommended Plan for Multiple Antipsychotic Therapies: NA    Hansel Devan,MD 12/11/2013, 10:26 AM

## 2013-12-11 NOTE — Progress Notes (Signed)
Pt has been in her room sleeping with her eyes closed   No distress noted   Pt is on a 1:1 for self harm behaviors  She remains safe

## 2013-12-11 NOTE — Discharge Summary (Signed)
Physician Discharge Summary Note  Patient:  [name] is an 43 y.o., female MRN:  [id number] DOB:  [birth year] Patient phone:  [phone] (home)  Patient address:   [place] [address],  Total Time spent with patient: 20 minutes  Date of Admission:  11/29/2013 Date of Discharge: 12/11/13  Reason for Admission:  Depression, Psychosis, Suicidal thoughts  Discharge Diagnoses: Principal Problem:   Bipolar I disorder, most recent episode depressed Active Problems:   Alcohol dependence   Psychiatric Specialty Exam: Physical Exam  Psychiatric: She has a normal mood and affect. Her speech is normal and behavior is normal. Judgment and thought content normal. Cognition and memory are normal.    Review of Systems  Constitutional: Negative.   HENT: Negative.   Eyes: Negative.   Respiratory: Negative.   Cardiovascular: Negative.   Gastrointestinal: Negative.   Genitourinary: Negative.   Musculoskeletal: Negative.   Skin: Negative.   Neurological: Negative.   Endo/Heme/Allergies: Negative.   Psychiatric/Behavioral: Negative.     Blood pressure 113/80, pulse 92, temperature 98.3 F (36.8 C), temperature source Oral, resp. rate 20, height 5\' 5"  (1.651 m), weight 72.576 kg (160 lb).Body mass index is 26.63 kg/(m^2).  General Appearance: Fairly Groomed  Engineer, water::  Good  Speech:  Clear and Coherent and Normal Rate  Volume:  Normal  Mood:  Euthymic  Affect:  Appropriate  Thought Process:  Goal Directed  Orientation:  Full (Time, Place, and Person)  Thought Content:  Negative  Suicidal Thoughts:  No  Homicidal Thoughts:  No  Memory:  Immediate;   Good Recent;   Good Remote;   Good  Judgement:  Fair  Insight:  marginal   Psychomotor Activity:  Normal  Concentration:  Good  Recall:  Good  Fund of Knowledge:Good  Language: Good  Akathisia:  No  Handed:  Right  AIMS (if indicated):     Assets:  Communication Skills Desire for Improvement Physical  Health  Sleep:  Number of Hours: 5.5    Past Psychiatric History: See H&P Diagnosis:  Hospitalizations:  Outpatient Care:  Substance Abuse Care:  Self-Mutilation:  Suicidal Attempts:  Violent Behaviors:   Musculoskeletal: Strength & Muscle Tone: within normal limits Gait & Station: normal Patient leans: N/A  DSM5: AXIS I: Bipolar I disorder, most recent episode depressed  Alcohol dependence  AXIS II: Cluster B Traits  AXIS III:  Past Medical History   Diagnosis  Date   .  Hypertension    .  Leg pain    .  Carpal tunnel syndrome    AXIS IV: other psychosocial or environmental problems and problems related to social environment  AXIS V: 61-70 mild symptoms  Level of Care:  OP  Hospital Course:  [name] is a 43 year old female who states that got stressed out, was wanting to talk to someone to give her a reason to go on. Has not taken her medications in 2 weeks. States when she gets alcohol, opioids,and marijuana she calms h down. But admits the voices get worst, gets more paranoid because of PTSD. States he called the [facility] in [facility] but they did not call back. Got really drunk, smoking marijuna led to deeper depression and she 'knew" she was going to take her life. States she called the crisis line who called the police. States the voices were "killing her" the police found her in the parking lot at [place]. Has been drinking that day. Would have been 5 months the 23 rd  of being sober. States son was acting out, throwing things around, worried about her daughter who is pregnant and is living here and there. States that she had to pull out the daughter's boyfriend as he was smoking pot at the house. Eventually her daughter gave birth and she had to allowed her in.          [name] was admitted to the adult 400 unit where she was evaluated and her symptoms were identified. Medication management was discussed and implemented. Patient was restarted on her  psychiatric mediations that included Zoloft, Seroquel, Neurontin, Lamictal, and Lithium. Patient received prn ativan for symptoms of anxiety. However, she was noted to be overusing this medications so it was decreased. Patient was attempting to stay sedated in order to avoid dealing with her stressors.  She was encouraged to participate in unit programming. Medical problems were identified and treated appropriately. Home medication was restarted as needed.  She was evaluated each day by a clinical provider to ascertain the patient's response to treatment. Patient required 1:1 observation due to severe depression and suicidal ideation. At times she was also unsteady on her feet. Attempts to take her off 1:1 proved unsuccessful. On one occasion she was placed back on after thirty minutes of being off.  After a second attempt to take her off close observation the patient was found to have used a broken plastic fork and a screw she had removed from the window to cut her wrist. She was not able to seriously injure herself and was placed back on close observation. Patient continued to report severe depression and suicidal intent. She eventually made some progress in treatment.  Improvement was noted by the patient's report of decreasing symptoms, improved sleep and appetite, affect, medication tolerance, behavior, and participation in unit programming.  The patient was asked each day to complete a self inventory noting mood, mental status, pain, new symptoms, anxiety and concerns. Patient could be irritable with staff at times and could be resistant to care.          She responded well to medication and being in a therapeutic and supportive environment. Patient began to interact more with peers and express hope for her future. Patient requested to discharge on her birthday and appeared to genuinely want to celebrate this occasion. The treatment team discussed her progress and her denial of suicidal thoughts. She was  noted to be acting very appropriately and was pleasant with staff.  Positive and appropriate behavior was noted and the patient was motivated for recovery.  She worked closely with the treatment team and case manager to develop a discharge plan with appropriate goals. Coping skills, problem solving as well as relaxation therapies were also part of the unit programming.         By the day of discharge she was in much improved condition than upon admission.  Symptoms were reported as significantly decreased or resolved completely. The patient denied SI/HI and voiced no AVH. She was motivated to continue taking medication with a goal of continued improvement in mental health. Tamya Tiley was discharged home with a plan to follow up as noted below. Patient will [facility] ACT team after discharge. She was provided with prescriptions and three day supply of medications. Patient denied any intent to harm self and planned to celebrate her birthday with family.   Consults:  psychiatry  Significant Diagnostic Studies:  Chem profile, UA, UDS  Discharge Vitals:   Blood pressure 113/80,  pulse 92, temperature 98.3 F (36.8 C), temperature source Oral, resp. rate 20, height 5\' 5"  (1.651 m), weight 72.576 kg (160 lb). Body mass index is 26.63 kg/(m^2). Lab Results:   No results found for this or any previous visit (from the past 72 hour(s)).  Physical Findings: AIMS: Facial and Oral Movements Muscles of Facial Expression: None, normal Lips and Perioral Area: None, normal Jaw: None, normal Tongue: None, normal,Extremity Movements Upper (arms, wrists, hands, fingers): None, normal Lower (legs, knees, ankles, toes): None, normal, Trunk Movements Neck, shoulders, hips: None, normal, Overall Severity Severity of abnormal movements (highest score from questions above): None, normal Incapacitation due to abnormal movements: None, normal Patient's awareness of abnormal movements (rate only  patient's report): No Awareness, Dental Status Current problems with teeth and/or dentures?: No Does patient usually wear dentures?: No  CIWA:  CIWA-Ar Total: 16 COWS:     Psychiatric Specialty Exam: See Psychiatric Specialty Exam and Suicide Risk Assessment completed by Attending Physician prior to discharge.  Discharge destination:  Home  Is patient on multiple antipsychotic therapies at discharge:  No   Has Patient had three or more failed trials of antipsychotic monotherapy by history:  No  Recommended Plan for Multiple Antipsychotic Therapies: NA     Medication List       Indication   gabapentin 100 MG capsule  Commonly known as:  NEURONTIN  Take 2 capsules (200 mg total) by mouth 2 (two) times daily.   Indication:  Agitation, Neuropathic Pain     hydrOXYzine 50 MG tablet  Commonly known as:  ATARAX/VISTARIL  Take 50 mg by mouth 3 (three) times daily as needed for anxiety.      ibuprofen 800 MG tablet  Commonly known as:  ADVIL,MOTRIN  Take 800 mg by mouth 3 (three) times daily as needed for moderate pain.      lamoTRIgine 200 MG tablet  Commonly known as:  LAMICTAL  Take 1 tablet (200 mg total) by mouth daily.  Start taking on:  12/12/2013   Indication:  Depression, Depressive Phase of Manic-Depression     lithium 600 MG capsule  Take 1 capsule (600 mg total) by mouth at bedtime.   Indication:  Depression, Recurrent suicidal thoughts     QUEtiapine 400 MG tablet  Commonly known as:  SEROQUEL  Take 1 tablet (400 mg total) by mouth at bedtime.   Indication:  Depressive Phase of Manic-Depression, Trouble Sleeping, Mood lability     sertraline 100 MG tablet  Commonly known as:  ZOLOFT  Take 1 tablet (100 mg total) by mouth daily.   Indication:  Major Depressive Disorder     traZODone 100 MG tablet  Commonly known as:  DESYREL  Take 1 tablet (100 mg total) by mouth at bedtime as needed for sleep.   Indication:  Trouble Sleeping           Follow-up  Information   Follow up with [facility] ACT Team On 12/12/2013. Mariann Laster will check in with you on Thursday)    Contact information:   [address], [place] [address] Phone: [phone] Fax: [phone]     Follow-up recommendations:   Activity: as tolerated  Diet: healthy  Tests: lithium level  Other: patient to keep her after care appointment with her ACT Team   Comments:   Take all your medications as prescribed by your mental healthcare provider.  Report any adverse effects and or reactions from your medicines to your outpatient provider promptly.  Patient is  instructed and cautioned to not engage in alcohol and or illegal drug use while on prescription medicines.  In the event of worsening symptoms, patient is instructed to call the crisis hotline, 911 and or go to the nearest ED for appropriate evaluation and treatment of symptoms.  Follow-up with your primary care provider for your other medical issues, concerns and or health care needs.   Total Discharge Time:  Greater than 30 minutes.  Signed: Elmarie Shiley NP-C 12/11/2013, 10:25 AM  Patient seen, evaluated and I agree with notes by Nurse Practitioner. Corena Pilgrim, MD

## 2013-12-13 NOTE — Progress Notes (Signed)
Patient Discharge Instructions:  After Visit Summary (AVS):   Faxed to:  12/13/13 Discharge Summary Note:   Faxed to:  12/13/13 Psychiatric Admission Assessment Note:   Faxed to:  12/13/13 Suicide Risk Assessment - Discharge Assessment:   Faxed to:  12/13/13 Faxed/Sent to the Next Level Care provider:  12/13/13 Faxed to [facility] [place], 12/13/2013, 3:56 PM

## 2013-12-17 IMAGING — CR DG CERVICAL SPINE COMPLETE 4+V
6 series · 6 of 6 positions shown · non-contrast
Comparison: Lateral soft tissue neck 09/02/2010.

CLINICAL DATA: Pain in the neck with numbness and tingling in the
right arm and fingers.

CERVICAL SPINE - COMPLETE 4+ VIEW

[w c-spine lat]
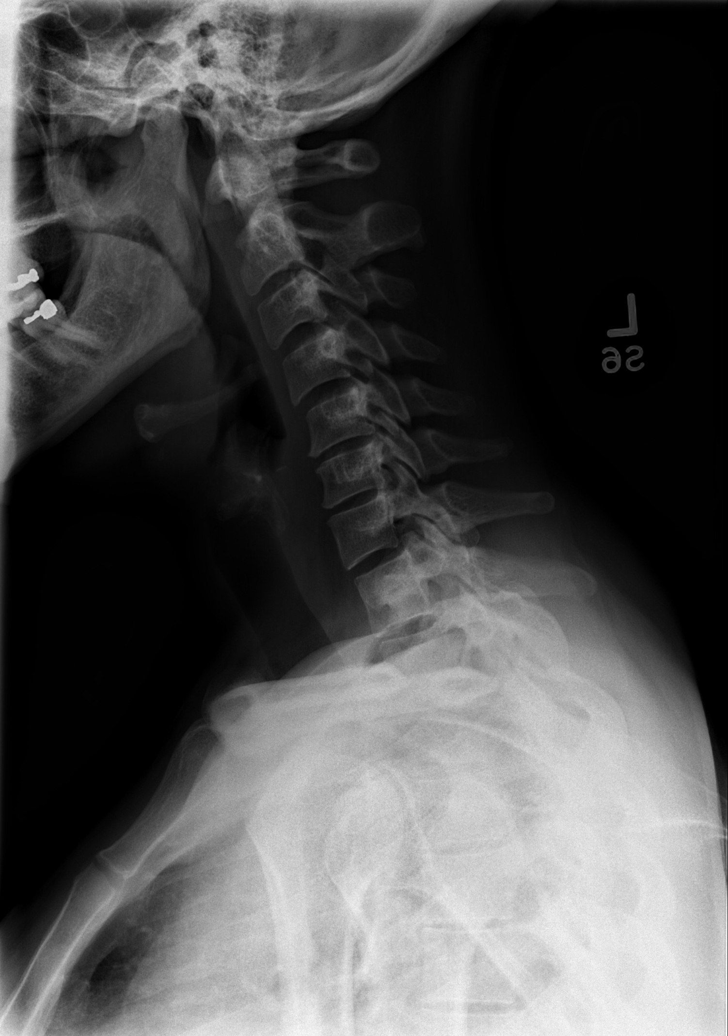

[w c-spine oblique (1 of 2)]
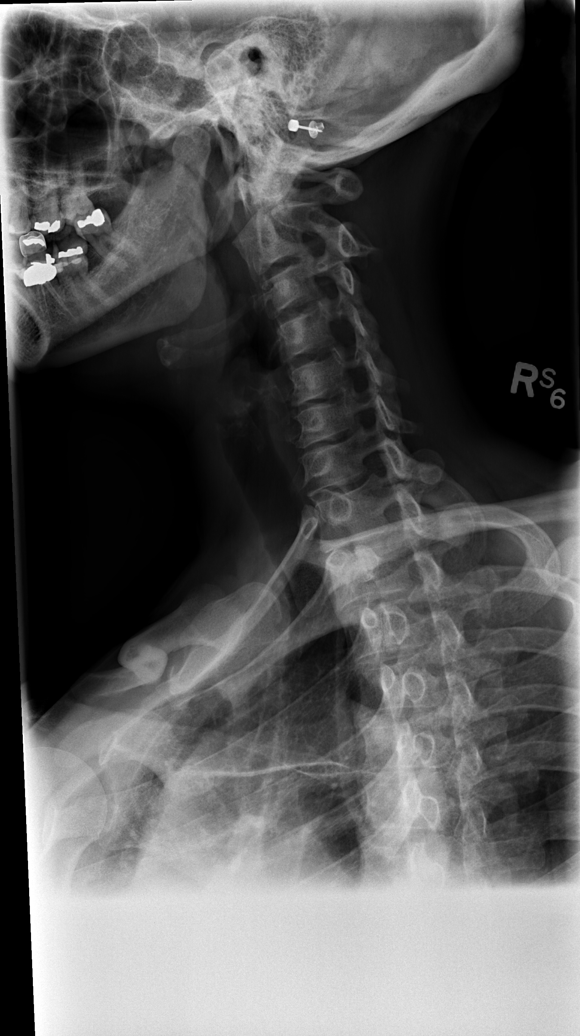

[w c-spine oblique (2 of 2)]
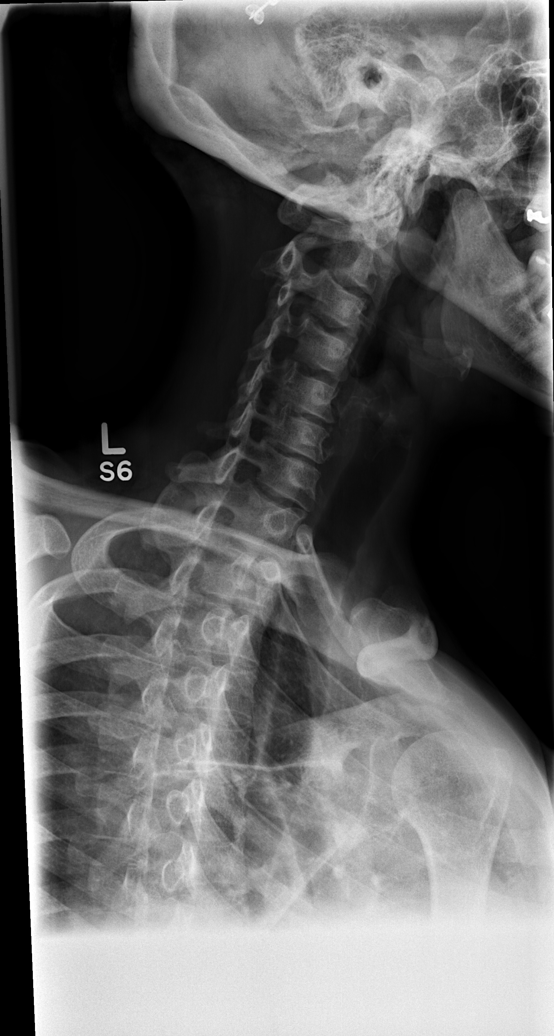

[w c-spine a.p.]
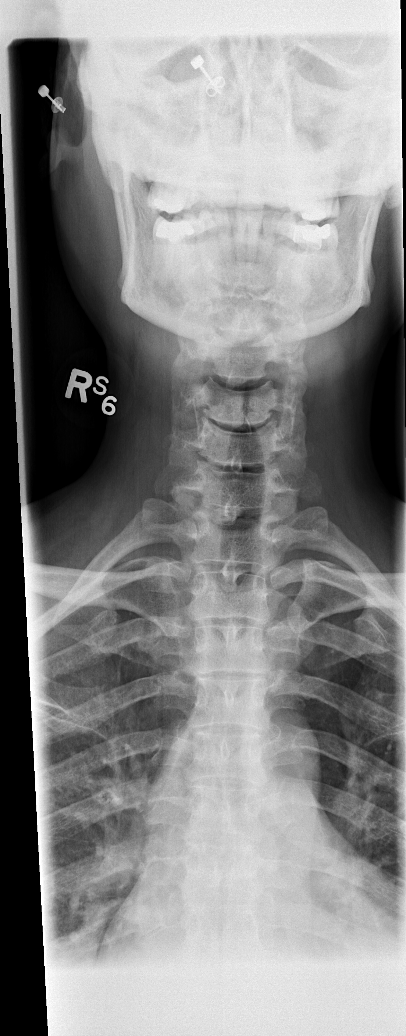

[w c-spine odontoid (1 of 2)]
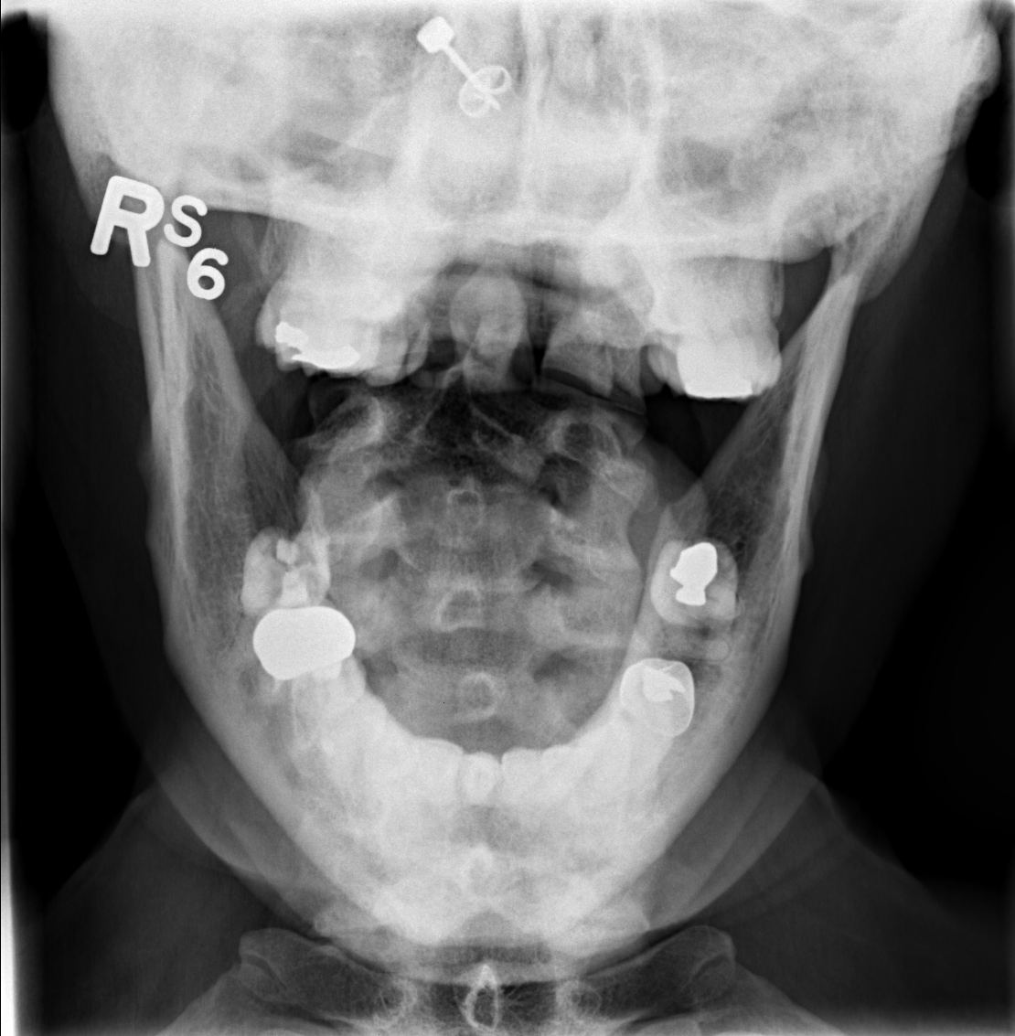

[w c-spine odontoid (2 of 2)]
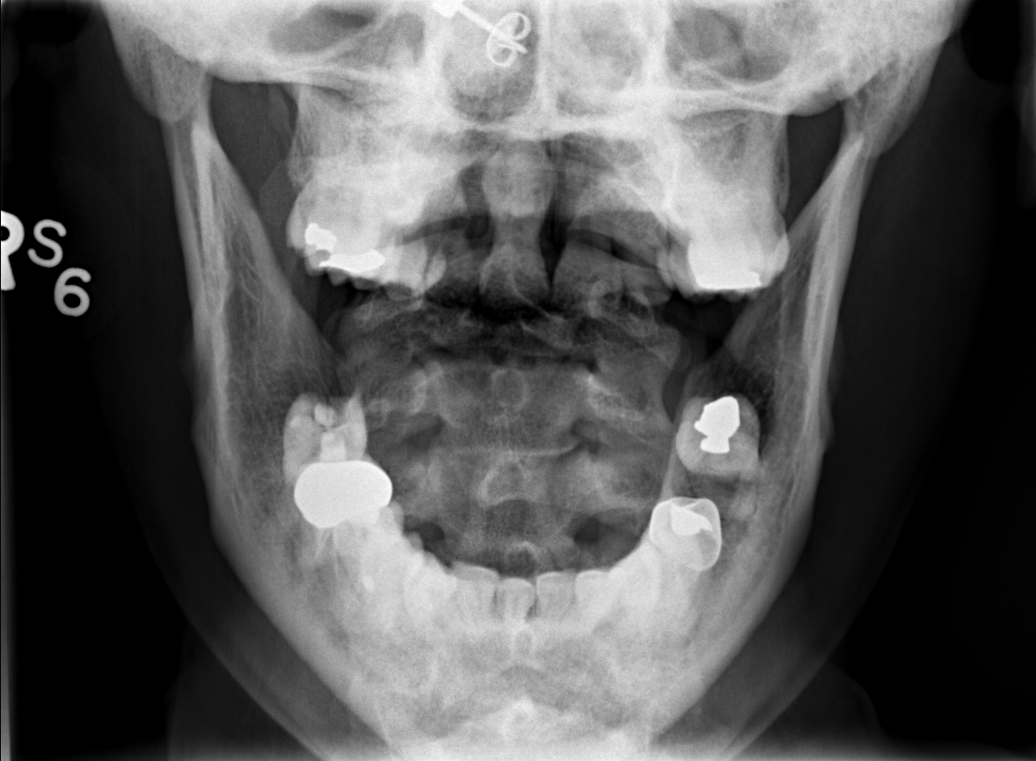

[6 of 6 positions shown; findings below may reference images not displayed]

FINDINGS: Six views of the cervical spine demonstrate no definite
acute displaced fracture.  Mild reversal of the normal cervical
lordosis at the level of C4-C5.  Alignment otherwise anatomic.
Mild multilevel degenerative disc disease, most severe is C5-C6.
Mild multilevel facet arthropathy.
IMPRESSION: 1.  No acute radiographic abnormality of the cervical spine.
2.  Mild multilevel degenerative disc disease and cervical
spondylosis, as above.

## 2014-05-12 ENCOUNTER — Encounter (HOSPITAL_COMMUNITY): Payer: Self-pay | Admitting: *Deleted

## 2015-02-10 ENCOUNTER — Encounter (HOSPITAL_COMMUNITY): Payer: Self-pay | Admitting: Emergency Medicine

## 2015-02-10 ENCOUNTER — Emergency Department (HOSPITAL_COMMUNITY)
Admission: EM | Admit: 2015-02-10 | Discharge: 2015-02-10 | Disposition: A | Discharge status: Home / Self Care | Payer: Medicaid Other | Attending: Emergency Medicine | Admitting: Emergency Medicine

## 2015-02-10 ENCOUNTER — Emergency Department (HOSPITAL_COMMUNITY): Payer: Medicaid Other

## 2015-02-10 DIAGNOSIS — J159 Unspecified bacterial pneumonia: Secondary | ICD-10-CM | POA: Insufficient documentation

## 2015-02-10 DIAGNOSIS — I10 Essential (primary) hypertension: Secondary | ICD-10-CM | POA: Diagnosis not present

## 2015-02-10 DIAGNOSIS — Z91040 Latex allergy status: Secondary | ICD-10-CM | POA: Insufficient documentation

## 2015-02-10 DIAGNOSIS — Z8669 Personal history of other diseases of the nervous system and sense organs: Secondary | ICD-10-CM | POA: Insufficient documentation

## 2015-02-10 DIAGNOSIS — F319 Bipolar disorder, unspecified: Secondary | ICD-10-CM | POA: Diagnosis not present

## 2015-02-10 DIAGNOSIS — Z87891 Personal history of nicotine dependence: Secondary | ICD-10-CM | POA: Diagnosis not present

## 2015-02-10 DIAGNOSIS — J189 Pneumonia, unspecified organism: Secondary | ICD-10-CM

## 2015-02-10 DIAGNOSIS — Z79899 Other long term (current) drug therapy: Secondary | ICD-10-CM | POA: Insufficient documentation

## 2015-02-10 DIAGNOSIS — R079 Chest pain, unspecified: Secondary | ICD-10-CM | POA: Diagnosis present

## 2015-02-10 LAB — I-STAT TROPONIN, ED: Troponin i, poc: 0 ng/mL (ref 0.00–0.08)

## 2015-02-10 LAB — CBC WITH DIFFERENTIAL/PLATELET
Basophils Absolute: 0 10*3/uL (ref 0.0–0.1)
Basophils Relative: 0 % (ref 0–1)
Eosinophils Absolute: 0.2 10*3/uL (ref 0.0–0.7)
Eosinophils Relative: 3 % (ref 0–5)
HCT: 31.7 % — ABNORMAL LOW (ref 36.0–46.0)
Hemoglobin: 10.4 g/dL — ABNORMAL LOW (ref 12.0–15.0)
Lymphocytes Relative: 20 % (ref 12–46)
Lymphs Abs: 1.4 10*3/uL (ref 0.7–4.0)
MCH: 27.4 pg (ref 26.0–34.0)
MCHC: 32.8 g/dL (ref 30.0–36.0)
MCV: 83.6 fL (ref 78.0–100.0)
MONO ABS: 0.7 10*3/uL (ref 0.1–1.0)
Monocytes Relative: 10 % (ref 3–12)
Neutro Abs: 4.8 10*3/uL (ref 1.7–7.7)
Neutrophils Relative %: 67 % (ref 43–77)
Platelets: 347 10*3/uL (ref 150–400)
RBC: 3.79 MIL/uL — AB (ref 3.87–5.11)
RDW: 14.7 % (ref 11.5–15.5)
WBC: 7.2 10*3/uL (ref 4.0–10.5)

## 2015-02-10 LAB — COMPREHENSIVE METABOLIC PANEL
ALT: 14 U/L (ref 14–54)
ANION GAP: 6 (ref 5–15)
AST: 33 U/L (ref 15–41)
Albumin: 3.1 g/dL — ABNORMAL LOW (ref 3.5–5.0)
Alkaline Phosphatase: 50 U/L (ref 38–126)
BILIRUBIN TOTAL: 0.3 mg/dL (ref 0.3–1.2)
CO2: 26 mmol/L (ref 22–32)
CREATININE: 0.84 mg/dL (ref 0.44–1.00)
Calcium: 9.1 mg/dL (ref 8.9–10.3)
Chloride: 103 mmol/L (ref 101–111)
GFR calc Af Amer: >60 mL/min (ref >60)
GLUCOSE: 109 mg/dL — AB (ref 65–99)
POTASSIUM: 3.4 mmol/L — AB (ref 3.5–5.1)
Sodium: 135 mmol/L (ref 135–145)
Total Protein: 6.9 g/dL (ref 6.5–8.1)

## 2015-02-10 MED ORDER — DOXYCYCLINE HYCLATE 100 MG PO TABS
100.0000 mg | ORAL_TABLET | Freq: Once | ORAL | Status: AC
  Administered 2015-02-10: 100 mg via ORAL
  Filled 2015-02-10: qty 1

## 2015-02-10 MED ORDER — AZITHROMYCIN 250 MG PO TABS
500.0000 mg | ORAL_TABLET | Freq: Once | ORAL | Status: DC
Start: 2015-02-10 — End: 2015-02-10

## 2015-02-10 MED ORDER — DOXYCYCLINE HYCLATE 100 MG PO CAPS: 100.0000 mg | ORAL_CAPSULE | Freq: Two times a day (BID) | ORAL | Status: DC

## 2015-02-10 NOTE — ED Provider Notes (Signed)
CSN: [id number]     Arrival date & time 02/10/15  0131 History   This chart was scribed for Linton Flemings, MD by Hansel Feinstein, ED Scribe. This patient was seen in room D33C/D33C and the patient's care was started at 2:07 AM.     Chief Complaint  Patient presents with  . Chest Pain    [facility] due to the patient saying she was having chest pain.  The patient, according to EMS was falling in and out of sleep and snoring.  She did rate her pain 10/10.   The history is provided by the patient. No language interpreter was used.    HPI Comments: [name] is a 44 y.o. female with Hx of HTN, substance abuse, bipolar disorder with Hx of HTN, substance abuse, bipolar disorder, depression, psychosis who presents to the Emergency Department complaining of moderate, gradual onset left-sided CP onset 2 days ago. She states that pain is worsened by breathing and unaffected by touch. No OTC pain medications tried PTA. Pt states she took 325 mg Aspirin upon arrival to the hospital. She notes that she took Seroquel last night. Pt was falling in and out of sleep upon exam. She denies other symptoms.   Past Medical History  Diagnosis Date  . Hypertension   . Leg pain   . Substance abuse   . Bipolar 1 disorder   . Depression   . Psychosis   . Carpal tunnel syndrome    Past Surgical History  Procedure Laterality Date  . Appendectomy    . Tonsillectomy     Family History  Problem Relation Age of Onset  . Lung cancer Mother   . Colon cancer Father   . Diabetes Father   . Cancer Maternal Grandmother   . Cancer Maternal Grandfather   . Cancer Paternal Grandmother   . Prostate cancer Father   . Colon polyps Mother    History  Substance Use Topics  . Smoking status: Former Smoker    Quit date: 07/11/1996  . Smokeless tobacco: Never Used  . Alcohol Use: 7.2 oz/week    12 Cans of beer per week     Comment: 2-3 40 oz daily    OB History    Gravida Para  Term Preterm AB TAB SAB Ectopic Multiple Living   8 7 6 1 1 1    7      Review of Systems  Unable to perform ROS: Other  Cardiovascular: Positive for chest pain.   Somnolent Allergies  Latex  Home Medications   Prior to Admission medications   Medication Sig Start Date End Date Taking? Authorizing Provider  gabapentin (NEURONTIN) 100 MG capsule Take 2 capsules (200 mg total) by mouth 2 (two) times daily. 12/11/13   Niel Hummer, NP  hydrOXYzine (ATARAX/VISTARIL) 50 MG tablet Take 50 mg by mouth 3 (three) times daily as needed for anxiety.    Historical Provider, MD  ibuprofen (ADVIL,MOTRIN) 800 MG tablet Take 800 mg by mouth 3 (three) times daily as needed for moderate pain.    Historical Provider, MD  lamoTRIgine (LAMICTAL) 200 MG tablet Take 1 tablet (200 mg total) by mouth daily. 12/12/13   Niel Hummer, NP  lithium carbonate 600 MG capsule Take 1 capsule (600 mg total) by mouth at bedtime. 12/11/13   Niel Hummer, NP  QUEtiapine (SEROQUEL) 400 MG tablet Take 1 tablet (400 mg total) by mouth at bedtime. 12/11/13   Mickel Baas  Hiram Gash, NP  sertraline (ZOLOFT) 100 MG tablet Take 1 tablet (100 mg total) by mouth daily. 12/11/13   Niel Hummer, NP  traZODone (DESYREL) 100 MG tablet Take 1 tablet (100 mg total) by mouth at bedtime as needed for sleep. 12/11/13   Niel Hummer, NP   BP 102/65 mmHg  Pulse 79  Temp(Src) 97.8 F (36.6 C) (Oral)  Resp 16  SpO2 98% Physical Exam  Constitutional: She appears well-developed and well-nourished. No distress.  HENT:  Head: Normocephalic and atraumatic.  Nose: Nose normal.  Mouth/Throat: Oropharynx is clear and moist.  Eyes: Conjunctivae and EOM are normal. Pupils are equal, round, and reactive to light.  Neck: Normal range of motion. Neck supple. No JVD present. No tracheal deviation present. No thyromegaly present.  Cardiovascular: Normal rate, regular rhythm, normal heart sounds and intact distal pulses.  Exam reveals no gallop and no friction rub.    No murmur heard. Pulmonary/Chest: Effort normal and breath sounds normal. No stridor. No respiratory distress. She has no wheezes. She has no rales. She exhibits no tenderness.  Abdominal: Soft. Bowel sounds are normal. She exhibits no distension and no mass. There is no tenderness. There is no rebound and no guarding.  Musculoskeletal: Normal range of motion. She exhibits no edema or tenderness.  Lymphadenopathy:    She has no cervical adenopathy.  Neurological: She displays normal reflexes. She exhibits normal muscle tone. Coordination normal.  Patient is extremely somnolent, will arouse with stimulation  Skin: Skin is warm and dry. No rash noted. No erythema. No pallor.  Nursing note and vitals reviewed.  ED Course  Procedures (including critical care time) DIAGNOSTIC STUDIES: Oxygen Saturation is 96% on RA, adequate by my interpretation.    COORDINATION OF CARE: 2:12 AM Discussed treatment plan with pt at bedside and pt agreed to plan.   Labs Review Labs Reviewed  COMPREHENSIVE METABOLIC PANEL - Abnormal; Notable for the following:    Potassium 3.4 (*)    Glucose, Bld 109 (*)    BUN <5 (*)    Albumin 3.1 (*)    All other components within normal limits  CBC WITH DIFFERENTIAL/PLATELET - Abnormal; Notable for the following:    RBC 3.79 (*)    Hemoglobin 10.4 (*)    HCT 31.7 (*)    All other components within normal limits  I-STAT TROPOININ, ED    Imaging Review Dg Chest 2 View  02/10/2015   CLINICAL DATA:  Left-sided chest pain, onset tonight  EXAM: CHEST  2 VIEW  COMPARISON:  None.  FINDINGS: Linear opacities in the posterior left lower lobe may represent early infiltrate or atelectasis. Right lung is clear. There is no pleural effusion. Hilar, mediastinal and cardiac contours appear unremarkable.  IMPRESSION: Posterior left lower lobe linear opacities, atelectasis versus infectious infiltrate.   Electronically Signed   By: Andreas Newport M.D.   On: 02/10/2015 02:42      EKG Interpretation   Date/Time:  Tuesday February 10 2015 01:42:35 EDT Ventricular Rate:  82 PR Interval:  175 QRS Duration: 85 QT Interval:  455 QTC Calculation: 531 R Axis:   27 Text Interpretation:  Sinus rhythm Anteroseptal infarct, old Nonspecific T  abnormalities, lateral leads Prolonged QT interval Confirmed by Zyaira Vejar  MD,  Jordyn Doane ([id number]) on 02/10/2015 1:51:00 AM      MDM   Final diagnoses:  CAP (community acquired pneumonia)    44 year old female with chest pain.  Patient is taken her evening Seroquel, and extremely  somnolent, difficult historian.  Chest x-ray with possible infiltrate in the area that she is indicating pain.  Will treat with doxycycline as QT prolongation is a concern with Seroquel and azithromycin. I personally performed the services described in this documentation, which was scribed in my presence. The recorded information has been reviewed and is accurate.    Linton Flemings, MD 02/10/15 [id number]

## 2015-02-10 NOTE — Discharge Instructions (Signed)

## 2015-02-10 NOTE — ED Notes (Signed)
[facility] due to the patient saying she was having chest pain.  The patient, according to EMS was falling in and out of sleep and snoring.  She did rate her pain 10/10.  She was given 325mg  of Aspirin by staff and EMS gave her one Nitro.  The patient was transported here with no problems.

## 2015-11-12 ENCOUNTER — Ambulatory Visit: Payer: Medicaid Other | Admitting: Obstetrics

## 2015-12-02 ENCOUNTER — Encounter: Payer: Self-pay | Admitting: Obstetrics

## 2015-12-02 ENCOUNTER — Ambulatory Visit (INDEPENDENT_AMBULATORY_CARE_PROVIDER_SITE_OTHER): Payer: Medicaid Other | Admitting: Obstetrics

## 2015-12-02 VITALS — BP 124/84 | HR 73 | Wt 165.0 lb

## 2015-12-02 DIAGNOSIS — Z1239 Encounter for other screening for malignant neoplasm of breast: Secondary | ICD-10-CM

## 2015-12-02 DIAGNOSIS — G43009 Migraine without aura, not intractable, without status migrainosus: Secondary | ICD-10-CM

## 2015-12-02 DIAGNOSIS — N939 Abnormal uterine and vaginal bleeding, unspecified: Secondary | ICD-10-CM

## 2015-12-02 DIAGNOSIS — K2970 Gastritis, unspecified, without bleeding: Secondary | ICD-10-CM

## 2015-12-02 DIAGNOSIS — Z01419 Encounter for gynecological examination (general) (routine) without abnormal findings: Secondary | ICD-10-CM

## 2015-12-02 DIAGNOSIS — Z0000 Encounter for general adult medical examination without abnormal findings: Secondary | ICD-10-CM | POA: Diagnosis not present

## 2015-12-02 MED ORDER — BUTALBITAL-APAP-CAFFEINE 50-325-40 MG PO TABS: 2.0000 | ORAL_TABLET | Freq: Four times a day (QID) | ORAL | Status: AC | PRN

## 2015-12-03 LAB — COMPREHENSIVE METABOLIC PANEL
ALT: 10 IU/L (ref 0–32)
AST: 25 IU/L (ref 0–40)
Albumin/Globulin Ratio: 1.4 (ref 1.2–2.2)
Albumin: 4.4 g/dL (ref 3.5–5.5)
Alkaline Phosphatase: 44 IU/L (ref 39–117)
BUN/Creatinine Ratio: 10 (ref 9–23)
BUN: 9 mg/dL (ref 6–24)
Bilirubin Total: 0.3 mg/dL (ref 0.0–1.2)
CO2: 21 mmol/L (ref 18–29)
CREATININE: 0.91 mg/dL (ref 0.57–1.00)
Calcium: 9.3 mg/dL (ref 8.7–10.2)
Chloride: 100 mmol/L (ref 96–106)
GFR calc Af Amer: 88 mL/min/{1.73_m2} (ref >59)
GFR calc non Af Amer: 76 mL/min/{1.73_m2} (ref >59)
Globulin, Total: 3.2 g/dL (ref 1.5–4.5)
Glucose: 94 mg/dL (ref 65–99)
POTASSIUM: 4.2 mmol/L (ref 3.5–5.2)
Sodium: 140 mmol/L (ref 134–144)
Total Protein: 7.6 g/dL (ref 6.0–8.5)

## 2015-12-03 LAB — RPR: RPR: NONREACTIVE

## 2015-12-03 LAB — CBC
HEMATOCRIT: 31.8 % — AB (ref 34.0–46.6)
Hemoglobin: 10.5 g/dL — ABNORMAL LOW (ref 11.1–15.9)
MCH: 27.6 pg (ref 26.6–33.0)
MCHC: 33 g/dL (ref 31.5–35.7)
MCV: 84 fL (ref 79–97)
PLATELETS: 314 10*3/uL (ref 150–379)
RBC: 3.81 x10E6/uL (ref 3.77–5.28)
RDW: 15.7 % — ABNORMAL HIGH (ref 12.3–15.4)
WBC: 5.7 10*3/uL (ref 3.4–10.8)

## 2015-12-03 LAB — HEPATITIS B SURFACE ANTIGEN: HEP B S AG: NEGATIVE

## 2015-12-03 LAB — HEPATITIS C ANTIBODY

## 2015-12-03 LAB — TSH: TSH: 1.76 u[IU]/mL (ref 0.450–4.500)

## 2015-12-03 LAB — HIV ANTIBODY (ROUTINE TESTING W REFLEX): HIV Screen 4th Generation wRfx: NONREACTIVE

## 2015-12-05 ENCOUNTER — Encounter: Payer: Self-pay | Admitting: Obstetrics

## 2015-12-05 LAB — NUSWAB VG+, CANDIDA 6SP
CANDIDA KRUSEI, NAA: NEGATIVE
CANDIDA LUSITANIAE, NAA: NEGATIVE
CANDIDA PARAPSILOSIS, NAA: NEGATIVE
CANDIDA TROPICALIS, NAA: NEGATIVE
CHLAMYDIA TRACHOMATIS, NAA: NEGATIVE
Candida albicans, NAA: NEGATIVE
Candida glabrata, NAA: NEGATIVE
Neisseria gonorrhoeae, NAA: NEGATIVE
TRICH VAG BY NAA: NEGATIVE

## 2015-12-05 LAB — PAP IG AND HPV HIGH-RISK
HPV, high-risk: NEGATIVE
PAP Smear Comment: 0

## 2015-12-05 NOTE — Progress Notes (Signed)
Subjective:        [name] is a 45 y.o. female here for a routine exam.  Current complaints: None.    Personal health questionnaire:  Is patient Ashkenazi Jewish, have a family history of breast and/or ovarian cancer: no Is there a family history of uterine cancer diagnosed at age < 44, gastrointestinal cancer, urinary tract cancer, family member who is a Field seismologist syndrome-associated carrier: no Is the patient overweight and hypertensive, family history of diabetes, personal history of gestational diabetes, preeclampsia or PCOS: no Is patient over 17, have PCOS,  family history of premature CHD under age 108, diabetes, smoke, have hypertension or peripheral artery disease:  no At any time, has a partner hit, kicked or otherwise hurt or frightened you?: no Over the past 2 weeks, have you felt down, depressed or hopeless?: no Over the past 2 weeks, have you felt little interest or pleasure in doing things?:no   Gynecologic History Patient's last menstrual period was 11/02/2015. Contraception: tubal ligation Last Pap: 2014. Results were: normal Last mammogram: 2010. Results were: normal  Obstetric History OB History  Gravida Para Term Preterm AB SAB TAB Ectopic Multiple Living  8 7 6 1 1  1   7     # Outcome Date GA Lbr Len/2nd Weight Sex Delivery Anes PTL Lv  8 Term 10/18/06 [redacted]w[redacted]d   M    Y  7 Preterm 09/10/05 [redacted]w[redacted]d   F    Y     Comments: pre-eclampsia  6 Term 05/19/98 [redacted]w[redacted]d   M    Y  5 Term 09/18/96 [redacted]w[redacted]d   M    Y  4 Term 08/23/93 [redacted]w[redacted]d   F    Y  3 Term 01/03/89 [redacted]w[redacted]d   F    Y  2 Term 10/09/83 [redacted]w[redacted]d   M    Y  1 TAB               Past Medical History  Diagnosis Date  . Hypertension   . Leg pain   . Substance abuse   . Bipolar 1 disorder ([place])   . Depression   . Psychosis   . Carpal tunnel syndrome     Past Surgical History  Procedure Laterality Date  . Appendectomy    . Tonsillectomy       Current outpatient prescriptions:  .  gabapentin (NEURONTIN) 100  MG capsule, Take 2 capsules (200 mg total) by mouth 2 (two) times daily., Disp: 60 capsule, Rfl: 0 .  ibuprofen (ADVIL,MOTRIN) 800 MG tablet, Take 800 mg by mouth 3 (three) times daily as needed for moderate pain., Disp: , Rfl:  .  lamoTRIgine (LAMICTAL) 200 MG tablet, Take 1 tablet (200 mg total) by mouth daily., Disp: 30 tablet, Rfl: 0 .  lithium carbonate 600 MG capsule, Take 1 capsule (600 mg total) by mouth at bedtime., Disp: 30 capsule, Rfl: 0 .  QUEtiapine (SEROQUEL) 400 MG tablet, Take 1 tablet (400 mg total) by mouth at bedtime., Disp: 30 tablet, Rfl: 0 .  sertraline (ZOLOFT) 100 MG tablet, Take 1 tablet (100 mg total) by mouth daily., Disp: 30 tablet, Rfl: 0 .  butalbital-acetaminophen-caffeine (FIORICET) 50-325-40 MG tablet, Take 2 tablets by mouth every 6 (six) hours as needed for headache or migraine., Disp: 40 tablet, Rfl: 2 .  hydrOXYzine (ATARAX/VISTARIL) 50 MG tablet, Take 50 mg by mouth 3 (three) times daily as needed for anxiety. Reported on 12/02/2015, Disp: , Rfl:  .  traZODone (DESYREL) 100 MG tablet,  Take 1 tablet (100 mg total) by mouth at bedtime as needed for sleep. (Patient not taking: Reported on 12/02/2015), Disp: 30 tablet, Rfl: 0 Allergies  Allergen Reactions  . Latex Rash    Social History  Substance Use Topics  . Smoking status: Former Smoker    Quit date: 07/11/1996  . Smokeless tobacco: Never Used  . Alcohol Use: No     Comment: 2-3 40 oz daily     Family History  Problem Relation Age of Onset  . Lung cancer Mother   . Colon cancer Father   . Diabetes Father   . Cancer Maternal Grandmother   . Cancer Maternal Grandfather   . Cancer Paternal Grandmother   . Prostate cancer Father   . Colon polyps Mother       Review of Systems  Constitutional: negative for fatigue and weight loss Respiratory: negative for cough and wheezing Cardiovascular: negative for chest pain, fatigue and palpitations Gastrointestinal: negative for abdominal pain and change  in bowel habits Musculoskeletal:negative for myalgias Neurological: negative for gait problems and tremors Behavioral/Psych: negative for abusive relationship, depression Endocrine: negative for temperature intolerance   Genitourinary:negative for abnormal menstrual periods, genital lesions, hot flashes, sexual problems and vaginal discharge Integument/breast: negative for breast lump, breast tenderness, nipple discharge and skin lesion(s)    Objective:       BP 124/84 mmHg  Pulse 73  Wt 165 lb (74.844 kg)  LMP 11/02/2015 General:   alert  Skin:   no rash or abnormalities  Lungs:   clear to auscultation bilaterally  Heart:   regular rate and rhythm, S1, S2 normal, no murmur, click, rub or gallop  Breasts:   normal without suspicious masses, skin or nipple changes or axillary nodes  Abdomen:  normal findings: no organomegaly, soft, non-tender and no hernia  Pelvis:  External genitalia: normal general appearance Urinary system: urethral meatus normal and bladder without fullness, nontender Vaginal: normal without tenderness, induration or masses Cervix: normal appearance Adnexa: normal bimanual exam Uterus: anteverted and non-tender, normal size   Lab Review Urine pregnancy test Labs reviewed yes Radiologic studies reviewed yes    Assessment:    Healthy female exam.     H/O AUB,fibroids, stable  H/O Gastritis, stable.  H/O Migraines, stable   Plan:    F/U ultrasound ordered for Fibroids  Gastroenterology F/U ordered for Gastritis  Neurology F/U ordered for Migraines   Education reviewed: calcium supplements, low fat, low cholesterol diet, safe sex/STD prevention and self breast exams. Mammogram ordered. Follow up in: 1 year.   Meds ordered this encounter  Medications  . butalbital-acetaminophen-caffeine (FIORICET) 50-325-40 MG tablet    Sig: Take 2 tablets by mouth every 6 (six) hours as needed for headache or migraine.    Dispense:  40 tablet    Refill:  2    Orders Placed This Encounter  Procedures  . US Transvaginal Non-OB    Standing Status: Future     Number of Occurrences:      Standing Expiration Date: 01/31/2017    Order Specific Question:  Reason for Exam (SYMPTOM  OR DIAGNOSIS REQUIRED)    Answer:  AUB    Order Specific Question:  Preferred imaging location?    Answer:  Internal  . US Pelvis Complete    Standing Status: Future     Number of Occurrences:      Standing Expiration Date: 01/31/2017    Order Specific Question:  Reason for Exam (SYMPTOM  OR DIAGNOSIS REQUIRED)  Answer:  AUB    Order Specific Question:  Preferred imaging location?    Answer:  Internal  . MM Digital Screening    Standing Status: Future     Number of Occurrences:      Standing Expiration Date: 01/31/2017    Order Specific Question:  Reason for Exam (SYMPTOM  OR DIAGNOSIS REQUIRED)    Answer:  Screening    Order Specific Question:  Is the patient pregnant?    Answer:  No    Order Specific Question:  Preferred imaging location?    Answer:  [facility]  . Comprehensive metabolic panel  . TSH  . CBC  . HIV antibody  . Hepatitis B surface antigen  . RPR  . Hepatitis C antibody  . Ambulatory referral to Gastroenterology    Referral Priority:  Routine    Referral Type:  Consultation    Referral Reason:  Specialty Services Required    Number of Visits Requested:  1

## 2015-12-08 ENCOUNTER — Other Ambulatory Visit: Payer: Medicaid Other

## 2015-12-09 ENCOUNTER — Encounter: Payer: Self-pay | Admitting: Obstetrics

## 2015-12-09 ENCOUNTER — Ambulatory Visit: Payer: Medicaid Other | Admitting: Obstetrics

## 2015-12-10 ENCOUNTER — Encounter: Payer: Self-pay | Admitting: Gastroenterology

## 2015-12-10 ENCOUNTER — Other Ambulatory Visit: Payer: Medicaid Other

## 2015-12-12 NOTE — Progress Notes (Signed)
Appointment cancelled.  Oz Gammel A. Jodi Mourning MD

## 2015-12-16 ENCOUNTER — Ambulatory Visit: Payer: Self-pay

## 2015-12-17 ENCOUNTER — Ambulatory Visit (INDEPENDENT_AMBULATORY_CARE_PROVIDER_SITE_OTHER): Payer: Medicaid Other

## 2015-12-17 DIAGNOSIS — N939 Abnormal uterine and vaginal bleeding, unspecified: Secondary | ICD-10-CM

## 2015-12-29 ENCOUNTER — Ambulatory Visit: Payer: Medicaid Other | Admitting: Obstetrics

## 2015-12-30 ENCOUNTER — Ambulatory Visit
Admission: RE | Admit: 2015-12-30 | Discharge: 2015-12-30 | Disposition: A | Discharge status: Home / Self Care | Payer: Medicaid Other | Source: Ambulatory Visit | Attending: Obstetrics | Admitting: Obstetrics

## 2015-12-30 DIAGNOSIS — Z1239 Encounter for other screening for malignant neoplasm of breast: Secondary | ICD-10-CM

## 2016-01-13 ENCOUNTER — Ambulatory Visit: Payer: Medicaid Other | Admitting: Obstetrics

## 2016-01-15 ENCOUNTER — Ambulatory Visit: Payer: Medicaid Other | Admitting: Obstetrics

## 2016-01-29 ENCOUNTER — Ambulatory Visit: Payer: Medicaid Other | Admitting: Obstetrics

## 2016-02-08 ENCOUNTER — Ambulatory Visit: Payer: Self-pay | Admitting: Gastroenterology

## 2016-02-08 ENCOUNTER — Ambulatory Visit: Payer: Self-pay | Admitting: Internal Medicine

## 2016-02-09 ENCOUNTER — Telehealth: Payer: Self-pay | Admitting: *Deleted

## 2016-02-09 NOTE — Telephone Encounter (Signed)
No show letter mailed to patient. 

## 2020-05-11 ENCOUNTER — Emergency Department: Admit: 2020-05-11 | Payer: MEDICAID | Primary: Student in an Organized Health Care Education/Training Program

## 2020-05-11 ENCOUNTER — Inpatient Hospital Stay
Admit: 2020-05-11 | Discharge: 2020-05-12 | Discharge status: Against Medical Advice | Payer: MEDICAID | Attending: Internal Medicine | Admitting: Internal Medicine

## 2020-05-11 ENCOUNTER — Inpatient Hospital Stay
Admit: 2020-05-11 | Payer: MEDICAID | Attending: Emergency Medicine | Primary: Student in an Organized Health Care Education/Training Program

## 2020-05-11 DIAGNOSIS — T68XXXA Hypothermia, initial encounter: Principal | ICD-10-CM

## 2020-05-11 LAB — URINALYSIS W/ RFLX MICROSCOPIC
Bilirubin, Urine: NEGATIVE
Bilirubin, Urine: NEGATIVE
Bilirubin: NEGATIVE
Bilirubin: NEGATIVE
Glucose, Ur: NEGATIVE mg/dL
Glucose, Ur: NEGATIVE mg/dL
Glucose: NEGATIVE mg/dL
Glucose: NEGATIVE mg/dL
Ketone: NEGATIVE mg/dL
Ketone: NEGATIVE mg/dL
Ketones, Urine: NEGATIVE mg/dL
Ketones, Urine: NEGATIVE mg/dL
Nitrite, Urine: NEGATIVE
Nitrite, Urine: NEGATIVE
Nitrites: NEGATIVE
Nitrites: NEGATIVE
Protein, UA: NEGATIVE mg/dL
Protein, UA: NEGATIVE mg/dL
Protein: NEGATIVE mg/dL
Protein: NEGATIVE mg/dL
Specific Gravity, UA: 1.01 (ref 1.005–1.030)
Specific Gravity, UA: 1.01 (ref 1.005–1.030)
Specific gravity: 1.01 (ref 1.005–1.030)
Specific gravity: 1.01 (ref 1.005–1.030)
Urobilinogen, UA, POCT: 0.2 EU/dL (ref 0.2–1.0)
Urobilinogen, UA, POCT: 0.2 EU/dL (ref 0.2–1.0)
Urobilinogen: 0.2 EU/dL (ref 0.2–1.0)
Urobilinogen: 0.2 EU/dL (ref 0.2–1.0)
pH (UA): 6 (ref 5.0–8.0)
pH (UA): 6 (ref 5.0–8.0)
pH, UA: 6 (ref 5.0–8.0)
pH, UA: 6 (ref 5.0–8.0)

## 2020-05-11 LAB — NT-PRO BNP: NT pro-BNP: 34 PG/ML (ref 0–900)

## 2020-05-11 LAB — CBC WITH AUTOMATED DIFF
ABS. BASOPHILS: 0 10*3/uL (ref 0.0–0.1)
ABS. EOSINOPHILS: 0.1 10*3/uL (ref 0.0–0.4)
ABS. LYMPHOCYTES: 2 10*3/uL (ref 0.9–3.6)
ABS. MONOCYTES: 0.3 10*3/uL (ref 0.05–1.2)
ABS. NEUTROPHILS: 2 10*3/uL (ref 1.8–8.0)
BASOPHILS: 1 % (ref 0–2)
EOSINOPHILS: 2 % (ref 0–5)
HCT: 32.5 % — ABNORMAL LOW (ref 35.0–45.0)
HGB: 10.7 g/dL — ABNORMAL LOW (ref 12.0–16.0)
LYMPHOCYTES: 44 % (ref 21–52)
MCH: 33.2 PG (ref 24.0–34.0)
MCHC: 32.9 g/dL (ref 31.0–37.0)
MCV: 100.9 FL — ABNORMAL HIGH (ref 78.0–100.0)
MONOCYTES: 7 % (ref 3–10)
MPV: 9.1 FL — ABNORMAL LOW (ref 9.2–11.8)
NEUTROPHILS: 46 % (ref 40–73)
PLATELET: 205 10*3/uL (ref 135–420)
RBC: 3.22 M/uL — ABNORMAL LOW (ref 4.20–5.30)
RDW: 13.9 % (ref 11.6–14.5)
WBC: 4.5 10*3/uL — ABNORMAL LOW (ref 4.6–13.2)

## 2020-05-11 LAB — URINE MICROSCOPIC ONLY
RBC, UA: 6 /hpf (ref 0–5)
RBC: 6 /hpf (ref 0–5)
WBC, UA: 4 /hpf (ref 0–4)
WBC: 4 /hpf (ref 0–4)

## 2020-05-11 LAB — EKG, 12 LEAD, INITIAL
Atrial Rate: 88 {beats}/min
Calculated P Axis: 63 degrees
Calculated R Axis: 35 degrees
Calculated T Axis: -89 degrees
Diagnosis: NORMAL
P-R Interval: 192 ms
Q-T Interval: 442 ms
QRS Duration: 94 ms
QTC Calculation (Bezet): 534 ms
Ventricular Rate: 88 {beats}/min

## 2020-05-11 LAB — BLOOD GAS,CHEM8,LACTIC ACID POC
Anion gap, POC: 16 (ref 10–20)
Base deficit (POC): 4.5 mmol/L
CO2, POC: 22 MMOL/L (ref 19–24)
Calcium, ionized (POC): 1.13 mmol/L (ref 1.12–1.32)
Chloride (POC): 105 mmol/L (ref 98–107)
Creatinine (POC): 1.06 mg/dL (ref 0.6–1.3)
GFRAA, POC: >60 mL/min/{1.73_m2} (ref >60)
GFRNA, POC: 55 mL/min/{1.73_m2} — ABNORMAL LOW (ref >60)
Glucose (POC): 178 mg/dL — ABNORMAL HIGH (ref 65–100)
HCO3 (POC): 21 MMOL/L — ABNORMAL LOW (ref 22–26)
Lactic Acid (POC): 11.75 mmol/L — CR (ref 0.40–2.00)
O2 SAT: 49 %
Potassium (POC): 4.6 mmol/L (ref 3.5–5.1)
Sodium (POC): 142 mmol/L (ref 136–145)
pCO2, venous (POC): 39.7 MMHG — ABNORMAL LOW (ref 41–51)
pH, venous (POC): 7.33 (ref 7.32–7.42)
pO2, venous (POC): 28 mmHg (ref 25–40)

## 2020-05-11 LAB — RESPIRATORY VIRUS PANEL W/COVID-19, PCR
Adenovirus: NOT DETECTED
Adenovirus: NOT DETECTED
B. parapertussis, PCR: NOT DETECTED
BORDETELLA PARAPERTUSSIS PCR, BORPAR: NOT DETECTED
Bordetella pertussis - PCR: NOT DETECTED
Bordetella pertussis by PCR: NOT DETECTED
CORONAVIRUS 229E: NOT DETECTED
CORONAVIRUS HKU1: NOT DETECTED
CORONAVIRUS NL63: NOT DETECTED
CORONAVIRUS OC43: NOT DETECTED
Chlamydophila pneumoniae DNA, QL, PCR: NOT DETECTED
Chlamydophilia pneumoniae by PCR: NOT DETECTED
Coronavirus 229E: NOT DETECTED
Coronavirus CVNL63: NOT DETECTED
Coronavirus HKU1: NOT DETECTED
Coronavirus OC43: NOT DETECTED
H1N1 by PCR: NOT DETECTED
INFLUENZA A H1: NOT DETECTED
INFLUENZA A H1N1 PCR: NOT DETECTED
INFLUENZA A: NOT DETECTED
INFLUENZA B: NOT DETECTED
Influenza A H3: NOT DETECTED
Influenza A, subtype H1: NOT DETECTED
Influenza A, subtype H3: NOT DETECTED
Influenza A: NOT DETECTED
Influenza B: NOT DETECTED
Metapneumovirus: NOT DETECTED
Metapneumovirus: NOT DETECTED
Mycoplasma pneumoniae DNA, QL, PCR: NOT DETECTED
Mycoplasma pneumoniae by PCR: NOT DETECTED
PARAINFLUENZA 4: NOT DETECTED
Parainfluenza 1: NOT DETECTED
Parainfluenza 1: NOT DETECTED
Parainfluenza 2: NOT DETECTED
Parainfluenza 2: NOT DETECTED
Parainfluenza 3: NOT DETECTED
Parainfluenza 3: NOT DETECTED
Parainfluenza virus 4: NOT DETECTED
RSV by PCR: NOT DETECTED
RSV by PCR: NOT DETECTED
Rhinovirus Enterovirus PCR: NOT DETECTED
Rhinovirus and Enterovirus: NOT DETECTED
SARS Coronavirus-2: NOT DETECTED
SARS-CoV-2, PCR: NOT DETECTED

## 2020-05-11 LAB — DRUG SCREEN, URINE
AMPHETAMINES: NEGATIVE
Amphetamine Screen, Urine: NEGATIVE
BARBITURATES: NEGATIVE
BENZODIAZEPINES: NEGATIVE
Barbiturate Screen, Urine: NEGATIVE
Benzodiazepine Screen, Urine: NEGATIVE
COCAINE: NEGATIVE
Cocaine Screen Urine: NEGATIVE
METHADONE: NEGATIVE
Methadone Screen, Urine: NEGATIVE
OPIATES: NEGATIVE
Opiate Screen, Urine: NEGATIVE
PCP Screen, Urine: NEGATIVE
PCP(PHENCYCLIDINE): NEGATIVE
THC (TH-CANNABINOL): NEGATIVE
THC Screen, Urine: NEGATIVE

## 2020-05-11 LAB — POC LACTIC ACID
Lactic Acid (POC): 7.52 mmol/L — CR (ref 0.40–2.00)
Lactic Acid (POC): 1.03 mmol/L (ref 0.40–2.00)

## 2020-05-11 LAB — METABOLIC PANEL, COMPREHENSIVE
A-G Ratio: 0.7 — ABNORMAL LOW (ref 0.8–1.7)
ALT (SGPT): 52 U/L (ref 13–56)
AST (SGOT): 197 U/L — ABNORMAL HIGH (ref 10–38)
Albumin: 2.9 g/dL — ABNORMAL LOW (ref 3.4–5.0)
Alk. phosphatase: 89 U/L (ref 45–117)
Anion gap: 12 mmol/L (ref 3.0–18)
BUN/Creatinine ratio: 5 — ABNORMAL LOW (ref 12–20)
BUN: 6 MG/DL — ABNORMAL LOW (ref 7.0–18)
Bilirubin, total: 0.7 MG/DL (ref 0.2–1.0)
CO2: 19 mmol/L — ABNORMAL LOW (ref 21–32)
Calcium: 8.4 MG/DL — ABNORMAL LOW (ref 8.5–10.1)
Chloride: 107 mmol/L (ref 100–111)
Creatinine: 1.14 MG/DL (ref 0.6–1.3)
GFR est AA: >60 mL/min/{1.73_m2} (ref >60)
GFR est non-AA: 50 mL/min/{1.73_m2} — ABNORMAL LOW (ref >60)
Globulin: 4.4 g/dL — ABNORMAL HIGH (ref 2.0–4.0)
Glucose: 176 mg/dL — ABNORMAL HIGH (ref 74–99)
Potassium: 3.8 mmol/L (ref 3.5–5.5)
Protein, total: 7.3 g/dL (ref 6.4–8.2)
Sodium: 138 mmol/L (ref 136–145)

## 2020-05-11 LAB — LITHIUM
Lithium Level: 0.4 MMOL/L — ABNORMAL LOW (ref 0.6–1.2)
Lithium level: 0.4 MMOL/L — ABNORMAL LOW (ref 0.6–1.2)

## 2020-05-11 LAB — MAGNESIUM
Magnesium: 2.1 mg/dL (ref 1.6–2.6)
Magnesium: 2.1 mg/dL (ref 1.6–2.6)

## 2020-05-11 LAB — SALICYLATE
Salicylate level: <1.7 MG/DL — ABNORMAL LOW (ref 2.8–20.0)
Salicylate: <1.7 MG/DL — ABNORMAL LOW (ref 2.8–20.0)

## 2020-05-11 LAB — TSH 3RD GENERATION
TSH: 7.23 u[IU]/mL — ABNORMAL HIGH (ref 0.36–3.74)
TSH: 7.23 u[IU]/mL — ABNORMAL HIGH (ref 0.36–3.74)

## 2020-05-11 LAB — TROPONIN I: Troponin-I, QT: <0.02 NG/ML (ref 0.0–0.045)

## 2020-05-11 LAB — PROCALCITONIN
Procalcitonin: 0.18 ng/mL
Procalcitonin: 0.18 ng/mL

## 2020-05-11 LAB — LIPASE
Lipase: 113 U/L (ref 73–393)
Lipase: 113 U/L (ref 73–393)

## 2020-05-11 LAB — T4, FREE
T4 Free: 0.6 NG/DL — ABNORMAL LOW (ref 0.7–1.5)
T4, Free: 0.6 NG/DL — ABNORMAL LOW (ref 0.7–1.5)

## 2020-05-11 LAB — ACETAMINOPHEN: Acetaminophen level: <2 ug/mL — ABNORMAL LOW (ref 10.0–30.0)

## 2020-05-11 LAB — HCG QL SERUM
HCG, Ql.: NEGATIVE
HCG, Ql.: NEGATIVE

## 2020-05-11 LAB — ETHYL ALCOHOL
ALCOHOL(ETHYL),SERUM: 112 MG/DL — ABNORMAL HIGH (ref 0–3)
Ethyl Alcohol: 112 MG/DL — ABNORMAL HIGH (ref 0–3)

## 2020-05-11 LAB — EKG 12-LEAD
Atrial Rate: 88 {beats}/min
Diagnosis: NORMAL
P Axis: 63 degrees
P-R Interval: 192 ms
Q-T Interval: 442 ms
QRS Duration: 94 ms
QTc Calculation (Bazett): 534 ms
R Axis: 35 degrees
T Axis: -89 degrees
Ventricular Rate: 88 {beats}/min

## 2020-05-11 LAB — CBC WITH AUTO DIFFERENTIAL
Basophils %: 1 % (ref 0–2)
Basophils Absolute: 0 10*3/uL (ref 0.0–0.1)
Eosinophils %: 2 % (ref 0–5)
Eosinophils Absolute: 0.1 10*3/uL (ref 0.0–0.4)
Hematocrit: 32.5 % — ABNORMAL LOW (ref 35.0–45.0)
Hemoglobin: 10.7 g/dL — ABNORMAL LOW (ref 12.0–16.0)
Lymphocytes %: 44 % (ref 21–52)
Lymphocytes Absolute: 2 10*3/uL (ref 0.9–3.6)
MCH: 33.2 PG (ref 24.0–34.0)
MCHC: 32.9 g/dL (ref 31.0–37.0)
MCV: 100.9 FL — ABNORMAL HIGH (ref 78.0–100.0)
MPV: 9.1 FL — ABNORMAL LOW (ref 9.2–11.8)
Monocytes %: 7 % (ref 3–10)
Monocytes Absolute: 0.3 10*3/uL (ref 0.05–1.2)
Neutrophils %: 46 % (ref 40–73)
Neutrophils Absolute: 2 10*3/uL (ref 1.8–8.0)
Platelets: 205 10*3/uL (ref 135–420)
RBC: 3.22 M/uL — ABNORMAL LOW (ref 4.20–5.30)
RDW: 13.9 % (ref 11.6–14.5)
WBC: 4.5 10*3/uL — ABNORMAL LOW (ref 4.6–13.2)

## 2020-05-11 LAB — COMPREHENSIVE METABOLIC PANEL
ALT: 52 U/L (ref 13–56)
AST: 197 U/L — ABNORMAL HIGH (ref 10–38)
Albumin/Globulin Ratio: 0.7 — ABNORMAL LOW (ref 0.8–1.7)
Albumin: 2.9 g/dL — ABNORMAL LOW (ref 3.4–5.0)
Alkaline Phosphatase: 89 U/L (ref 45–117)
Anion Gap: 12 mmol/L (ref 3.0–18)
BUN: 6 MG/DL — ABNORMAL LOW (ref 7.0–18)
Bun/Cre Ratio: 5 — ABNORMAL LOW (ref 12–20)
CO2: 19 mmol/L — ABNORMAL LOW (ref 21–32)
Calcium: 8.4 MG/DL — ABNORMAL LOW (ref 8.5–10.1)
Chloride: 107 mmol/L (ref 100–111)
Creatinine: 1.14 MG/DL (ref 0.6–1.3)
EGFR IF NonAfrican American: 50 mL/min/{1.73_m2} — ABNORMAL LOW (ref >60)
GFR African American: >60 mL/min/{1.73_m2} (ref >60)
Globulin: 4.4 g/dL — ABNORMAL HIGH (ref 2.0–4.0)
Glucose: 176 mg/dL — ABNORMAL HIGH (ref 74–99)
Potassium: 3.8 mmol/L (ref 3.5–5.5)
Sodium: 138 mmol/L (ref 136–145)
Total Bilirubin: 0.7 MG/DL (ref 0.2–1.0)
Total Protein: 7.3 g/dL (ref 6.4–8.2)

## 2020-05-11 LAB — POCT LACTIC ACID
POC Lactic Acid: 7.52 mmol/L (ref 0.40–2.00)
POC Lactic Acid: 1.03 mmol/L (ref 0.40–2.00)

## 2020-05-11 LAB — TROPONIN: Troponin I: <0.02 NG/ML (ref 0.0–0.045)

## 2020-05-11 LAB — PROBNP, N-TERMINAL: BNP: 34 PG/ML (ref 0–900)

## 2020-05-11 LAB — ACETAMINOPHEN LEVEL: Acetaminophen Level: <2 ug/mL — ABNORMAL LOW (ref 10.0–30.0)

## 2020-05-11 MED ORDER — LITHIUM CARBONATE 300 MG CAP
300 mg | Freq: Every evening | ORAL | Status: DC
Start: 2020-05-11 — End: 2020-05-12
  Administered 2020-05-12: 01:00:00 via ORAL

## 2020-05-11 MED ORDER — PIPERACILLIN-TAZOBACTAM 3.375 GRAM IV SOLR
3.375 gram | INTRAVENOUS | Status: AC
Start: 2020-05-11 — End: 2020-05-11
  Administered 2020-05-11: 14:00:00 via INTRAVENOUS

## 2020-05-11 MED ORDER — SODIUM CHLORIDE 0.9 % IJ SYRG
INTRAMUSCULAR | Status: DC | PRN
Start: 2020-05-11 — End: 2020-05-12

## 2020-05-11 MED ORDER — PHARMACY INFORMATION NOTE
Freq: Once | Status: DC
Start: 2020-05-11 — End: 2020-05-11
  Administered 2020-05-12: 01:00:00

## 2020-05-11 MED ORDER — SODIUM CHLORIDE 0.9% BOLUS IV
0.9 % | Freq: Once | INTRAVENOUS | Status: AC
Start: 2020-05-11 — End: 2020-05-11

## 2020-05-11 MED ORDER — ENOXAPARIN 40 MG/0.4 ML SUB-Q SYRINGE
40 mg/0.4 mL | Freq: Every day | SUBCUTANEOUS | Status: DC
Start: 2020-05-11 — End: 2020-05-12
  Administered 2020-05-12: 13:00:00 via SUBCUTANEOUS

## 2020-05-11 MED ORDER — PROMETHAZINE 12.5 MG TAB
12.5 mg | Freq: Four times a day (QID) | ORAL | Status: DC | PRN
Start: 2020-05-11 — End: 2020-05-12

## 2020-05-11 MED ORDER — POLYETHYLENE GLYCOL 3350 17 GRAM (100 %) ORAL POWDER PACKET
17 gram | Freq: Every day | ORAL | Status: DC | PRN
Start: 2020-05-11 — End: 2020-05-12

## 2020-05-11 MED ORDER — SODIUM CHLORIDE 0.9% BOLUS IV
0.9 % | Freq: Once | INTRAVENOUS | Status: AC
Start: 2020-05-11 — End: 2020-05-11
  Administered 2020-05-11: 09:00:00 via INTRAVENOUS

## 2020-05-11 MED ORDER — BENZTROPINE 1 MG TAB
1 mg | Freq: Two times a day (BID) | ORAL | Status: DC
Start: 2020-05-11 — End: 2020-05-12
  Administered 2020-05-11 – 2020-05-12 (×2): via ORAL

## 2020-05-11 MED ORDER — QUETIAPINE 25 MG TAB
25 mg | Freq: Every day | ORAL | Status: DC
Start: 2020-05-11 — End: 2020-05-12

## 2020-05-11 MED ORDER — ONDANSETRON (PF) 4 MG/2 ML INJECTION
4 mg/2 mL | Freq: Four times a day (QID) | INTRAMUSCULAR | Status: DC | PRN
Start: 2020-05-11 — End: 2020-05-12

## 2020-05-11 MED ORDER — FOLIC ACID 1 MG TAB
1 mg | Freq: Every day | ORAL | Status: DC
Start: 2020-05-11 — End: 2020-05-12
  Administered 2020-05-11 – 2020-05-12 (×2): via ORAL

## 2020-05-11 MED ORDER — LITHIUM CARBONATE 300 MG TAB
300 mg | Freq: Every morning | ORAL | Status: DC
Start: 2020-05-11 — End: 2020-05-12
  Administered 2020-05-12: 12:00:00 via ORAL

## 2020-05-11 MED ORDER — SODIUM CHLORIDE 0.9 % IV PIGGY BACK
3.375 gram | Freq: Four times a day (QID) | INTRAVENOUS | Status: DC
Start: 2020-05-11 — End: 2020-05-11
  Administered 2020-05-11: 20:00:00 via INTRAVENOUS

## 2020-05-11 MED ORDER — IOPAMIDOL 61 % IV SOLN
300 mg iodine /mL (61 %) | Freq: Once | INTRAVENOUS | Status: AC
Start: 2020-05-11 — End: 2020-05-11
  Administered 2020-05-11: 11:00:00 via INTRAVENOUS

## 2020-05-11 MED ORDER — CARVEDILOL 6.25 MG TAB
6.25 mg | Freq: Two times a day (BID) | ORAL | Status: DC
Start: 2020-05-11 — End: 2020-05-12
  Administered 2020-05-11 – 2020-05-12 (×3): via ORAL

## 2020-05-11 MED ORDER — PANTOPRAZOLE 40 MG TAB, DELAYED RELEASE
40 mg | Freq: Every day | ORAL | Status: DC
Start: 2020-05-11 — End: 2020-05-12
  Administered 2020-05-12: 13:00:00 via ORAL

## 2020-05-11 MED ORDER — ACETAMINOPHEN 325 MG TABLET
325 mg | Freq: Four times a day (QID) | ORAL | Status: DC | PRN
Start: 2020-05-11 — End: 2020-05-12

## 2020-05-11 MED ORDER — THIAMINE HCL 100 MG TAB
100 mg | Freq: Every day | ORAL | Status: DC
Start: 2020-05-11 — End: 2020-05-12
  Administered 2020-05-11 – 2020-05-12 (×2): via ORAL

## 2020-05-11 MED ORDER — QUETIAPINE 200 MG TAB
200 mg | Freq: Every evening | ORAL | Status: DC
Start: 2020-05-11 — End: 2020-05-12

## 2020-05-11 MED ORDER — SODIUM CHLORIDE 0.9 % IV
750 mg | Freq: Two times a day (BID) | INTRAVENOUS | Status: DC
Start: 2020-05-11 — End: 2020-05-11

## 2020-05-11 MED ORDER — VANCOMYCIN IN 0.9% SODIUM CHLORIDE 1.5 G/500 ML IV
1.5 g/500 mL | Freq: Once | INTRAVENOUS | Status: AC
Start: 2020-05-11 — End: 2020-05-11
  Administered 2020-05-11: 13:00:00 via INTRAVENOUS

## 2020-05-11 MED ORDER — GABAPENTIN 300 MG CAP
300 mg | Freq: Three times a day (TID) | ORAL | Status: DC
Start: 2020-05-11 — End: 2020-05-12
  Administered 2020-05-11 – 2020-05-12 (×3): via ORAL

## 2020-05-11 MED ORDER — PRAZOSIN 1 MG CAP
1 mg | Freq: Every evening | ORAL | Status: DC
Start: 2020-05-11 — End: 2020-05-12
  Administered 2020-05-12: 01:00:00 via ORAL

## 2020-05-11 MED ORDER — ACETAMINOPHEN 650 MG RECTAL SUPPOSITORY
650 mg | Freq: Four times a day (QID) | RECTAL | Status: DC | PRN
Start: 2020-05-11 — End: 2020-05-12

## 2020-05-11 MED ORDER — SODIUM CHLORIDE 0.9 % IJ SYRG
Freq: Three times a day (TID) | INTRAMUSCULAR | Status: DC
Start: 2020-05-11 — End: 2020-05-12
  Administered 2020-05-12 (×3): via INTRAVENOUS

## 2020-05-11 MED FILL — BD POSIFLUSH NORMAL SALINE 0.9 % INJECTION SYRINGE: INTRAMUSCULAR | Qty: 40

## 2020-05-11 MED FILL — THIAMINE HCL 100 MG TAB: 100 mg | ORAL | Qty: 1

## 2020-05-11 MED FILL — SODIUM CHLORIDE 0.9 % IV: INTRAVENOUS | Qty: 1000

## 2020-05-11 MED FILL — LITHIUM CARBONATE 300 MG CAP: 300 mg | ORAL | Qty: 2

## 2020-05-11 MED FILL — PIPERACILLIN-TAZOBACTAM 3.375 GRAM IV SOLR: 3.375 gram | INTRAVENOUS | Qty: 3.38

## 2020-05-11 MED FILL — FOLIC ACID 1 MG TAB: 1 mg | ORAL | Qty: 1

## 2020-05-11 MED FILL — VANCOMYCIN IN 0.9% SODIUM CHLORIDE 1.5 G/500 ML IV: 1.5 g/500 mL | INTRAVENOUS | Qty: 500

## 2020-05-11 MED FILL — CARVEDILOL 6.25 MG TAB: 6.25 mg | ORAL | Qty: 1

## 2020-05-11 MED FILL — ISOVUE-300  61 % INTRAVENOUS SOLUTION: 300 mg iodine /mL (61 %) | INTRAVENOUS | Qty: 100

## 2020-05-11 MED FILL — PRAZOSIN 1 MG CAP: 1 mg | ORAL | Qty: 4

## 2020-05-11 MED FILL — BENZTROPINE 1 MG TAB: 1 mg | ORAL | Qty: 1

## 2020-05-11 MED FILL — PHARMACY INFORMATION NOTE: Qty: 1

## 2020-05-11 MED FILL — GABAPENTIN 300 MG CAP: 300 mg | ORAL | Qty: 1

## 2020-05-11 NOTE — Progress Notes (Signed)
 TRANSFER - IN REPORT:    Verbal report received from [place], RN(name) on Vicki Osborne  being received from ER(unit) for routine progression of care      Report consisted of patient's Situation, Background, Assessment and   Recommendations(SBAR).     Information from the following report(s) SBAR, Kardex, ED Summary, Intake/Output and MAR was reviewed with the receiving nurse.    Opportunity for questions and clarification was provided.      Assessment completed upon patient's arrival to unit and care assumed.

## 2020-05-11 NOTE — ED Notes (Signed)
 Patient arrived to ED via medic after she fell at her house. Medics were called and the patient was very hypotensive. Upon arrival to ED the patient was still very hypotensive (see triage vital signs). The patient had had a bowel movement during transport. The patient also admitted to drinking approximately 10 beers a couple of hours before EMS was called. She then informed the RN that she hit her head when she had fallen. The patient was cool to the touch and had a rectal temperature of 94.2 degrees Farenheit.

## 2020-05-11 NOTE — ED Notes (Signed)
ED Notes by Charma Igo, DO at 05/11/20 3154                Author: Charma Igo, DO  Service: --  Author Type: Physician       Filed: 05/11/20 1823  Date of Service: 05/11/20 0556  Status: Addendum          Editor: Charma Igo, DO (Physician)          Related Notes: Original Note by Charma Igo, DO (Physician) filed at 05/11/20 [phone]               [place]           Patient Name: Vicki Osborne        History of Presenting Illness        HPI:       49 year old female with no significant medical history presents to the ED via EMS for evaluation of alcohol intoxication and hypertension.  Initial SBP was in the 70s and EMS started 1 L normal saline.  Upon arrival, patient clinically intoxicated.  She  is complaining of some head pain, states she think she may have fallen earlier in the evening and hit her head.  She also is complaining of some generalized abdominal pain and several days of nausea and diarrhea that has been worsening. Reports drinking  etoh tonight, does not remember how much and states she did not do any other drugs. She is unable to provide further history due to her medical condition/intoxication.      PCP: Unknown, Provider, MD        No current facility-administered medications on file prior to encounter.          No current outpatient medications on file prior to encounter.             Past History        Past Medical History:   Patient unable to provide due to medical condition   Past Surgical History:   Patient unable to provide due to medical condition      Family History:   No family history on file.      Social History:     Social History          Tobacco Use         ?  Smoking status:  Not on file       Substance Use Topics         ?  Alcohol use:  Not on file         ?  Drug use:  Not on file           Allergies:   Not on File      Review of Nursing Notes: I have reviewed the relevant nursing  notes that were available at the time of this entry. Portions of the Family and  Social history, as well as medications and allergies, may have been entered into my documentation by nursing or other ancillary staff; I have confirmed and may have supplemented that information to the best of my ability at the time the note was reviewed.  There are some disagreements between the nursing notes and my evaluation at times. Some of the above referenced nursing documentation appears in my note after completion of my review.        Review of Systems     Unable to provide due to medical condition/intoxication.     Physical Exam  General: Intoxicated-appearing. Well-nourished/well-developed.   Head/Neck: Normocephalic, atraumatic. Nontender midline neck, normal ROM.  EENT:  PERRLA. EOM intact bilaterally. Oropharyngeal mucosa is dry. No lesions or erythema. No nasal discharge.   Cardiovascular: RRR, no  murmurs, gallops, or rubs. Peripheral pulses normal and intact in BUE and BLE.  Lungs/Chest: CTAB, no wheezing, rhonchi, or rales.    Abdomen: No distention. No organomegaly. No rebound, rigidity, or guarding.  Nontender.  Extremities/Skin:  Warm distal extremities No deformities. No edema. No rashes.   Neuro: A&O x 3, however, slow to respond. Grossly intact sensations  and motor function in upper and lower extremities bilaterally.  Psych: Flat affect.        Diagnostic Study Results        Labs -         Recent Results (from the past 12 hour(s))     METABOLIC PANEL, COMPREHENSIVE          Collection Time: 05/11/20  4:44 AM         Result  Value  Ref Range            Sodium  138  136 - 145 mmol/L       Potassium  3.8  3.5 - 5.5 mmol/L       Chloride  107  100 - 111 mmol/L       CO2  19 (L)  21 - 32 mmol/L       Anion gap  12  3.0 - 18 mmol/L       Glucose  176 (H)  74 - 99 mg/dL       BUN  6 (L)  7.0 - 18 MG/DL       Creatinine  1.14  0.6 - 1.3 MG/DL       BUN/Creatinine ratio  5 (L)  12 - 20         GFR est AA  >60  >60  ml/min/1.28m       GFR est non-AA  50 (L)  >60 ml/min/1.751m      Calcium  8.4 (L)  8.5 - 10.1 MG/DL       Bilirubin, total  0.7  0.2 - 1.0 MG/DL       ALT (SGPT)  52  13 - 56 U/L       AST (SGOT)  197 (H)  10 - 38 U/L       Alk. phosphatase  89  45 - 117 U/L       Protein, total  7.3  6.4 - 8.2 g/dL       Albumin  2.9 (L)  3.4 - 5.0 g/dL       Globulin  4.4 (H)  2.0 - 4.0 g/dL       A-G Ratio  0.7 (L)  0.8 - 1.7         CBC WITH AUTOMATED DIFF          Collection Time: 05/11/20  4:44 AM         Result  Value  Ref Range            WBC  4.5 (L)  4.6 - 13.2 K/uL       RBC  3.22 (L)  4.20 - 5.30 M/uL       HGB  10.7 (L)  12.0 - 16.0 g/dL       HCT  32.5 (L)  35.0 - 45.0 %       MCV  100.9 (H)  78.0 - 100.0 FL       MCH  33.2  24.0 - 34.0 PG       MCHC  32.9  31.0 - 37.0 g/dL       RDW  13.9  11.6 - 14.5 %       PLATELET  205  135 - 420 K/uL       MPV  9.1 (L)  9.2 - 11.8 FL       NEUTROPHILS  46  40 - 73 %       LYMPHOCYTES  44  21 - 52 %       MONOCYTES  7  3 - 10 %       EOSINOPHILS  2  0 - 5 %       BASOPHILS  1  0 - 2 %       ABS. NEUTROPHILS  2.0  1.8 - 8.0 K/UL       ABS. LYMPHOCYTES  2.0  0.9 - 3.6 K/UL       ABS. MONOCYTES  0.3  0.05 - 1.2 K/UL       ABS. EOSINOPHILS  0.1  0.0 - 0.4 K/UL       ABS. BASOPHILS  0.0  0.0 - 0.1 K/UL       DF  AUTOMATED          LIPASE          Collection Time: 05/11/20  4:44 AM         Result  Value  Ref Range            Lipase  113  73 - 393 U/L       TROPONIN I          Collection Time: 05/11/20  4:44 AM         Result  Value  Ref Range            Troponin-I, QT  <0.02  0.0 - 0.045 NG/ML       NT-PRO BNP          Collection Time: 05/11/20  4:44 AM         Result  Value  Ref Range            NT pro-BNP  34  0 - 900 PG/ML       MAGNESIUM          Collection Time: 05/11/20  4:44 AM         Result  Value  Ref Range            Magnesium  2.1  1.6 - 2.6 mg/dL       ETHYL ALCOHOL          Collection Time: 05/11/20  4:44 AM         Result  Value  Ref Range             ALCOHOL(ETHYL),SERUM  112 (H)  0 - 3 MG/DL       HCG QL SERUM          Collection Time: 05/11/20  4:44 AM         Result  Value  Ref Range            HCG, Ql.  Negative  NEG         BLOOD GAS,CHEM8,LACTIC ACID POC          Collection Time: 05/11/20  5:04 AM  Result  Value  Ref Range            Calcium, ionized (POC)  1.13  1.12 - 1.32 mmol/L       Base deficit (POC)  4.5  mmol/L       HCO3 (POC)  21.0 (L)  22 - 26 MMOL/L       CO2, POC  22  19 - 24 MMOL/L       O2 SAT  49  %       Sample source  VENOUS BLOOD          Performed by  Malachi Paradise         Sodium (POC)  142  136 - 145 mmol/L       Potassium (POC)  4.6  3.5 - 5.1 mmol/L       Glucose (POC)  178 (H)  65 - 100 mg/dL       Creatinine (POC)  1.06  0.6 - 1.3 mg/dL       Lactic Acid (POC)  11.75 (HH)  0.40 - 2.00 mmol/L       Chloride (POC)  105  98 - 107 mmol/L       Anion gap, POC  16  10 - 20         GFRAA, POC  >60  >60 ml/min/1.67m       GFRNA, POC  55 (L)  >60 ml/min/1.769m      pH, venous (POC)  7.33  7.32 - 7.42         pCO2, venous (POC)  39.7 (L)  41 - 51 MMHG            pO2, venous (POC)  28  25 - 40 mmHg            Critical value read back  DR K             Radiologic Studies -      XR CHEST PORT    (Results Pending)     CT ABD PELV W CONT    (Results Pending)       CT HEAD WO CONT    (Results Pending)          CT Results   (Last 48 hours)          None                 CXR Results   (Last 48 hours)          None                    Medical Decision Making        I am the first provider for this patient.       Vital Signs- I personally reviewed and interpreted the patient's vital signs.   Patient Vitals for the past 12 hrs:           Pulse  Resp  BP  SpO2           05/11/20 0515  88  16  (!) 86/52  99 %           05/11/20 0451  77  16  (!) 73/62  96 %           Records Reviewed: I reviewed the patient's records to interpret any previous medical data available to me including EKGs, previous medical  records, previous images, previous labs.  Provider Notes (Medical Decision Making):       49 year old female presents to the ED via EMS for altered mental status in the setting of EtOH use.  Initial SBP 70s in the field, was started on IV fluid by EMS.  Patient also reports several days  of nausea and diarrhea.  She had one episode of diarrhea here in the ER upon arrival.      Upon my examination, patient is afebrile and overall intoxicated appearing, encephalopathic.  She is alert and oriented x3 however slow to respond.  He is hypotensive with an SBP in the 80s, however, not tachycardic. Neurovascularly intact.      Given today's clinical picture, my differential diagnoses indlude: Hypovolemia, dehydration, EtOH abuse, polysubstance abuse, infectious diarrhea, functional diarrhea, UTI, electrolyte abnormality, anemia, ICH, SBO, gastroenteritis, less likely sepsis  as she was not tachycardic or tachypneic, her temperature was low at 34.7 Celsius and placed on a Bair hugger however patient has been getting a lot of cold IV fluids.      To further refine my diagnosis, I will order labs including CBC, CMP, lipase, UA, UDS, EtOH level, CT brain, CT abdomen/pelvis.  Will continue aggressive IV fluid hydration as clinically she is hypovolemic in the setting of multiple days of diarrhea.      7:41 AM : Pt care transferred to Dr. Ralph Leyden  ,ED provider. History of patient complaint(s), available diagnostic reports and current treatment plan has been discussed thoroughly.    Bedside rounding on patient occured : no .   Intended disposition of patient : TBD   Pending diagnostics reports and/or labs (please list): CT brain, CT abd/pelv, clinical improvement, disposition.      Dr. Mortimer Fries assistance in completion of this plan is greatly appreciated but it should be noted that I will be the provider of record for this patient.         ED Course:         ED Course as of May 11 1821       Molli Knock May 11, 2020        0656  Dr. Ralph Leyden taking over for patient care.  Patient  received 5 L of IV fluid with systolics still in the low 80s and maps greater than 65.  We'll continue  to monitor patient while awaiting CT scans for further disposition.     [DV]     [phone]  Patient CT scan of the abdomen pelvis shows extensive reticulonodular opacities in the bilateral lower lobes, cholelithiasis with mild gallbladder  wall thickening and small pericholecystic fluid.  Possible inflammatory/infectious process to the distal colon with possible partial bowel obstruction versus ileus.  Patients CT scan of the head does not show any acute intracranial abnormalities.  Due  to patient's CT scan findings will obtain right upper quadrant ultrasound for further evaluation.  Will begin empiric antibiotics and obtain blood cultures.  Patient continues to be hypothermic requiring Retail banker.  Patient will require admission following  ultrasound.     [DV]     0900  Patient's ultrasound shows cholelithiasis and possible sludge with pericholecystic fluid seen in the fundus.  Negative Murphy sign.  Patient without  gallbladder wall thickening.  Will admit patient to medicine at this time.     [DV]              ED Course User Index   [DV] Jorje Guild, DO        Critical Care Time:  The services I provided to this patient were to treat and/or prevent clinically significant deterioration that could result in the failure of one or more body systems and/or organ systems due to hypovolemic shock in the setting of diarrhea requiring multiple  liters of IVF, hypothermia requiring bare hugger.      Services included the following:   -reviewing nursing notes and old charts   -vital sign assessments   -direct patient care   -medication orders and management   -interpreting and reviewing diagnostic studies/labs   -re-evaluations   -documentation time      Aggregate critical care time was 45 minutes, which includes only time during which I was engaged in work directly related to the patient's care as described above,  whether I was at bedside or elsewhere in the Emergency Department. It did not include time  spent performing other reported procedures or the services of residents, students, nurses, or advance practice providers.      Roxsana Riding, DO   Date: 05/11/2020

## 2020-05-11 NOTE — ED Notes (Signed)
Between 0510 and 0625 4 additional 1,000 mL NS boluses were infused into patient before she was taken to CT. Upon arrival back to the ED from CT, the patient's blood pressure was 110/70.

## 2020-05-11 NOTE — ED Notes (Signed)
Attempted to give report to floor. RN to call back

## 2020-05-11 NOTE — ED Notes (Signed)
Pt transferred to room 458 via transporter. Pt stable

## 2020-05-11 NOTE — ED Notes (Signed)
Temperature WDL. Bair hugger remains on patient.

## 2020-05-11 NOTE — ED Notes (Signed)
Bair hugger placed on patient. Socks put on patient.

## 2020-05-11 NOTE — ED Notes (Signed)
Report given to Adeline RN

## 2020-05-11 NOTE — ED Notes (Signed)
Patient cleaned, changed, and placed in brief.

## 2020-05-11 NOTE — ED Notes (Signed)
Pt had BM that was contaminated with urine. Unable to send sample at this time

## 2020-05-11 NOTE — Progress Notes (Signed)
Problem: Risk for Spread of Infection  Goal: Prevent transmission of infectious organism to others  Description: Prevent the transmission of infectious organisms to other patients, staff members, and visitors.  Outcome: Progressing Towards Goal     Problem: Patient Education:  Go to Education Activity  Goal: Patient/Family Education  Outcome: Progressing Towards Goal     Problem: Falls - Risk of  Goal: *Absence of Falls  Description: Document Bridgette Habermann Fall Risk and appropriate interventions in the flowsheet.  Outcome: Progressing Towards Goal  Note: Fall Risk Interventions:            Medication Interventions: Assess postural VS orthostatic hypotension, Patient to call before getting OOB         History of Falls Interventions: Bed/chair exit alarm, Consult care management for discharge planning         Problem: Hypertension  Goal: *Blood pressure within specified parameters  Outcome: Progressing Towards Goal  Goal: *Fluid volume balance  Outcome: Progressing Towards Goal  Goal: *Labs within defined limits  Outcome: Progressing Towards Goal     Problem: Patient Education: Go to Patient Education Activity  Goal: Patient/Family Education  Outcome: Progressing Towards Goal     Problem: Diarrhea (Adult and Pediatrics)  Goal: *Absence of diarrhea  Outcome: Progressing Towards Goal  Goal: *PALLIATIVE CARE:  Absence of diarrhea  Outcome: Progressing Towards Goal     Problem: Pain  Goal: *Control of Pain  Outcome: Progressing Towards Goal  Goal: *PALLIATIVE CARE:  Alleviation of Pain  Outcome: Progressing Towards Goal     Problem: Patient Education: Go to Patient Education Activity  Goal: Patient/Family Education  Outcome: Progressing Towards Goal

## 2020-05-11 NOTE — H&P (Signed)
History and Physical    Patient: Vicki Osborne MRN: [id number]  SSN: [id number]    Date of Birth: 1970/08/03  Age: 49 y.o.  Sex: female      Unknown, Provider, MD    C/C : AMS    Subjective:      Vicki Osborne is a 49 y.o. female with past medical history of ADHD, schizophrenia, hypertension, history of's SVT s/p ablation at [facility] per patient, originally from [place] area coming to this area and deciding to be settled here.  She was admitted with altered mental status brought in by EMS.     During my interview patient is alert awake oriented and she  gives good history.  She does not remember her medications in detail so I called her pharmacy in [place] and confirmed the dosage of medications.       Patient admitted with altered mental status brought in by EMS in ED she was found to be hypothermic and hypotensive, she improved with IV hydration and warming blankets, during my interview she was alert awake oriented-she denies excessive use of any alcohol recently and denies using tobacco and other drugs, according to her she did not feel well so she called her son to call EMS, when EMS came they had to properly do sternal rub to wake her up for getting attention she remembers that very well, .  Currently she is not complaining any problems.  She is just worried why she is so tachycardic.     No past medical history on file.  No past surgical history on file.   No family history on file.  Social History     Tobacco Use   ??? Smoking status: Not on file   Substance Use Topics   ??? Alcohol use: Not on file      Prior to Admission medications    Not on File        No Known Allergies    Patient's pharmacy:  [facility] [address] [place] [address]  Phone # [phone]    Review of Systems:  positive responses in bold type   Constitutional: Negative for fever, chills, diaphoresis and unexpected weight change.   HENT: Negative for ear pain, congestion, sore throat, rhinorrhea, drooling, trouble  swallowing, neck pain and tinnitus.   Eyes: Negative for photophobia, pain, redness and visual disturbance.   Respiratory: negative for shortness of breath, cough, choking, chest tightness, wheezing or stridor.   Cardiovascular: Negative for chest pain, palpitations and leg swelling.   Gastrointestinal: Negative for nausea, vomiting, abdominal pain, diarrhea, constipation, blood in stool, abdominal distention and anal bleeding.   Genitourinary: Negative for dysuria, urgency, frequency, hematuria, flank pain and difficulty urinating.   Musculoskeletal: Negative for back pain and arthralgias.   Skin: Negative for color change, rash and wound.   Neurological: Negative for dizziness, seizures, syncope, speech difficulty, light-headedness or headaches.   Hematological: Does not bruise/bleed easily.   Psychiatric/Behavioral: Negative for suicidal ideas, hallucinations, behavioral problems, self-injury or agitation         Objective:   There is no height or weight on file to calculate BMI.  Vitals:    05/11/20 0733 05/11/20 0735 05/11/20 0741 05/11/20 1158   BP:  117/77 118/76    Pulse: 99 (!) 102 93    Resp: 17 15     Temp:   97.7 ??F (36.5 ??C) 97.7 ??F (36.5 ??C)   SpO2: 98% 100% 97%  Physical Exam:  General appearance - alert, well appearing, and in no distress  Mental status - alert, oriented to person, place, and time  Eyes - pupils equal and reactive, extraocular eye movements intact  Ears - bilateral TM's and external ear canals normal  Nose - normal and patent, no erythema, discharge or polyps  Mouth - mucous membranes moist, pharynx normal without lesions  Chest - clear to auscultation, no wheezes, rales or rhonchi, symmetric air entry  Heart - normal rate, regular rhythm, normal S1, S2, no murmurs, rubs, clicks or gallops  Abdomen - soft, nontender, nondistended, no masses or organomegaly  Neurological - alert, oriented, normal speech, no focal findings or movement disorder noted  Extremities - peripheral  pulses normal, no pedal edema, no clubbing or cyanosis          Hospital Problems  Never Reviewed        Codes Class Noted POA    Sepsis ([place]) ICD-10-CM: A41.9  ICD-9-CM: 038.9, 995.91  05/11/2020 Unknown              CBC:  Lab Results   Component Value Date/Time    WBC 4.5 (L) 05/11/2020 04:44 AM    HGB 10.7 (L) 05/11/2020 04:44 AM    HCT 32.5 (L) 05/11/2020 04:44 AM    PLATELET 205 05/11/2020 04:44 AM    MCV 100.9 (H) 05/11/2020 04:44 AM        CMP:  Lab Results   Component Value Date/Time    Sodium 138 05/11/2020 04:44 AM    Potassium 3.8 05/11/2020 04:44 AM    Chloride 107 05/11/2020 04:44 AM    CO2 19 (L) 05/11/2020 04:44 AM    Anion gap 12 05/11/2020 04:44 AM    Glucose 176 (H) 05/11/2020 04:44 AM    BUN 6 (L) 05/11/2020 04:44 AM    Creatinine 1.14 05/11/2020 04:44 AM    BUN/Creatinine ratio 5 (L) 05/11/2020 04:44 AM    GFR est AA >60 05/11/2020 04:44 AM    GFR est non-AA 50 (L) 05/11/2020 04:44 AM    Calcium 8.4 (L) 05/11/2020 04:44 AM    Alk. phosphatase 89 05/11/2020 04:44 AM    Protein, total 7.3 05/11/2020 04:44 AM    Albumin 2.9 (L) 05/11/2020 04:44 AM    Globulin 4.4 (H) 05/11/2020 04:44 AM    A-G Ratio 0.7 (L) 05/11/2020 04:44 AM    ALT (SGPT) 52 05/11/2020 04:44 AM        PT/INR  No results found for: INR, PTMR, PTP, PT1, PT2, INREXT         EKG: No results found for this or any previous visit.       Assessment And  Plan:     1 Sepsis - of unknown source - hypothermia , Hypotension , AMS   2 UTI - POA   3 Hypothermia   4 Acute metabolic encephalopathy   5 ETOH use history   6 Psych disorder- Schizophrenia / ADHD   7  HTN  8 H/o ? SVT - S/p Ablation at [facility] ( Per patient )   9 Macrocytic anemia     PLAN     -Acute encephalopathy / hypothermia/hypotension:  Initial impression was sepsis so we will continue sepsis treatment broad-spectrum antibiotic ID consultation follow cultures.    -Order urine drug test     -Patient psych medication will be restarted once QTc interval stabilizes currently it is high  so Seroquel which she is taking on very high  doses on hold.    -       Signed By: Eyvonne Left, MD     May 11, 2020

## 2020-05-11 NOTE — Consults (Signed)
Infectious Disease Consultation Note        Reason: Evaluate for sepsis-hypothermia, lactic acidosis    Current abx Prior abx   Piperacillin/tazobactam, vancomycin since 05/11/2020      Lines:       Assessment :    49 y.o. female with past medical history of ADHD, schizophrenia, hypertension, history of's SVT s/p ablation at [facility] per patient, originally from [place] area coming to this area and deciding to be settled here.  She was admitted to [facility] on 05/11/2020 with altered mental status brought in by EMS.     Now with hypothermia, tachycardia, diarrhea, reticulonodular opacities in bilateral lower lobes    Patient presents with a highly complex clinical picture.  I agree that initial presentation is concerning for bacterial sepsis.  However after obtaining detailed history/exam; no definitive source of bacterial infection identified.  Diarrhea/tachycardia/lack of leukocytosis may point out to viral gastroenteritis.  Rule out COVID-19 infection  Also recommend to rule out noninfectious etiologies such as hypothyroidism    Mild gallbladder wall thickening noted on CT abdomen-no right upper quadrant tenderness or pain.  Normal LFTs.  No definitive evidence of acute cholecystitis at this time    Recommendations:    1.  Continue piperacillin/tazobactam, vancomycin for now  2.  Add on serum TSH, procalcitonin-discussed with lab  3.  Obtain respiratory viral panel PCR with COVID-19, stool C. difficile, stool enteric pathogen panel  4.  Further recommendations based on the above test results, clinical course    Addendum 16:00  TSH-7 likely points out to undiagnosed hypothyroidism as the cause of hypothermia. Await stool studies. procalcitonin 0.18 argues against bacterial sepsis.  Will discontinue Zosyn, vancomycin and monitor off antibiotics    Thank you for consultation request. Above plan was discussed in details with patient, Rn and dr Celine Mans. Please call me if any further questions or concerns. Will continue to  participate in the care of this patient.  HPI:    49 y.o. female with past medical history of ADHD, schizophrenia, hypertension, history of's SVT s/p ablation at [facility] per patient, originally from  area coming to this area and deciding to be settled here.  She was admitted to [facility] on 05/11/2020 with altered mental status brought in by EMS.   ??  During my interview patient is alert awake oriented and she  gives good history.    Patient states that she is feeling more cold and having chills since the past 2 weeks.  She also has had some diarrhea for the past 2 to 3 days.  She has been vaccinated for COVID-19 -received 2 doses of Pfizer vaccine.     Patient admitted with altered mental status brought in by EMS in ED she was found to be hypothermic and hypotensive, she improved with IV hydration and warming blankets,  according to her she did not feel well so she called her son to call EMS, when EMS came they had to properly do sternal rub to wake her up for getting attention she remembers that very well.  She was started on broad-spectrum antibiotics.  CT chest revealed extensive reticulonodular opacities in bilateral lower lobes.  Cholelithiasis with mild gallbladder wall thickening.  Nondilated right-sided: With mild pericolonic fat stranding    No past medical history on file.    No past surgical history on file.    Patient's Medications    No medications on file       Current Facility-Administered Medications   Medication  Dose Route Frequency   ??? vancomycin (VANCOCIN) 1500 mg in NS 500 ml infusion  1,500 mg IntraVENous ONCE   ??? sodium chloride 0.9 % bolus infusion 1,000 mL  1,000 mL IntraVENous ONCE     No current outpatient medications on file.       Allergies: Patient has no known allergies.    No family history on file.  Social History     Socioeconomic History   ??? Marital status: UNKNOWN     Spouse name: Not on file   ??? Number of children: Not on file   ??? Years of education: Not on file   ???  Highest education level: Not on file   Occupational History   ??? Not on file   Tobacco Use   ??? Smoking status: Not on file   Substance and Sexual Activity   ??? Alcohol use: Not on file   ??? Drug use: Not on file   ??? Sexual activity: Not on file   Other Topics Concern   ??? Not on file   Social History Narrative   ??? Not on file     Social Determinants of Health     Financial Resource Strain:    ??? Difficulty of Paying Living Expenses:    Food Insecurity:    ??? Worried About Programme researcher, broadcasting/film/video in the Last Year:    ??? Barista in the Last Year:    Transportation Needs:    ??? Freight forwarder (Medical):    ??? Lack of Transportation (Non-Medical):    Physical Activity:    ??? Days of Exercise per Week:    ??? Minutes of Exercise per Session:    Stress:    ??? Feeling of Stress :    Social Connections:    ??? Frequency of Communication with Friends and Family:    ??? Frequency of Social Gatherings with Friends and Family:    ??? Attends Religious Services:    ??? Database administrator or Organizations:    ??? Attends Engineer, structural:    ??? Marital Status:    Intimate Programme researcher, broadcasting/film/video Violence:    ??? Fear of Current or Ex-Partner:    ??? Emotionally Abused:    ??? Physically Abused:    ??? Sexually Abused:      Social History     Tobacco Use   Smoking Status Not on file        Temp (24hrs), Avg:95.5 ??F (35.3 ??C), Min:94.2 ??F (34.6 ??C), Max:97.7 ??F (36.5 ??C)    Visit Vitals  BP 118/76   Pulse 93   Temp 97.7 ??F (36.5 ??C)   Resp 15   SpO2 97%       ROS: 12 point ROS obtained in details. Pertinent positives as mentioned in HPI,   otherwise negative    Physical Exam:    General: Well developed, well nourished female laying on the bed AAOx3 in no acute distress.    General:   awake alert and oriented   HEENT:  Normocephalic, atraumatic, EOMI, no scleral icterus or pallor; no conjunctival hemmohage;  nasal and oral mucous are moist and without evidence of lesions. Neck supple, no bruits.   Lymph Nodes:   no cervical, axillary or inguinal  adenopathy   Lungs:   non-labored, bilaterally clear to auscultation- no crackles wheezes rales or rhonchi   Heart:  RRR, s1 and s2; tachycardic, no edema   Abdomen:  soft, non-distended, active bowel sounds,  no hepatomegaly, no splenomegaly.  Non-tender   Genitourinary:  deferred   Extremities:   no clubbing, cyanosis; no joint effusions or swelling; Full ROM of all large joints to the upper and lower extremities; muscle mass appropriate for age   Neurologic:  No gross focal sensory abnormalities; 5/5 muscle strength to upper and lower extremities. Speech appropriate. Cranial nerves intact                        Skin:  No rash or ulcers noted   Back:  no spinal or paraspinal muscle tenderness or rigidity, no CVA tenderness     Psychiatric:  No suicidal or homicidal ideations, appropriate mood and affect         Labs: Results:   Chemistry Recent Labs     05/11/20  0444   GLU 176*   NA 138   K 3.8   CL 107   CO2 19*   BUN 6*   CREA 1.14   CA 8.4*   AGAP 12   BUCR 5*   AP 89   TP 7.3   ALB 2.9*   GLOB 4.4*   AGRAT 0.7*      CBC w/Diff Recent Labs     05/11/20  0444   WBC 4.5*   RBC 3.22*   HGB 10.7*   HCT 32.5*   PLT 205   GRANS 46   LYMPH 44   EOS 2      Microbiology No results for input(s): CULT in the last 72 hours.       RADIOLOGY:    All available imaging studies/reports in connect care for this admission were reviewed      Disclaimer: Sections of this note are dictated utilizing voice recognition software, which may have resulted in some phonetic based errors in grammar and contents. Even though attempts were made to correct all the mistakes, some may have been missed, and remained in the body of the document. If questions arise, please contact our department.    Dr. Raiford Simmonds, Infectious Disease Specialist  [phone]  May 11, 2020  10:21 AM

## 2020-05-12 DIAGNOSIS — T68XXXA Hypothermia, initial encounter: Secondary | ICD-10-CM

## 2020-05-12 LAB — EKG, 12 LEAD, SUBSEQUENT
Atrial Rate: 95 {beats}/min
Calculated P Axis: 50 degrees
Calculated R Axis: 38 degrees
Calculated T Axis: -22 degrees
Diagnosis: NORMAL
P-R Interval: 172 ms
Q-T Interval: 396 ms
QRS Duration: 90 ms
QTC Calculation (Bezet): 497 ms
Ventricular Rate: 95 {beats}/min

## 2020-05-12 LAB — VITAMIN B12 & FOLATE
Folate: 19 ng/mL — ABNORMAL HIGH (ref 3.10–17.50)
Folate: 19 ng/mL — ABNORMAL HIGH (ref 3.10–17.50)
Vitamin B-12: 332 pg/mL (ref 211–911)
Vitamin B12: 332 pg/mL (ref 211–911)

## 2020-05-12 LAB — METABOLIC PANEL, BASIC
Anion gap: 5 mmol/L (ref 3.0–18)
BUN/Creatinine ratio: 4 — ABNORMAL LOW (ref 12–20)
BUN: 3 MG/DL — ABNORMAL LOW (ref 7.0–18)
CO2: 23 mmol/L (ref 21–32)
Calcium: 8.2 MG/DL — ABNORMAL LOW (ref 8.5–10.1)
Chloride: 116 mmol/L — ABNORMAL HIGH (ref 100–111)
Creatinine: 0.81 MG/DL (ref 0.6–1.3)
GFR est AA: >60 mL/min/{1.73_m2} (ref >60)
GFR est non-AA: >60 mL/min/{1.73_m2} (ref >60)
Glucose: 98 mg/dL (ref 74–99)
Potassium: 2.9 mmol/L — CL (ref 3.5–5.5)
Sodium: 144 mmol/L (ref 136–145)

## 2020-05-12 LAB — CBC WITH AUTOMATED DIFF
ABS. BASOPHILS: 0 10*3/uL (ref 0.0–0.1)
ABS. EOSINOPHILS: 0.1 10*3/uL (ref 0.0–0.4)
ABS. LYMPHOCYTES: 1.2 10*3/uL (ref 0.9–3.6)
ABS. MONOCYTES: 0.3 10*3/uL (ref 0.05–1.2)
ABS. NEUTROPHILS: 3.2 10*3/uL (ref 1.8–8.0)
BASOPHILS: 0 % (ref 0–2)
EOSINOPHILS: 1 % (ref 0–5)
HCT: 29.1 % — ABNORMAL LOW (ref 35.0–45.0)
HGB: 9.9 g/dL — ABNORMAL LOW (ref 12.0–16.0)
LYMPHOCYTES: 24 % (ref 21–52)
MCH: 34 PG (ref 24.0–34.0)
MCHC: 34 g/dL (ref 31.0–37.0)
MCV: 100 FL (ref 78.0–100.0)
MONOCYTES: 7 % (ref 3–10)
MPV: 9.1 FL — ABNORMAL LOW (ref 9.2–11.8)
NEUTROPHILS: 67 % (ref 40–73)
PLATELET: 200 10*3/uL (ref 135–420)
RBC: 2.91 M/uL — ABNORMAL LOW (ref 4.20–5.30)
RDW: 14.2 % (ref 11.6–14.5)
WBC: 4.9 10*3/uL (ref 4.6–13.2)

## 2020-05-12 LAB — POTASSIUM
Potassium: 3.2 mmol/L — ABNORMAL LOW (ref 3.5–5.5)
Potassium: 3.2 mmol/L — ABNORMAL LOW (ref 3.5–5.5)

## 2020-05-12 LAB — C. DIFFICILE AG & TOXIN A/B
C. DIFFICILE TOXIN: NEGATIVE
C. difficile toxin: NEGATIVE
GDH ANTIGEN: NEGATIVE
GDH Antigen: NEGATIVE
INTERPRETATION: NEGATIVE
INTERPRETATION: NEGATIVE

## 2020-05-12 LAB — CBC WITH AUTO DIFFERENTIAL
Basophils %: 0 % (ref 0–2)
Basophils Absolute: 0 10*3/uL (ref 0.0–0.1)
Eosinophils %: 1 % (ref 0–5)
Eosinophils Absolute: 0.1 10*3/uL (ref 0.0–0.4)
Hematocrit: 29.1 % — ABNORMAL LOW (ref 35.0–45.0)
Hemoglobin: 9.9 g/dL — ABNORMAL LOW (ref 12.0–16.0)
Lymphocytes %: 24 % (ref 21–52)
Lymphocytes Absolute: 1.2 10*3/uL (ref 0.9–3.6)
MCH: 34 PG (ref 24.0–34.0)
MCHC: 34 g/dL (ref 31.0–37.0)
MCV: 100 FL (ref 78.0–100.0)
MPV: 9.1 FL — ABNORMAL LOW (ref 9.2–11.8)
Monocytes %: 7 % (ref 3–10)
Monocytes Absolute: 0.3 10*3/uL (ref 0.05–1.2)
Neutrophils %: 67 % (ref 40–73)
Neutrophils Absolute: 3.2 10*3/uL (ref 1.8–8.0)
Platelets: 200 10*3/uL (ref 135–420)
RBC: 2.91 M/uL — ABNORMAL LOW (ref 4.20–5.30)
RDW: 14.2 % (ref 11.6–14.5)
WBC: 4.9 10*3/uL (ref 4.6–13.2)

## 2020-05-12 LAB — EKG 12-LEAD
Atrial Rate: 95 {beats}/min
Diagnosis: NORMAL
P Axis: 50 degrees
P-R Interval: 172 ms
Q-T Interval: 396 ms
QRS Duration: 90 ms
QTc Calculation (Bazett): 497 ms
R Axis: 38 degrees
T Axis: -22 degrees
Ventricular Rate: 95 {beats}/min

## 2020-05-12 LAB — BASIC METABOLIC PANEL
Anion Gap: 5 mmol/L (ref 3.0–18)
BUN: 3 MG/DL — ABNORMAL LOW (ref 7.0–18)
Bun/Cre Ratio: 4 — ABNORMAL LOW (ref 12–20)
CO2: 23 mmol/L (ref 21–32)
Calcium: 8.2 MG/DL — ABNORMAL LOW (ref 8.5–10.1)
Chloride: 116 mmol/L — ABNORMAL HIGH (ref 100–111)
Creatinine: 0.81 MG/DL (ref 0.6–1.3)
EGFR IF NonAfrican American: >60 mL/min/{1.73_m2} (ref >60)
GFR African American: >60 mL/min/{1.73_m2} (ref >60)
Glucose: 98 mg/dL (ref 74–99)
Potassium: 2.9 mmol/L — CL (ref 3.5–5.5)
Sodium: 144 mmol/L (ref 136–145)

## 2020-05-12 MED ORDER — FLU VACCINE QS 2021-22 (6MOS+)(PF) 60 MCG(15 MCGX4)/0.5 ML IM SYRINGE
60 mcg (15 mcg x 4)/0.5 mL | INTRAMUSCULAR | Status: DC
Start: 2020-05-12 — End: 2020-05-12

## 2020-05-12 MED ORDER — PIPERACILLIN-TAZOBACTAM 3.375 GRAM IV SOLR
3.375 gram | Freq: Four times a day (QID) | INTRAVENOUS | Status: DC
Start: 2020-05-12 — End: 2020-05-12
  Administered 2020-05-12 (×3): via INTRAVENOUS

## 2020-05-12 MED ORDER — QUETIAPINE 25 MG TAB
25 mg | Freq: Two times a day (BID) | ORAL | Status: DC
Start: 2020-05-12 — End: 2020-05-12

## 2020-05-12 MED ORDER — CYANOCOBALAMIN 1,000 MCG/ML IJ SOLN
1000 mcg/mL | Freq: Every day | INTRAMUSCULAR | Status: DC
Start: 2020-05-12 — End: 2020-05-12
  Administered 2020-05-12: 18:00:00 via INTRAMUSCULAR

## 2020-05-12 MED ORDER — VIAL-MATE ADAPTOR
750 mg | Freq: Two times a day (BID) | Status: DC
Start: 2020-05-12 — End: 2020-05-12
  Administered 2020-05-12: 01:00:00 via INTRAVENOUS

## 2020-05-12 MED ORDER — POTASSIUM CHLORIDE 10 MEQ/100 ML IV PIGGY BACK
10 mEq/0 mL | INTRAVENOUS | Status: AC
Start: 2020-05-12 — End: 2020-05-12
  Administered 2020-05-12 (×4): via INTRAVENOUS

## 2020-05-12 MED ORDER — LEVOTHYROXINE 25 MCG TAB
25 mcg | ORAL | Status: DC
Start: 2020-05-12 — End: 2020-05-12

## 2020-05-12 MED FILL — BENZTROPINE 1 MG TAB: 1 mg | ORAL | Qty: 1

## 2020-05-12 MED FILL — CARVEDILOL 6.25 MG TAB: 6.25 mg | ORAL | Qty: 1

## 2020-05-12 MED FILL — PIPERACILLIN-TAZOBACTAM 3.375 GRAM IV SOLR: 3.375 gram | INTRAVENOUS | Qty: 3.38

## 2020-05-12 MED FILL — PANTOPRAZOLE 40 MG TAB, DELAYED RELEASE: 40 mg | ORAL | Qty: 1

## 2020-05-12 MED FILL — POTASSIUM CHLORIDE 10 MEQ/100 ML IV PIGGY BACK: 10 mEq/0 mL | INTRAVENOUS | Qty: 100

## 2020-05-12 MED FILL — FOLIC ACID 1 MG TAB: 1 mg | ORAL | Qty: 1

## 2020-05-12 MED FILL — GABAPENTIN 300 MG CAP: 300 mg | ORAL | Qty: 1

## 2020-05-12 MED FILL — LITHIUM CARBONATE 300 MG CAP: 300 mg | ORAL | Qty: 2

## 2020-05-12 MED FILL — CYANOCOBALAMIN 1,000 MCG/ML IJ SOLN: 1000 mcg/mL | INTRAMUSCULAR | Qty: 1

## 2020-05-12 MED FILL — LOVENOX 40 MG/0.4 ML SUBCUTANEOUS SYRINGE: 40 mg/0.4 mL | SUBCUTANEOUS | Qty: 0.4

## 2020-05-12 MED FILL — VANCOMYCIN 750 MG IV SOLUTION: 750 mg | INTRAVENOUS | Qty: 750

## 2020-05-12 MED FILL — THIAMINE HCL 100 MG TAB: 100 mg | ORAL | Qty: 1

## 2020-05-12 MED FILL — PRAZOSIN 1 MG CAP: 1 mg | ORAL | Qty: 4

## 2020-05-12 MED FILL — LITHIUM CARBONATE 300 MG TAB: 300 mg | ORAL | Qty: 1

## 2020-05-12 NOTE — Progress Notes (Signed)
3 IV sites removed. ID removed and shredded.

## 2020-05-12 NOTE — Discharge Summary (Signed)
Discharge Summary     Patient ID:  Vicki Osborne  [id number]  49 y.o.  1971/03/15  Body mass index is 24.6 kg/m??.  PCP on record: Unknown, Provider, MD    Admit date: 05/11/2020  Discharge date and time: 05/12/2020    Discharge Diagnoses:                                           1.  Metabolic encephalopathy  ??  2 hypothermia  ??  3 lactic acidosis  ??  4 psych disorder-ADHD, schizophrenia  ??  5 sepsis on admission suspected ruled out  ??  6 UTI present on admission-asymptomatic  ??  7 EtOH use history  ??  8 hypertension  ??  9 history of SVT s/p ablation at [facility]  ??  10  microcytic anemia -low normal B12-on replacement      Consults: ID          Hospital Course by problems:    - HPI : 49 year old African-American female from [place] recently moved here, was brought to the emergency room with altered mental status, today she is more alert awake oriented using bathroom walking around in the room asymptomatic.  ??  Per patient she went to bathroom and while she came out she was not feeling well she felt suddenly weak fell down hit her head near her son called EMS who brought the patient to the emergency room.  Initial story was patient may be using excessive alcohol use, today when patient is more alert awake she said that she drinks alcohol but she was not drinking that time.  Patient denies any chest pain palpitations shortness of breath cough expectoration or any other symptomatology around that fall.  Patient's CT head was unremarkable for any acute bleeding etc.  Initially patient was started on empiric antibiotic which was stopped by ID as there was no evidence of infection, patient was hypothermic hypothermia improved with warm blankets IV fluids improved her blood pressure.   ??  She is on multiple psych medications-for ADHD and schizophrenia per patient, her psychiatry is in [place], I called [employer] and confirm her dosage.  Patient has a prolonged QTc interval, Seroquel was held  repeat EKG suggested some improvement in her QTC, lower dose of Seroquel started to prevent any withdrawal.       -Mild morning discussion with the patient-patient agreed to stay till successfully QTc interval can be corrected and patient can be restarted on psych medications successfully however she decided to go anyway in the evening sign out AMA        Patient seen and examined by me on discharge day.  Pertinent Findings:  Stable    Significant Diagnostic Studies:  Results  CT ABD PELV W CONT (Accession [id number]) (Order [id number])  Allergies??    No Known Allergies   Exam Information    Status Exam Begun  Exam Ended    Final [99] 05/11/2020 06:40 05/11/2020 ??6:54 AM [id number] ??6:54 AM   Result Information    Status: Final result (Exam End: 05/11/2020 06:54) Provider Status: Open   Study Result    Narrative & Impression   CT of abdomen and pelvis with contrast   ??  INDICATION: Abdominal pain  ??  COMPARISON: None.  ??  TECHNIQUE: 5 mm helical scan to the abdomen and pelvis is obtained  from the  diaphragm to the symphysis pubis after uneventful nonionic IV contrast  administration.  ??  All CT scans at this facility performed using dose optimization techniques as  appreciated to a performed exam, to include automated exposure control,  adjustment of the mA and or KU according to patient size (including appropriate  matching for site specific examination), or use of iterative reconstruction  technique.  ??  FINDINGS:   ??  Lung Bases: Mild reticular nodular opacities identified in the posterior lung  bases, partially seen.  ??  Liver: Moderate hepatic steatosis.  Gallbladder: Multiple gallstones layering at the dependent portion of the  gallbladder with borderline wall bladder wall thickening and small  pericholecystic fluid.   Biliary System: No ductal dilatation.  Spleen: Normal.  Pancreas: Normal.  Bowel: Small to moderate hiatal hernia present. The ileum is fluid-filled with  upper normal caliber compared to the  jejunum.  The appendix is not clearly seen.  There is fluid-filled upper normal caliber proximal colon from cecum to the  distal transverse colon with featureless appearance and mild mural thickening  with surrounding mild fatty stranding. The transit point is seen at distal  transverse colon with collapsed lumen of the remaining distal colon.  ??  Adrenal Glands: Normal.  Kidneys: Normal.  Lower genitourinary system: The bladder is is mildly distended. The uterus  appears unremarkable. No adnexal mass.   ??  Peritoneum/Retroperitoneum: No adenopathy.  Vasculature: Unremarkable for age.   Other:  No free fluid.  ??  CT OSSEOUS STRUCTURES:     ??  Unremarkable for age.  ??  IMPRESSION  ??  1. Extensive reticulonodular opacities partially seen in bilateral lower lobes  which could represent acute or chronic interstitial pneumonitis. Comment  clinical correlation and chest CT for full evaluation if indicated.  2. Cholelithiasis with mild gallbladder wall thickening and small  pericholecystic fluid. Recommend gallbladder ultrasound for better evaluation of  potential cholecystitis.  3. Fluid-filled nondilated right-sided colon with mild mural thickening and mild  pericolonic fatty stranding which rapidly tapers at distal transverse colon with  collapsed lumen of remaining distal colon. Fluid-filled upper normal caliber  ileum also seen. The finding raises possibility of mild inflammatory/infectious  process with early partial bowel obstruction versus ileus.  4. Moderate hepatic steatosis.  ??  Thank you for this referral.          Pertinent Lab Data:  Recent Labs     05/12/20  0235 05/11/20  0444   WBC 4.9 4.5*   HGB 9.9* 10.7*   HCT 29.1* 32.5*   PLT 200 205     Recent Labs     05/12/20  1240 05/12/20  0235 05/11/20  0444   NA  --  144 138   K 3.2* 2.9* 3.8   CL  --  116* 107   CO2  --  23 19*   GLU  --  98 176*   BUN  --  3* 6*   CREA  --  0.81 1.14   CA  --  8.2* 8.4*   MG  --   --  2.1   ALB  --   --  2.9*   ALT  --   --   52       DISCHARGE MEDICATIONS:   @  Current Discharge Medication List            My Recommended Diet, Activity, Wound Care, and follow-up labs are listed in the patient's Discharge Insturctions which  I have personally completed and reviewed.      Disposition:     [x]  AMA  Condition at Discharge:  Stable    Follow up with:   PCP : Unknown, Provider, MD      Please follow-up tests/labs that are still pending:  1. None  2.    >30 minutes spent coordinating this discharge (review instructions/follow-up, prescriptions, preparing report for sign off)    Disclaimer: Sections of this note are dictated utilizing voice recognition software, which may have resulted in some phonetic based errors in grammar and contents. Even though attempts were made to correct all the mistakes, some may have been missed, and remained in the body of the document. If questions arise, please contact our department.    Signed:  , MD

## 2020-05-12 NOTE — Progress Notes (Signed)
Problem: Risk for Spread of Infection  Goal: Prevent transmission of infectious organism to others  Description: Prevent the transmission of infectious organisms to other patients, staff members, and visitors.  Outcome: Progressing Towards Goal     Problem: Falls - Risk of  Goal: *Absence of Falls  Description: Document Vicki Osborne Fall Risk and appropriate interventions in the flowsheet.  Outcome: Progressing Towards Goal  Note: Fall Risk Interventions:            Medication Interventions: Assess postural VS orthostatic hypotension, Patient to call before getting OOB         History of Falls Interventions: Bed/chair exit alarm         Problem: Diarrhea (Adult and Pediatrics)  Goal: *Absence of diarrhea  Outcome: Progressing Towards Goal     Problem: Pressure Injury - Risk of  Goal: *Prevention of pressure injury  Description: Document Braden Scale and appropriate interventions in the flowsheet.  Outcome: Progressing Towards Goal  Note: Pressure Injury Interventions:            Activity Interventions: Assess need for specialty bed, Pressure redistribution bed/mattress(bed type)         Nutrition Interventions: Document food/fluid/supplement intake                     Problem: Pain  Goal: *Control of Pain  Outcome: Progressing Towards Goal

## 2020-05-12 NOTE — Progress Notes (Signed)
[facility] Hospitalist Group  Progress Note    Patient: Vicki Osborne Age: 49 y.o. DOB: Aug 31, 1970 MR#: [id number] SSN: [id number]  Date/Time: 05/12/2020           C/C: Altered mental status      Subjective:   HPI : 49 year old African-American female from [place] recently moved here, was brought to the emergency room with altered mental status, today she is more alert awake oriented using bathroom walking around in the room asymptomatic.    Per patient she went to bathroom and while she came out she was not feeling well she felt suddenly weak fell down hit her head near her son called EMS who brought the patient to the emergency room.  Initial story was patient may be using excessive alcohol use, today when patient is more alert awake she said that she drinks alcohol but she was not drinking that time.  Patient denies any chest pain palpitations shortness of breath cough expectoration or any other symptomatology around that fall.  Patient's CT head was unremarkable for any acute bleeding etc.  Initially patient was started on empiric antibiotic which was stopped by ID as there was no evidence of infection, patient was hypothermic hypothermia improved with warm blankets IV fluids improved her blood pressure.     She is on multiple psych medications-for ADHD and schizophrenia per patient, her psychiatry is in [place], I called [employer] and confirm her dosage.  Patient has a prolonged QTc interval, Seroquel was held repeat EKG suggested some improvement in her QTC, lower dose of Seroquel started to prevent any withdrawal.   Acute    Review of Systems:  positive responses in bold type   Constitutional: Negative for fever, chills, diaphoresis and unexpected weight change.   HENT: Negative for ear pain, congestion, sore throat, rhinorrhea, drooling, trouble swallowing, neck pain and tinnitus.   Eyes: Negative for photophobia, pain, redness and visual disturbance.   Respiratory: negative  for shortness of breath, cough, choking, chest tightness, wheezing or stridor.   Cardiovascular: Negative for chest pain, palpitations and leg swelling.   Gastrointestinal: Negative for nausea, vomiting, abdominal pain, diarrhea, constipation, blood in stool, abdominal distention and anal bleeding.   Genitourinary: Negative for dysuria, urgency, frequency, hematuria, flank pain and difficulty urinating.   Musculoskeletal: Negative for back pain and arthralgias.   Skin: Negative for color change, rash and wound.   Neurological: Negative for dizziness, seizures, syncope, speech difficulty, light-headedness or headaches.   Hematological: Does not bruise/bleed easily.   Psychiatric/Behavioral: Negative for suicidal ideas, hallucinations, behavioral problems, self-injury or agitation     Assessment/Plan:     1.  Metabolic encephalopathy    2 hypothermia    3 lactic acidosis    4 psych disorder-ADHD, schizophrenia    5 sepsis on admission suspected ruled out    6 UTI present on admission-asymptomatic    7 EtOH use history    8 hypertension    9 history of SVT s/p ablation at [facility]    1 microcytic anemia -low normal B12-on replacement    Plan     please review above-mentioned HPI    Patient is getting better I will repeat EKG, once QTC interval stabilized restart her medications for psychiatric illness.  Patient follow-up with her psychiatrist which in turn is midline and patient is planning to go there    Objective:       General:  Alert, cooperative, no acute distress   HEENT: No facial  asymmetry, PERL Bil, External ears - WNL    Cardiovascular: S1S2 - regular , No Murmur   Pulmonary: Equal expansion , No Use of accessory muscles , No Rales No Rhonchi    GI:  +BS in all four quadrants, soft, non-tender  Extremities:  No edema; 2+ dorsalis pedis pulses bilaterally  Neuro: Alert and oriented X 2.       DVT Prophylaxis:  [x] Lovenox  [] Hep SQ  [] SCDs [] Coumadin   [] On Heparin gtt    []  Eliquis []  Xarelto      Vitals:         VS:   Visit Vitals  BP (!) 169/98 (BP 1 Location: Left upper arm, BP Patient Position: Semi fowlers;Lying)   Pulse 73   Temp 97.8 ??F (36.6 ??C)   Resp 18   Ht 5\' 6"  (1.676 m)   Wt 69.1 kg (152 lb 6.4 oz)   SpO2 99%   BMI 24.60 kg/m??      Tmax/24hrs: Temp (24hrs), Avg:98.7 ??F (37.1 ??C), Min:97.8 ??F (36.6 ??C), Max:100.2 ??F (37.9 ??C)  IOBRIEFNo intake or output data in the 24 hours ending 05/12/20 1510      Medications:   Current Facility-Administered Medications   Medication Dose Route Frequency   ??? cyanocobalamin (VITAMIN B12) injection 1,000 mcg  1,000 mcg IntraMUSCular DAILY   ??? [START ON 05/13/2020] levothyroxine (SYNTHROID) tablet 25 mcg  25 mcg Oral 6am   ??? QUEtiapine (SEROquel) tablet 100 mg  100 mg Oral BID   ??? lithium carbonate tablet 300 mg  300 mg Oral QAM   ??? lithium carbonate capsule 600 mg  600 mg Oral QHS   ??? gabapentin (NEURONTIN) capsule 300 mg  300 mg Oral TID   ??? [Held by provider] QUEtiapine (SEROquel) tablet 400 mg  400 mg Oral QHS   ??? [Held by provider] QUEtiapine (SEROquel) tablet 100 mg  100 mg Oral DAILY AFTER BREAKFAST   ??? prazosin (MINIPRESS) capsule 4 mg  4 mg Oral QHS   ??? benztropine (COGENTIN) tablet 1 mg  1 mg Oral BID   ??? carvediloL (COREG) tablet 6.25 mg  6.25 mg Oral BID WITH MEALS   ??? pantoprazole (PROTONIX) tablet 40 mg  40 mg Oral ACB   ??? sodium chloride (NS) flush 5-40 mL  5-40 mL IntraVENous Q8H   ??? sodium chloride (NS) flush 5-40 mL  5-40 mL IntraVENous PRN   ??? acetaminophen (TYLENOL) tablet 650 mg  650 mg Oral Q6H PRN    Or   ??? acetaminophen (TYLENOL) suppository 650 mg  650 mg Rectal Q6H PRN   ??? polyethylene glycol (MIRALAX) packet 17 g  17 g Oral DAILY PRN   ??? promethazine (PHENERGAN) tablet 12.5 mg  12.5 mg Oral Q6H PRN    Or   ??? ondansetron (ZOFRAN) injection 4 mg  4 mg IntraVENous Q6H PRN   ??? enoxaparin (LOVENOX) injection 40 mg  40 mg SubCUTAneous DAILY   ??? thiamine HCL (B-1) tablet 100 mg  100 mg Oral DAILY   ??? folic acid (FOLVITE) tablet 1 mg  1 mg  Oral DAILY   ??? piperacillin-tazobactam (ZOSYN) 3.375 g in 0.9% sodium chloride (MBP/ADV) 100 mL MBP  3.375 g IntraVENous Q6H   ??? influenza vaccine 2021-22 (6 mos+)(PF) (FLUARIX/FLULAVAL/FLUZONE QUAD) injection 0.5 mL  1 Each IntraMUSCular PRIOR TO DISCHARGE       Labs:    Recent Labs     05/12/20  0235 05/11/20  0444   WBC 4.9 4.5*  HGB 9.9* 10.7*   HCT 29.1* 32.5*   PLT 200 205     Recent Labs     05/12/20  1240 05/12/20  0235 05/11/20  0444   NA  --  144 138   K 3.2* 2.9* 3.8   CL  --  116* 107   CO2  --  23 19*   GLU  --  98 176*   BUN  --  3* 6*   CREA  --  0.81 1.14   CA  --  8.2* 8.4*   MG  --   --  2.1   ALB  --   --  2.9*   ALT  --   --  52         Time spent on direct patient care >30 mints     Complexity : High complex - due to multiple medical issues outlined above.     CODE Status : Full code    Case discussed with:  [x] Patient  []  Family  [] Nursing  [] Case Management       Disclaimer: Sections of this note are dictated utilizing voice recognition software, which may have resulted in some phonetic based errors in grammar and contents. Even though attempts were made to correct all the mistakes, some may have been missed, and remained in the body of the document. If questions arise, please contact our department.    Signed By: , MD     May 12, 2020

## 2020-05-12 NOTE — Progress Notes (Signed)
Infectious Disease progress Note        Reason: Evaluate for sepsis-hypothermia, lactic acidosis    Current abx Prior abx   Piperacillin/tazobactam, vancomycin since 05/11/2020      Lines:       Assessment :    49 y.o. female with past medical history of ADHD, schizophrenia, hypertension, history of's SVT s/p ablation at [facility] per patient, originally from [place] area coming to this area and deciding to be settled here.  She was admitted to [facility] on 05/11/2020 with altered mental status brought in by EMS.     Now with hypothermia, tachycardia, diarrhea, reticulonodular opacities in bilateral lower lobes    Patient presents with a highly complex clinical picture.  I agree that initial presentation is concerning for bacterial sepsis.  However after obtaining detailed history/exam; no definitive source of bacterial infection identified.    Diarrhea/tachycardia/lack of leukocytosis may point out to viral gastroenteritis.  Negative  COVID-19 testing    TSH-7 likely points out to undiagnosed hypothyroidism as the cause of hypothermia.    Mild gallbladder wall thickening noted on CT abdomen-no right upper quadrant tenderness or pain.  Normal LFTs.  No definitive evidence of acute cholecystitis at this time    Clinically better off antibiotics.  Resolved diarrhea    Recommendations:    1.  Hold further antibiotics  2.  Management of abnormal TSH per primary team    Will sign off.  Please call if any new questions or concerns.  Thanks    D/w dr. Celine Mans, patient.     HPI:    Feels better.  Resolved diarrhea.  Denies subjective fever or chills.  Wondering when she can go home    No past medical history on file.    No past surgical history on file.    Current Discharge Medication List      CONTINUE these medications which have NOT CHANGED    Details   gabapentin (NEURONTIN) 300 mg capsule Take 300 mg by mouth Every morning.      gabapentin (NEURONTIN) 600 mg tablet Take 600 mg by mouth nightly as needed.      !! lithium  carbonate 300 mg capsule Take 300 mg by mouth Every morning.      !! lithium carbonate 600 mg capsule Take 600 mg by mouth nightly as needed.      prazosin (MINIPRESS) 2 mg capsule Take 2 mg by mouth nightly.      benztropine (COGENTIN) 2 mg tablet Take 2 mg by mouth two (2) times a day.      carvediloL (COREG) 6.25 mg tablet Take 6.25 mg by mouth two (2) times daily (with meals).      lisinopriL (PRINIVIL, ZESTRIL) 10 mg tablet Take 5 mg by mouth as needed.      !! QUEtiapine (SEROqueL) 100 mg tablet Take 400 mg by mouth nightly.      !! QUEtiapine (SEROqueL) 100 mg tablet Take 100 mg by mouth Every morning.       !! - Potential duplicate medications found. Please discuss with provider.          Current Facility-Administered Medications   Medication Dose Route Frequency   ??? lithium carbonate tablet 300 mg  300 mg Oral QAM   ??? lithium carbonate capsule 600 mg  600 mg Oral QHS   ??? gabapentin (NEURONTIN) capsule 300 mg  300 mg Oral TID   ??? [Held by provider] QUEtiapine (SEROquel) tablet 400 mg  400 mg  Oral QHS   ??? [Held by provider] QUEtiapine (SEROquel) tablet 100 mg  100 mg Oral DAILY AFTER BREAKFAST   ??? prazosin (MINIPRESS) capsule 4 mg  4 mg Oral QHS   ??? benztropine (COGENTIN) tablet 1 mg  1 mg Oral BID   ??? carvediloL (COREG) tablet 6.25 mg  6.25 mg Oral BID WITH MEALS   ??? pantoprazole (PROTONIX) tablet 40 mg  40 mg Oral ACB   ??? sodium chloride (NS) flush 5-40 mL  5-40 mL IntraVENous Q8H   ??? sodium chloride (NS) flush 5-40 mL  5-40 mL IntraVENous PRN   ??? acetaminophen (TYLENOL) tablet 650 mg  650 mg Oral Q6H PRN    Or   ??? acetaminophen (TYLENOL) suppository 650 mg  650 mg Rectal Q6H PRN   ??? polyethylene glycol (MIRALAX) packet 17 g  17 g Oral DAILY PRN   ??? promethazine (PHENERGAN) tablet 12.5 mg  12.5 mg Oral Q6H PRN    Or   ??? ondansetron (ZOFRAN) injection 4 mg  4 mg IntraVENous Q6H PRN   ??? enoxaparin (LOVENOX) injection 40 mg  40 mg SubCUTAneous DAILY   ??? thiamine HCL (B-1) tablet 100 mg  100 mg Oral DAILY   ???  folic acid (FOLVITE) tablet 1 mg  1 mg Oral DAILY   ??? piperacillin-tazobactam (ZOSYN) 3.375 g in 0.9% sodium chloride (MBP/ADV) 100 mL MBP  3.375 g IntraVENous Q6H   ??? influenza vaccine 2021-22 (6 mos+)(PF) (FLUARIX/FLULAVAL/FLUZONE QUAD) injection 0.5 mL  1 Each IntraMUSCular PRIOR TO DISCHARGE       Allergies: Patient has no known allergies.    No family history on file.  Social History     Socioeconomic History   ??? Marital status: UNKNOWN     Spouse name: Not on file   ??? Number of children: Not on file   ??? Years of education: Not on file   ??? Highest education level: Not on file   Occupational History   ??? Not on file   Tobacco Use   ??? Smoking status: Not on file   Substance and Sexual Activity   ??? Alcohol use: Not on file   ??? Drug use: Not on file   ??? Sexual activity: Not on file   Other Topics Concern   ??? Not on file   Social History Narrative   ??? Not on file     Social Determinants of Health     Financial Resource Strain:    ??? Difficulty of Paying Living Expenses:    Food Insecurity:    ??? Worried About Programme researcher, broadcasting/film/video in the Last Year:    ??? Barista in the Last Year:    Transportation Needs:    ??? Freight forwarder (Medical):    ??? Lack of Transportation (Non-Medical):    Physical Activity:    ??? Days of Exercise per Week:    ??? Minutes of Exercise per Session:    Stress:    ??? Feeling of Stress :    Social Connections:    ??? Frequency of Communication with Friends and Family:    ??? Frequency of Social Gatherings with Friends and Family:    ??? Attends Religious Services:    ??? Database administrator or Organizations:    ??? Attends Engineer, structural:    ??? Marital Status:    Intimate Programme researcher, broadcasting/film/video Violence:    ??? Fear of Current or Ex-Partner:    ??? Emotionally Abused:    ???  Physically Abused:    ??? Sexually Abused:      Social History     Tobacco Use   Smoking Status Not on file        Temp (24hrs), Avg:98.7 ??F (37.1 ??C), Min:97.7 ??F (36.5 ??C), Max:100.2 ??F (37.9 ??C)    Visit Vitals  BP (!) 165/99 (BP 1  Location: Left upper arm, BP Patient Position: Lying)   Pulse 75   Temp 99.2 ??F (37.3 ??C)   Resp 18   Ht 5\' 6"  (1.676 m)   Wt 69.1 kg (152 lb 6.4 oz)   SpO2 100%   BMI 24.60 kg/m??       ROS: 12 point ROS obtained in details. Pertinent positives as mentioned in HPI,   otherwise negative    Physical Exam:    General: Well developed, well nourished female laying on the bed AAOx3 in no acute distress.    General:   awake alert and oriented   HEENT:  Normocephalic, atraumatic, EOMI, no scleral icterus or pallor; no conjunctival hemmohage;  nasal and oral mucous are moist and without evidence of lesions. Neck supple, no bruits.   Lymph Nodes:   no cervical, axillary or inguinal adenopathy   Lungs:   non-labored, bilaterally clear to auscultation- no crackles wheezes rales or rhonchi   Heart:  RRR, s1 and s2; tachycardic, no edema   Abdomen:  soft, non-distended, active bowel sounds, no hepatomegaly, no splenomegaly.  Non-tender   Genitourinary:  deferred   Extremities:   no clubbing, cyanosis; no joint effusions or swelling; Full ROM of all large joints to the upper and lower extremities; muscle mass appropriate for age   Neurologic:  No gross focal sensory abnormalities; 5/5 muscle strength to upper and lower extremities. Speech appropriate. Cranial nerves intact                        Skin:  No rash or ulcers noted   Back:  no spinal or paraspinal muscle tenderness or rigidity, no CVA tenderness     Psychiatric:  No suicidal or homicidal ideations, appropriate mood and affect         Labs: Results:   Chemistry Recent Labs     05/12/20  0235 05/11/20  0444   GLU 98 176*   NA 144 138   K 2.9* 3.8   CL 116* 107   CO2 23 19*   BUN 3* 6*   CREA 0.81 1.14   CA 8.2* 8.4*   AGAP 5 12   BUCR 4* 5*   AP  --  89   TP  --  7.3   ALB  --  2.9*   GLOB  --  4.4*   AGRAT  --  0.7*      CBC w/Diff Recent Labs     05/12/20  0235 05/11/20  0444   WBC 4.9 4.5*   RBC 2.91* 3.22*   HGB 9.9* 10.7*   HCT 29.1* 32.5*   PLT 200 205   GRANS 67 46    LYMPH 24 44   EOS 1 2      Microbiology Recent Labs     05/11/20  0910 05/11/20  0905   CULT NO GROWTH AFTER 21 HOURS NO GROWTH AFTER 21 HOURS          RADIOLOGY:    All available imaging studies/reports in connect care for this admission were reviewed      Disclaimer: Sections  of this note are dictated utilizing voice recognition software, which may have resulted in some phonetic based errors in grammar and contents. Even though attempts were made to correct all the mistakes, some may have been missed, and remained in the body of the document. If questions arise, please contact our department.    Dr. Raiford SimmondsManali Marzell Isakson, Infectious Disease Specialist  [phone]  May 12, 2020  10:21 AM

## 2020-05-13 LAB — EKG, 12 LEAD, SUBSEQUENT
Atrial Rate: 63 {beats}/min
Atrial Rate: 63 {beats}/min
Calculated P Axis: 38 degrees
Calculated P Axis: 42 degrees
Calculated R Axis: 29 degrees
Calculated R Axis: 30 degrees
Calculated T Axis: -8 degrees
Calculated T Axis: 12 degrees
Diagnosis: NORMAL
Diagnosis: NORMAL
P-R Interval: 142 ms
P-R Interval: 142 ms
Q-T Interval: 414 ms
Q-T Interval: 444 ms
QRS Duration: 88 ms
QRS Duration: 88 ms
QTC Calculation (Bezet): 423 ms
QTC Calculation (Bezet): 454 ms
Ventricular Rate: 63 {beats}/min
Ventricular Rate: 63 {beats}/min

## 2020-05-13 LAB — ENTERIC BACTERIA PANEL, DNA
CAMPYLOBACTER SPECIES, DNA: NEGATIVE
Campylobacter species, DNA: NEGATIVE
ENTEROTOXIGEN E COLI, DNA: NEGATIVE
Enterotoxigen E Coli, DNA: NEGATIVE
P. SHIGELLOIDES, DNA: NEGATIVE
P. shigelloides, DNA: NEGATIVE
SALMONELLA SPECIES, DNA: NEGATIVE
SHIGA TOXIN PRODUCING, DNA: NEGATIVE
SHIGELLA SPECIES, DNA: NEGATIVE
Salmonella species, DNA: NEGATIVE
Shiga toxin producing, DNA: NEGATIVE
Shigella species, DNA: NEGATIVE
VIBRIO SPECIES, DNA: NEGATIVE
Vibrio species, DNA: NEGATIVE
Y. ENTEROCOLITICA, DNA: NEGATIVE
Y. enterocolitica, DNA: NEGATIVE

## 2020-05-13 LAB — EKG 12-LEAD
Atrial Rate: 63 {beats}/min
Atrial Rate: 63 {beats}/min
Diagnosis: NORMAL
Diagnosis: NORMAL
P Axis: 38 degrees
P Axis: 42 degrees
P-R Interval: 142 ms
P-R Interval: 142 ms
Q-T Interval: 414 ms
Q-T Interval: 444 ms
QRS Duration: 88 ms
QRS Duration: 88 ms
QTc Calculation (Bazett): 423 ms
QTc Calculation (Bazett): 454 ms
R Axis: 29 degrees
R Axis: 30 degrees
T Axis: -8 degrees
T Axis: 12 degrees
Ventricular Rate: 63 {beats}/min
Ventricular Rate: 63 {beats}/min

## 2020-05-14 LAB — CULTURE, URINE
Colonies Counted: >100000
Colony Count: >100000

## 2020-05-17 LAB — CULTURE, BLOOD
Culture result:: NO GROWTH
Culture result:: NO GROWTH

## 2020-05-17 LAB — CULTURE, BLOOD 1
Culture: NO GROWTH
Culture: NO GROWTH

## 2020-10-08 ENCOUNTER — Inpatient Hospital Stay: Admit: 2020-10-08 | Primary: Student in an Organized Health Care Education/Training Program

## 2020-10-08 LAB — LABCORP SPECIMEN COLLECTION

## 2020-10-15 ENCOUNTER — Encounter: Attending: Specialist | Primary: Internal Medicine

## 2020-10-19 ENCOUNTER — Encounter: Payer: MEDICAID | Attending: Specialist | Primary: Internal Medicine

## 2020-10-28 ENCOUNTER — Encounter

## 2020-10-29 ENCOUNTER — Encounter: Payer: MEDICAID | Attending: Specialist | Primary: Internal Medicine

## 2020-11-04 ENCOUNTER — Inpatient Hospital Stay: Payer: MEDICAID | Attending: Internal Medicine | Primary: Internal Medicine

## 2020-11-05 ENCOUNTER — Encounter: Payer: MEDICAID | Attending: Specialist | Primary: Internal Medicine

## 2020-11-12 ENCOUNTER — Ambulatory Visit: Attending: Specialist | Primary: Internal Medicine

## 2020-11-12 ENCOUNTER — Ambulatory Visit: Admit: 2020-11-12 | Discharge: 2020-11-12 | Payer: MEDICAID | Attending: Specialist | Primary: Internal Medicine

## 2020-11-12 DIAGNOSIS — M25552 Pain in left hip: Secondary | ICD-10-CM

## 2020-11-12 MED ORDER — DICLOFENAC 1 % TOPICAL GEL
1 % | Freq: Four times a day (QID) | CUTANEOUS | 5 refills | Status: AC
Start: 2020-11-12 — End: ?
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
## 2020-11-16 ENCOUNTER — Encounter

## 2020-11-16 ENCOUNTER — Telehealth

## 2020-11-16 NOTE — Telephone Encounter (Signed)
Discussed with Dr Grace Blight to prescribe any pain meds due to opioid crisis--pt can try tylenol, motrin

## 2020-11-16 NOTE — Telephone Encounter (Signed)
Discussed with Dr Kathreen Devoid can order MRI-arthrogram left hip--follow up appt afterwards

## 2020-11-16 NOTE — Telephone Encounter (Signed)
Informed pt of message. Pt agreed to the MRI arthrogram. MRI arthrogram ordered.

## 2020-11-16 NOTE — Addendum Note (Signed)
Addendum  Note by Flavia Shipper at 11/16/20 1653                Author: Flavia Shipper  Service: --  Author Type: Technician       Filed: 11/16/20 1653  Encounter Date: 11/16/2020  Status: Signed          Editor: Flavia Shipper (Technician)          Addended by: Flavia Shipper on: 11/16/2020 04:53 PM    Modules accepted: Orders

## 2020-11-16 NOTE — Telephone Encounter (Signed)
Patient returned call to office and was advised we cannot prescribe pain meds as outlined below, suggested Tylenol or Motrin and she said those do not work.  Physician mentioned physical therapy or possible MRI at 5/5 office visit and patient became increasingly frustrated when reminded of this - stating she cannot get out of bed. She kept asking why she needed to come in, I asked her if the message we left indicated she needed an appointment and she says she didn't know.  When offered an appointment she declined until we can tell her what it's for - are we going to give her medication or are we going to look at her hip?  Patient is requesting a call back from someone that can answer this question, [phone].

## 2020-11-16 NOTE — Telephone Encounter (Signed)
Patient called to request pain medication for hip pain. States she is unable to walk because of the extreme pain. Requesting call back ASAP    Phn#: [phone]

## 2020-11-16 NOTE — Telephone Encounter (Signed)
Please address with Dr. Blasdell directly

## 2020-11-16 NOTE — Telephone Encounter (Signed)
Left message for pt

## 2020-11-30 ENCOUNTER — Encounter: Payer: MEDICAID | Primary: Internal Medicine

## 2020-12-03 ENCOUNTER — Ambulatory Visit: Attending: Specialist | Primary: Internal Medicine

## 2021-03-12 ENCOUNTER — Inpatient Hospital Stay: Admit: 2021-03-12 | Primary: Internal Medicine

## 2021-03-12 ENCOUNTER — Inpatient Hospital Stay: Admit: 2021-03-12 | Payer: MEDICAID | Primary: Internal Medicine

## 2021-03-12 ENCOUNTER — Encounter

## 2021-03-12 DIAGNOSIS — M25551 Pain in right hip: Secondary | ICD-10-CM

## 2021-03-12 LAB — LABCORP SPECIMEN COLLECTION

## 2021-03-23 ENCOUNTER — Inpatient Hospital Stay: Payer: MEDICAID | Attending: Internal Medicine | Primary: Internal Medicine

## 2021-03-23 ENCOUNTER — Encounter

## 2021-03-23 DIAGNOSIS — Z1231 Encounter for screening mammogram for malignant neoplasm of breast: Secondary | ICD-10-CM

## 2021-04-07 ENCOUNTER — Encounter

## 2021-04-21 ENCOUNTER — Inpatient Hospital Stay: Admit: 2021-04-21 | Payer: MEDICAID | Attending: Internal Medicine | Primary: Internal Medicine

## 2021-04-21 DIAGNOSIS — N63 Unspecified lump in unspecified breast: Secondary | ICD-10-CM

## 2021-08-10 ENCOUNTER — Encounter

## 2021-08-13 ENCOUNTER — Encounter

## 2021-08-24 ENCOUNTER — Encounter

## 2021-09-02 ENCOUNTER — Inpatient Hospital Stay
Admission: EM | Admit: 2021-09-02 | Discharge: 2021-09-08 | Disposition: A | Discharge status: Home / Self Care | Payer: PRIVATE HEALTH INSURANCE | Admitting: Psychiatry

## 2021-09-02 ENCOUNTER — Emergency Department: Admit: 2021-09-02 | Payer: MEDICAID | Primary: Internal Medicine

## 2021-09-02 DIAGNOSIS — F3181 Bipolar II disorder: Secondary | ICD-10-CM

## 2021-09-02 DIAGNOSIS — F10929 Alcohol use, unspecified with intoxication, unspecified: Secondary | ICD-10-CM

## 2021-09-02 DIAGNOSIS — R45851 Suicidal ideations: Secondary | ICD-10-CM

## 2021-09-02 LAB — CBC WITH AUTO DIFFERENTIAL
Absolute Immature Granulocyte: 0 10*3/uL (ref 0.00–0.04)
Basophils %: 1 % (ref 0–2)
Basophils Absolute: 0 10*3/uL (ref 0.0–0.1)
Eosinophils %: 3 % (ref 0–5)
Eosinophils Absolute: 0.1 10*3/uL (ref 0.0–0.4)
Hematocrit: 31.3 % — ABNORMAL LOW (ref 35.0–45.0)
Hemoglobin: 11.3 g/dL — ABNORMAL LOW (ref 12.0–16.0)
Immature Granulocytes: 0 % (ref 0.0–0.5)
Lymphocytes %: 46 % (ref 21–52)
Lymphocytes Absolute: 2.1 10*3/uL (ref 0.9–3.6)
MCH: 34.5 PG — ABNORMAL HIGH (ref 24.0–34.0)
MCHC: 36.1 g/dL (ref 31.0–37.0)
MCV: 95.4 FL (ref 78.0–100.0)
MPV: 9.3 FL (ref 9.2–11.8)
Monocytes %: 8 % (ref 3–10)
Monocytes Absolute: 0.4 10*3/uL (ref 0.05–1.2)
Neutrophils %: 42 % (ref 40–73)
Neutrophils Absolute: 1.9 10*3/uL (ref 1.8–8.0)
Nucleated RBCs: 0 PER 100 WBC
Platelets: 141 10*3/uL (ref 135–420)
RBC: 3.28 M/uL — ABNORMAL LOW (ref 4.20–5.30)
RDW: 13.6 % (ref 11.6–14.5)
WBC: 4.5 10*3/uL — ABNORMAL LOW (ref 4.6–13.2)
nRBC: 0 10*3/uL (ref 0.00–0.01)

## 2021-09-02 LAB — COMPREHENSIVE METABOLIC PANEL
ALT: 142 U/L — ABNORMAL HIGH (ref 13–56)
AST: 637 U/L — ABNORMAL HIGH (ref 10–38)
Albumin/Globulin Ratio: 0.8 (ref 0.8–1.7)
Albumin: 3.7 g/dL (ref 3.4–5.0)
Alk Phosphatase: 150 U/L — ABNORMAL HIGH (ref 45–117)
Anion Gap: 11 mmol/L (ref 3.0–18)
BUN: 8 MG/DL (ref 7.0–18)
Bun/Cre Ratio: 15 (ref 12–20)
CO2: 21 mmol/L (ref 21–32)
Calcium: 9.2 MG/DL (ref 8.5–10.1)
Chloride: 103 mmol/L (ref 100–111)
Creatinine: 0.54 MG/DL — ABNORMAL LOW (ref 0.6–1.3)
Est, Glom Filt Rate: >60 mL/min/{1.73_m2} (ref >60)
Globulin: 4.9 g/dL — ABNORMAL HIGH (ref 2.0–4.0)
Glucose: 91 mg/dL (ref 74–99)
Potassium: 4.1 mmol/L (ref 3.5–5.5)
Sodium: 135 mmol/L — ABNORMAL LOW (ref 136–145)
Total Bilirubin: 0.4 MG/DL (ref 0.2–1.0)
Total Protein: 8.6 g/dL — ABNORMAL HIGH (ref 6.4–8.2)

## 2021-09-02 LAB — URINE DRUG SCREEN
Amphetamine, Urine: NEGATIVE
Barbiturates, Urine: NEGATIVE
Benzodiazepines, Urine: NEGATIVE
Cocaine, Urine: NEGATIVE
Methadone, Urine: NEGATIVE
Opiates, Urine: NEGATIVE
PCP, Urine: NEGATIVE
THC, TH-Cannabinol, Urine: NEGATIVE

## 2021-09-02 MED ORDER — KETOROLAC TROMETHAMINE 15 MG/ML IJ SOLN
15 MG/ML | Freq: Once | INTRAMUSCULAR | Status: AC
Start: 2021-09-02 — End: 2021-09-02
  Administered 2021-09-02: 23:00:00 15 mg via INTRAVENOUS

## 2021-09-02 MED FILL — KETOROLAC TROMETHAMINE 15 MG/ML IJ SOLN: 15 MG/ML | INTRAMUSCULAR | Qty: 1

## 2021-09-02 NOTE — ED Provider Notes (Signed)
EMERGENCY DEPARTMENT HISTORY AND PHYSICAL EXAM      Date: 09/02/2021  Patient Name: Vicki Osborne      History of Presenting Illness     Chief Complaint   Patient presents with    Suicidal    Alcohol Intoxication       Location/Duration/Severity/Modifying factors   Chief Complaint   Patient presents with    Suicidal    Alcohol Intoxication       HPI:  Vicki Osborne is a 51 y.o. female with history as listed presents with a concern of suicidal ideation with plan to take her life by overdosing on pills.  Specific medications, but anything she can by legally on the street such as narcotics.  Admits to drinking 6 beers a around 7 this morning.  Took her lithium and gabapentin as well for chronic conditions including chronic pain.  The reason he wants to take her life is because she has chronic uncontrolled pain throughout her body including back of the head, neck, upper and lower extremities this has been present for the past 1.5 months.  Denies any recent trauma to explain the pain.  Denies any current chest pain or shortness of breath, abdominal pain, nausea or vomiting.  States "always feels dizzy."  Denies any illicit drug use today, but did fall 4 times today which she attributed to her right leg giving out.    Associated Symptoms:see ROS      There are no other complaints, changes, or physical findings at this time.    PCP: Anne Shutter, MD    No current facility-administered medications for this encounter.     Current Outpatient Medications   Medication Sig Dispense Refill    benztropine (COGENTIN) 2 MG tablet Take 2 mg by mouth 2 times daily      carvedilol (COREG) 6.25 MG tablet Take 6.25 mg by mouth 2 times daily (with meals)      diclofenac sodium (VOLTAREN) 1 % GEL Apply 4 g topically 4 times daily      gabapentin (NEURONTIN) 300 MG capsule Take 300 mg by mouth every morning.      gabapentin (NEURONTIN) 600 MG tablet Take 600 mg by mouth.      lisinopril (PRINIVIL;ZESTRIL) 10 MG tablet Take 5 mg by  mouth as needed      lithium 300 MG capsule Take 300 mg by mouth every morning      lithium 600 MG capsule Take 600 mg by mouth      prazosin (MINIPRESS) 2 MG capsule Take 2 mg by mouth      QUEtiapine (SEROQUEL) 100 MG tablet Take 100 mg by mouth every morning         Past History     Past Medical History:  No past medical history on file.    Past Surgical History:  Past Surgical History:   Procedure Laterality Date    APPENDECTOMY      TONSILLECTOMY AND ADENOIDECTOMY         Family History:  Family History   Problem Relation Age of Onset    Lung Cancer Mother     Breast Cancer Maternal Grandmother        Social History:  Social History     Tobacco Use    Smoking status: Former    Smokeless tobacco: Never   Substance Use Topics    Alcohol use: Yes       Allergies:  Allergies   Allergen Reactions  Latex Hives         Review of Systems     Gen: Denies fevers.  HEENT: Denies rhinorrhea.  Pulm: Denies cough, shortness of breath  Neuro: Denies headache, paresthesias.  Ext: Negative for extremity swelling.  Skin: Denies rash, diaphoresis.  All other systems negative except stated above in HPI.      Physical Exam   Vital signs wdl  General: Patient is awake and alert, resting comfortably in no acute distress.  Slightly slurred speech, appears to be mildly intoxicated.  Head: Normocephalic and atraumatic  Eyes: Extraocular muscles intact, no conjunctival pallor  Ear, nose, throat: Normal external exam  Neck: Normal range of motion, tender to palpation in the cervical spine diffusely and paraspinal musculature.  Cardiovascular: RRR, warm, well-perfused extremities  Respiratory: Patient is in no respiratory distress, normal respiratory effort  GI: soft, non-tender, non-distended  MSK: No gross deformities appreciated.  No tenderness palpation of his, no gross deformity to ankle and knee joints, elbow joints, upper and lower extremities.  Ankle  Extremities: pulses intact with good cap refills, no LE pitting edema.  Skin:  Warm, dry, and intact  Neuro: The patient is alert and oriented, no gross motor defects noted.   Psych: And affect anxious mood, stating she wants to take her life, unsure why she is here.  Does answer questions appropriately.      Lab and Diagnostic Study Results     Labs -  Recent Results (from the past 24 hour(s))   EKG 12 Lead    Collection Time: 09/02/21  5:23 PM   Result Value Ref Range    Ventricular Rate 94 BPM    Atrial Rate 94 BPM    P-R Interval 190 ms    QRS Duration 94 ms    Q-T Interval 362 ms    QTc Calculation (Bazett) 452 ms    P Axis 55 degrees    R Axis -4 degrees    T Axis 2 degrees    Diagnosis Normal sinus rhythm    CBC with Auto Differential    Collection Time: 09/02/21  5:25 PM   Result Value Ref Range    WBC 4.5 (L) 4.6 - 13.2 K/uL    RBC 3.28 (L) 4.20 - 5.30 M/uL    Hemoglobin 11.3 (L) 12.0 - 16.0 g/dL    Hematocrit 31.3 (L) 35.0 - 45.0 %    MCV 95.4 78.0 - 100.0 FL    MCH 34.5 (H) 24.0 - 34.0 PG    MCHC 36.1 31.0 - 37.0 g/dL    RDW 13.6 11.6 - 14.5 %    Platelets 141 135 - 420 K/uL    MPV 9.3 9.2 - 11.8 FL    Nucleated RBCs 0.0 0 PER 100 WBC    nRBC 0.00 0.00 - 0.01 K/uL    Seg Neutrophils 42 40 - 73 %    Lymphocytes 46 21 - 52 %    Monocytes 8 3 - 10 %    Eosinophils % 3 0 - 5 %    Basophils 1 0 - 2 %    Immature Granulocytes 0 0.0 - 0.5 %    Segs Absolute 1.9 1.8 - 8.0 K/UL    Absolute Lymph # 2.1 0.9 - 3.6 K/UL    Absolute Mono # 0.4 0.05 - 1.2 K/UL    Absolute Eos # 0.1 0.0 - 0.4 K/UL    Basophils Absolute 0.0 0.0 - 0.1 K/UL    Absolute Immature  Granulocyte 0.0 0.00 - 0.04 K/UL    Differential Type AUTOMATED     Comprehensive Metabolic Panel    Collection Time: 09/02/21  5:25 PM   Result Value Ref Range    Sodium 135 (L) 136 - 145 mmol/L    Potassium 4.1 3.5 - 5.5 mmol/L    Chloride 103 100 - 111 mmol/L    CO2 21 21 - 32 mmol/L    Anion Gap 11 3.0 - 18 mmol/L    Glucose 91 74 - 99 mg/dL    BUN 8 7.0 - 18 MG/DL    Creatinine 0.54 (L) 0.6 - 1.3 MG/DL    Bun/Cre Ratio 15 12 - 20      Est,  Glom Filt Rate >60 >60 ml/min/1.48m    Calcium 9.2 8.5 - 10.1 MG/DL    Total Bilirubin 0.4 0.2 - 1.0 MG/DL    ALT 142 (H) 13 - 56 U/L    AST 637 (H) 10 - 38 U/L    Alk Phosphatase 150 (H) 45 - 117 U/L    Total Protein 8.6 (H) 6.4 - 8.2 g/dL    Albumin 3.7 3.4 - 5.0 g/dL    Globulin 4.9 (H) 2.0 - 4.0 g/dL    Albumin/Globulin Ratio 0.8 0.8 - 1.7     ETOH    Collection Time: 09/02/21  5:25 PM   Result Value Ref Range    Ethanol Lvl 297 (H) 0 - 3 MG/DL   Urinalysis    Collection Time: 09/02/21  6:10 PM   Result Value Ref Range    Color, UA YELLOW      Appearance CLEAR      Specific Gravity, UA <1.005 1.003 - 1.030    pH, Urine 6.0 5.0 - 8.0      Protein, UA Negative NEG mg/dL    Glucose, UA Negative NEG mg/dL    Ketones, Urine Negative NEG mg/dL    Bilirubin Urine Negative NEG      Blood, Urine Negative NEG      Urobilinogen, Urine 0.2 0.2 - 1.0 EU/dL    Nitrite, Urine Negative NEG      Leukocyte Esterase, Urine TRACE (A) NEG     Urine Drug Screen    Collection Time: 09/02/21  6:10 PM   Result Value Ref Range    Benzodiazepines, Urine Negative NEG      Barbiturates, Urine Negative NEG      THC, TH-Cannabinol, Urine Negative NEG      Opiates, Urine Negative NEG      PCP, Urine Negative NEG      Cocaine, Urine Negative NEG      Amphetamine, Urine Negative NEG      Methadone, Urine Negative NEG      Comments: (NOTE)    Urinalysis, Micro    Collection Time: 09/02/21  6:10 PM   Result Value Ref Range    WBC, UA 0 to 1 0 - 4 /hpf    RBC, UA Negative 0 - 5 /hpf    Epithelial Cells UA 1+ 0 - 5 /lpf    BACTERIA, URINE Negative NEG /hpf    Amorphous Crystal 1+ (A) NEG         Radiologic Studies -   CT Head W/O Contrast   Final Result   No evidence of acute intracranial abnormality.               Procedures and Critical Care       Performed by: JCory Roughen  Rivky Clendenning, DO    Procedures         Darshay Deupree, DO    Medical Decision Making and ED Course   - I am the first and primary provider for this patient AND AM THE PRIMARY PROVIDER OF  RECORD.    - I reviewed the vital signs, available nursing notes, past medical history, past surgical history, family history and social history.    - Initial assessment performed. The patients presenting problems have been discussed, and the staff are in agreement with the care plan formulated and outlined with them.  I have encouraged them to ask questions as they arise throughout their visit.    Vital Signs-Reviewed the patient's vital signs.    '@[email or web address]' @      Provider Notes (Medical Decision Making):     MDM  Patient presents with suicidal ideation with plan  with chronic pain disorder is likely contributing factor.  Patient is medically cleared for evaluation for inpatient psychiatric admission.  As patient does not voluntarily want to stay so she can take her life, recommendation by crisis screener is for a TDO for involuntary psychiatric admission which I agree with.  Patient did have elevated serum ethanol level but not clinically intoxicated.  Transaminitis likely secondary to alcohol intoxication.  Low suspicion for cholecystitis, choledocholithiasis as no report of epigastric pain, postprandial pain, nausea or vomiting.  Patient signed over to Dr. Netty Starring.    ED Course:   Initiate [facility] consultation, screening labs for medical clearance, CT brain due to her frequent falls today, IV Toradol for symptom relief.    ED Course as of 09/02/21 2141   Thu Sep 02, 2021   1806 Normal sinus rhythm, no ST segment elevations or depressions, unreliable baseline and leads III, aVL and aVF, normal QTc interval, QRS and PR intervals [JM]   2034 Informed by crisis screener that she is determined the patient needs TDO for inpatient psychiatry admission as is not willing to stay voluntarily. [JM]      ED Course User Index  [JM] Alveria Apley, DO     ------------------------------------------------------------------------------------------------------------        Consultations:       Consultations: ED crisis  screener      Disposition         DISPOSITION Ed Observation 09/02/2021 09:39:00 PM        Diagnosis     Clinical Impression:   1. Planning to commit suicide    2. Acute alcoholic intoxication with complication ([place])    3. Transaminitis        Attestations:    Alveria Apley, DO               Alveria Apley, DO  09/02/21 2141
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
## 2021-09-02 NOTE — ED Notes (Signed)
Pt alert,  States plan to hurt self is to go home, go to sleep and not wake up.  Pt asking if they put something in my iv     Clayton Bibles, RN  09/02/21 [phone]
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
## 2021-09-02 NOTE — ED Notes (Signed)
Pt to CT w/ transporter.     Hardin Negus, RN  09/02/21 [phone]

## 2021-09-02 NOTE — ED Notes (Signed)
Pt belonging bag x2 placed in [address]     Vicki Osborne A. [name]  09/02/21 1720

## 2021-09-02 NOTE — ED Notes (Signed)
Pt provided a sandwich and drink by ED tech. Pt denies further needs at this time.     Hardin Negus, RN  09/02/21 2016

## 2021-09-02 NOTE — ED Provider Notes (Signed)
51 year old female presents to the emergency department with thoughts of SI.  While she is pending her evaluation she noted that she had bright red blood from her vagina.  Last time she had a period was 4 years ago.  Patient has no pain.  I did a quick vaginal exam which was significant for dark blood coming out of the os.  No pooling.  It was scant.  I do not believe this requires further work-up     Jerre Simon, MD  09/03/21 [phone]

## 2021-09-03 LAB — HEPATIC FUNCTION PANEL
ALT: 129 U/L — ABNORMAL HIGH (ref 13–56)
AST: 552 U/L — ABNORMAL HIGH (ref 10–38)
Albumin/Globulin Ratio: 0.7 — ABNORMAL LOW (ref 0.8–1.7)
Albumin: 3.5 g/dL (ref 3.4–5.0)
Alk Phosphatase: 137 U/L — ABNORMAL HIGH (ref 45–117)
Bilirubin, Direct: 0.1 MG/DL (ref 0.0–0.2)
Globulin: 5.1 g/dL — ABNORMAL HIGH (ref 2.0–4.0)
Total Bilirubin: 0.4 MG/DL (ref 0.2–1.0)
Total Protein: 8.6 g/dL — ABNORMAL HIGH (ref 6.4–8.2)

## 2021-09-03 LAB — ETHANOL
Ethanol Lvl: 297 MG/DL — ABNORMAL HIGH (ref 0–3)
Ethanol Lvl: 7 MG/DL — ABNORMAL HIGH (ref 0–3)

## 2021-09-03 LAB — URINALYSIS, MICRO
BACTERIA, URINE: NEGATIVE /hpf
RBC, UA: NEGATIVE /hpf (ref 0–5)
WBC, UA: 0 /hpf (ref 0–4)

## 2021-09-03 LAB — URINALYSIS
Bilirubin Urine: NEGATIVE
Blood, Urine: NEGATIVE
Glucose, UA: NEGATIVE mg/dL
Ketones, Urine: NEGATIVE mg/dL
Nitrite, Urine: NEGATIVE
Protein, UA: NEGATIVE mg/dL
Specific Gravity, UA: <1.005 (ref 1.003–1.030)
Urobilinogen, Urine: 0.2 EU/dL (ref 0.2–1.0)
pH, Urine: 6 (ref 5.0–8.0)

## 2021-09-03 LAB — EKG 12-LEAD
Atrial Rate: 94 {beats}/min
Diagnosis: NORMAL
P Axis: 55 degrees
P-R Interval: 190 ms
Q-T Interval: 362 ms
QRS Duration: 94 ms
QTc Calculation (Bazett): 452 ms
R Axis: -4 degrees
T Axis: 2 degrees
Ventricular Rate: 94 {beats}/min

## 2021-09-03 LAB — COVID-19 & INFLUENZA COMBO
Rapid Influenza A By PCR: NOT DETECTED
Rapid Influenza B By PCR: NOT DETECTED
SARS-CoV-2, PCR: NOT DETECTED

## 2021-09-03 LAB — PREGNANCY, URINE: HCG(Urine) Pregnancy Test: NEGATIVE

## 2021-09-03 MED ORDER — LORAZEPAM 1 MG PO TABS
1 MG | ORAL | Status: DC | PRN
Start: 2021-09-03 — End: 2021-09-04
  Administered 2021-09-03 – 2021-09-04 (×2): 1 mg via ORAL

## 2021-09-03 MED FILL — LORAZEPAM 1 MG PO TABS: 1 MG | ORAL | Qty: 1

## 2021-09-03 NOTE — ED Notes (Signed)
Pt report received from [facility] RN      Lorelei Pont, RN  09/03/21 1154

## 2021-09-03 NOTE — Other (Signed)
Crisis Note: Writer reported patient's ETOH results to Robin with [facility], [phone].

## 2021-09-03 NOTE — ED Notes (Signed)
Care taken over by Terry M Charge RN   This RN moved to different assignment      Wylan Gentzler A Daishaun Ayre, RN  09/03/21 1951

## 2021-09-03 NOTE — ED Notes (Signed)
Pt requests to take a shower.   RN will arrange a sitter to do that at some point today.   Pt also requests a phone. Phone provided to the patient.      Lorelei Pont, RN  09/03/21 1422

## 2021-09-03 NOTE — ED Notes (Signed)
Pt states she is bleeding from the vagina.  Says she went through menopause 4 years ago     Clayton Bibles, [place]  09/03/21 [phone]

## 2021-09-03 NOTE — ED Notes (Signed)
TRANSFER - OUT REPORT:    Verbal report given to [facility] RN on [employer]  being transferred to crisis  for routine progression of patient care       Report consisted of patient's Situation, Background, Assessment and   Recommendations(SBAR).     Information from the following report(s) Nurse Handoff Report, ED SBAR, [facility], Recent Results, and Alarm Parameters was reviewed with the receiving nurse.  Kinder Assessment: Presents to emergency department  because of falls (Syncope, seizure, or loss of consciousness): No, Age > 70: No, Altered Mental Status, Intoxication with alcohol or substance confusion (Disorientation, impaired judgment, poor safety awaremess, or inability to follow instructions): Yes, Impaired Mobility: Ambulates or transfers with assistive devices or assistance; Unable to ambulate or transer.: No, Nursing Judgement: Yes  Lines:   Peripheral IV 09/02/21 Right Antecubital (Active)   Site Assessment Clean, dry & intact 09/02/21 1732   Line Status Blood return noted 09/02/21 1732   Phlebitis Assessment No symptoms 09/02/21 1732   Infiltration Assessment 0 09/02/21 1732        Opportunity for questions and clarification was provided.      Patient transported with:  Georgiana Shore, RN  09/03/21 2008

## 2021-09-03 NOTE — ED Notes (Signed)
Pt taken to shower and accompanied by the sitter. Pt provided with clean scrubs, deodorant, toothbrush ect.        Lorelei Pont, RN  09/03/21 [phone]

## 2021-09-03 NOTE — Progress Notes (Signed)
Chaplain completed the initial Spiritual Assessment of the patient, and offered Pastoral Care support to the patient. Patient  was asked about her having an advance directive and replied no and that she did not want one either.. Patient does not have any religious/cultural needs that will affect patient???s preferences in health care. Chaplains will continue to follow and will provide pastoral care on an as needed/requested basis.    Dewaine Oats  Spiritual Care Department  [phone]

## 2021-09-03 NOTE — ED Notes (Signed)
Pt alert + oriented x 4. Pt was in bed with eyes closed equal chest rise and fall. Pt has a patent airway.   RN wakes the patient up. Pt denies any complaints. Pt reports she is SI had a plan of "putting a nice dress on shoes and taking pills to never wake up."   Pt reports she does normally  drink approx 2 -3 beers a day. Pt states "yesterday I drank way too much."   Pt denies any concerns for etoh withdrawal and is aware to notify the nurse if there is any concerns.   Pt's safety meal tray brought to bedside.        Lorelei Pont, RN  09/03/21 [phone]

## 2021-09-03 NOTE — ED Notes (Signed)
Pt resting comfortably in bed at this time. Sitter remains at bedside.      Lorelei Pont, RN  09/03/21 [phone]

## 2021-09-03 NOTE — ED Notes (Signed)
Pt eating breakfast. Denies pain, c/o tremors. MD aware.      Janiaya Ryser [place], [place]  09/03/21 [phone]

## 2021-09-03 NOTE — ED Notes (Signed)
Pt up to the bathroom.      Vicki Osborne [place], [place]  09/03/21 [phone]

## 2021-09-03 NOTE — ED Notes (Signed)
Hepatic panel redrawn and ALT, AST are trending down.    Crisis made aware       Ines Bloomer, RN  09/03/21 [phone]

## 2021-09-03 NOTE — ED Notes (Signed)
Assisted with pelvic      Clayton Bibles, RN  09/03/21 [phone]

## 2021-09-03 NOTE — Plan of Care (Signed)
Problem: Self Harm/Suicidality  Goal: Will have no self-injury during hospital stay  Description: INTERVENTIONS:  1.  Ensure constant observer at bedside with Q15M safety checks  2.  Maintain a safe environment  3.  Secure patient belongings  4.  Ensure family/visitors adhere to safety recommendations  5.  Ensure safety tray has been added to patient's diet order  6.  Every shift and PRN: Re-assess suicidal risk via Frequent Screener    Flowsheets (Taken 09/03/2021 2226)  Will have no self-injury during hospital stay:   Ensure constant observer at bedside with Q15M safety checks   Secure patient belongings   Ensure safety tray has been added to patient's diet order   Every shift and PRN: Re-assess suicidal risk via Frequent Screener   Pt. arrived from ED for suicidal ideation with a plan to overdose on medications. She is alert and oriented x 4.She is pleasant and cooperative during admission process.she has flat affect with sad mood and good eye contact.pt. has a history of alcohol abuse, she drinks 3-4 beers daily.Her initial alcohol level in ED was 297.  RN WILL INITIATE, IMPLEMENT, DEVELOP, REVIEW OR REVISE TREATMENT PLAN.

## 2021-09-03 NOTE — ED Notes (Signed)
Patient resting comfortably on the stretcher..  Patient has no signs or symptoms of distress at this time.  Patient updated and has no questions or concerns about their treatment plan. Will continue monitoring. Sitter at bedside.      Harumi Yamin [place], RN  09/03/21 [phone]

## 2021-09-03 NOTE — ED Notes (Signed)
This RN attempted to call report. The accepting nurse advises she is unable to take pt at this time, will return call to ED.      Lorelei Pont, RN  09/03/21 [phone]

## 2021-09-03 NOTE — ED Notes (Signed)
Pt provided with safety meal tray. Pt eating w/o complications.      Lorelei Pont, RN  09/03/21 [phone]

## 2021-09-03 NOTE — Other (Signed)
Crisis Note: Patient was accepted by Cristie Hem, with [facility]. Peggy also with [facility] called back and informed crisis patient was declined due to "AST was 637." Patient is a continued bed search.

## 2021-09-03 NOTE — Other (Signed)
Crisis Note: Clinical research associate received call from [place] with [facility], [phone], who reports [facility] needs repeat BAC. Provider and charge nurse made aware.

## 2021-09-03 NOTE — Other (Signed)
Crisis Note: Clinical research associate received call from [place] with [facility], [phone], who reports patient has been accepted to [facility] by Dr. Lajuan Lines. Patient admitting to room 308 A. Nurse to call report to [phone].

## 2021-09-03 NOTE — ED Notes (Signed)
Patient resting comfortably on the stretcher.  Patient has no signs or symptoms of distress at this time.  Pt jittery still after ativan but feels more relaxed. Patient updated and has no questions or concerns about their treatment plan. Will continue monitoring.      Glenard Keesling [place], RN  09/03/21 1012

## 2021-09-04 LAB — HEPATIC FUNCTION PANEL
ALT: 158 U/L — ABNORMAL HIGH (ref 13–56)
AST: 767 U/L — ABNORMAL HIGH (ref 10–38)
Albumin/Globulin Ratio: 0.6 — ABNORMAL LOW (ref 0.8–1.7)
Albumin: 3.5 g/dL (ref 3.4–5.0)
Alk Phosphatase: 148 U/L — ABNORMAL HIGH (ref 45–117)
Bilirubin, Direct: 0.2 MG/DL (ref 0.0–0.2)
Globulin: 5.6 g/dL — ABNORMAL HIGH (ref 2.0–4.0)
Total Bilirubin: 0.7 MG/DL (ref 0.2–1.0)
Total Protein: 9.1 g/dL — ABNORMAL HIGH (ref 6.4–8.2)

## 2021-09-04 LAB — IRON AND TIBC
Iron % Saturation: 24 % (ref 20–50)
Iron: 88 ug/dL (ref 50–175)
TIBC: 361 ug/dL (ref 250–450)

## 2021-09-04 LAB — VITAMIN B12 & FOLATE
Folate: >20 ng/mL — ABNORMAL HIGH (ref 3.10–17.50)
Vitamin B-12: 440 pg/mL (ref 211–911)

## 2021-09-04 LAB — PROTIME-INR
INR: 1.1 (ref 0.8–1.2)
Protime: 14.6 s (ref 11.5–15.2)

## 2021-09-04 LAB — TSH: TSH, 3RD GENERATION: 4.77 u[IU]/mL — ABNORMAL HIGH (ref 0.36–3.74)

## 2021-09-04 LAB — AMMONIA: Ammonia: 62 umol/L — ABNORMAL HIGH (ref 11–32)

## 2021-09-04 LAB — ACETAMINOPHEN LEVEL: Acetaminophen Level: <2 ug/mL — ABNORMAL LOW (ref 10.0–30.0)

## 2021-09-04 LAB — T4, FREE: T4 Free: 0.8 NG/DL (ref 0.7–1.5)

## 2021-09-04 MED ORDER — LISINOPRIL 10 MG PO TABS
10 MG | Freq: Every day | ORAL | Status: DC
Start: 2021-09-04 — End: 2021-09-04

## 2021-09-04 MED ORDER — HYDROXYZINE PAMOATE 25 MG PO CAPS
25 MG | ORAL | Status: DC | PRN
Start: 2021-09-04 — End: 2021-09-08
  Administered 2021-09-07 – 2021-09-08 (×3): 25 mg via ORAL

## 2021-09-04 MED ORDER — TRAZODONE HCL 50 MG PO TABS
50 MG | Freq: Every evening | ORAL | Status: DC | PRN
Start: 2021-09-04 — End: 2021-09-08
  Administered 2021-09-05 – 2021-09-08 (×4): 50 mg via ORAL

## 2021-09-04 MED ORDER — LITHIUM CARBONATE ER 300 MG PO TBCR
300 MG | Freq: Every evening | ORAL | Status: DC
Start: 2021-09-04 — End: 2021-09-08
  Administered 2021-09-04 – 2021-09-07 (×4): 600 mg via ORAL

## 2021-09-04 MED ORDER — LURASIDONE HCL 40 MG PO TABS
40 MG | Freq: Every evening | ORAL | Status: DC
Start: 2021-09-04 — End: 2021-09-08
  Administered 2021-09-04 – 2021-09-07 (×4): 20 mg via ORAL

## 2021-09-04 MED ORDER — HYDROCHLOROTHIAZIDE 12.5 MG PO CAPS
12.5 MG | Freq: Every day | ORAL | Status: DC
Start: 2021-09-04 — End: 2021-09-08
  Administered 2021-09-04 – 2021-09-08 (×5): 12.5 mg via ORAL

## 2021-09-04 MED ORDER — NICOTINE 14 MG/24HR TD PT24
1424 MG/24HR | Freq: Every day | TRANSDERMAL | Status: DC
Start: 2021-09-04 — End: 2021-09-08
  Administered 2021-09-04 – 2021-09-08 (×4): 1 via TRANSDERMAL

## 2021-09-04 MED ORDER — LORAZEPAM 1 MG PO TABS
1 MG | ORAL | Status: DC | PRN
Start: 2021-09-04 — End: 2021-09-08
  Administered 2021-09-04 – 2021-09-07 (×7): 1 mg via ORAL

## 2021-09-04 MED ORDER — TRAZODONE HCL 50 MG PO TABS
50 MG | Freq: Every evening | ORAL | Status: DC | PRN
Start: 2021-09-04 — End: 2021-09-04
  Administered 2021-09-04: 03:00:00 25 mg via ORAL

## 2021-09-04 MED ORDER — POTASSIUM CHLORIDE CRYS ER 10 MEQ PO TBCR
10 MEQ | Freq: Every day | ORAL | Status: DC
Start: 2021-09-04 — End: 2021-09-06
  Administered 2021-09-05: 14:00:00 10 meq via ORAL

## 2021-09-04 MED ORDER — GABAPENTIN 300 MG PO CAPS
300 MG | Freq: Every evening | ORAL | Status: DC
Start: 2021-09-04 — End: 2021-09-08
  Administered 2021-09-04 – 2021-09-07 (×4): 600 mg via ORAL

## 2021-09-04 MED ORDER — GABAPENTIN 300 MG PO CAPS
300 MG | Freq: Every morning | ORAL | Status: DC
Start: 2021-09-04 — End: 2021-09-05
  Administered 2021-09-05: 14:00:00 300 mg via ORAL

## 2021-09-04 MED ORDER — LITHIUM CARBONATE ER 300 MG PO TBCR
300 MG | Freq: Every morning | ORAL | Status: DC
Start: 2021-09-04 — End: 2021-09-08
  Administered 2021-09-05 – 2021-09-08 (×4): 300 mg via ORAL

## 2021-09-04 MED ORDER — ACETAMINOPHEN 325 MG PO TABS
325 | ORAL | Status: DC | PRN
Start: 2021-09-04 — End: 2021-09-04

## 2021-09-04 MED ORDER — THIAMINE HCL 100 MG PO TABS
100 MG | Freq: Every day | ORAL | Status: DC
Start: 2021-09-04 — End: 2021-09-05
  Administered 2021-09-04: 21:00:00 100 mg via ORAL

## 2021-09-04 MED ORDER — IBUPROFEN 400 MG PO TABS
400 | Freq: Four times a day (QID) | ORAL | Status: DC | PRN
Start: 2021-09-04 — End: 2021-09-04

## 2021-09-04 MED ORDER — PANTOPRAZOLE SODIUM 40 MG PO TBEC
40 MG | Freq: Every day | ORAL | Status: DC
Start: 2021-09-04 — End: 2021-09-08
  Administered 2021-09-05 – 2021-09-08 (×4): 40 mg via ORAL

## 2021-09-04 MED ORDER — LORAZEPAM 1 MG PO TABS
1 MG | ORAL | Status: DC | PRN
Start: 2021-09-04 — End: 2021-09-08
  Administered 2021-09-04 – 2021-09-05 (×2): 2 mg via ORAL

## 2021-09-04 MED ORDER — MULTIPLE VITAMINS PO TABS
Freq: Every day | ORAL | Status: DC
Start: 2021-09-04 — End: 2021-09-05
  Administered 2021-09-04: 21:00:00 1 via ORAL

## 2021-09-04 MED ORDER — CARVEDILOL 3.125 MG PO TABS
3.125 MG | Freq: Two times a day (BID) | ORAL | Status: DC
Start: 2021-09-04 — End: 2021-09-08
  Administered 2021-09-05 – 2021-09-08 (×7): 12.5 mg via ORAL

## 2021-09-04 MED ORDER — ASPIRIN 81 MG PO CHEW
81 MG | Freq: Every day | ORAL | Status: DC
Start: 2021-09-04 — End: 2021-09-08
  Administered 2021-09-04 – 2021-09-08 (×5): 81 mg via ORAL

## 2021-09-04 MED FILL — NICOTINE 14 MG/24HR TD PT24: 14 MG/24HR | TRANSDERMAL | Qty: 1

## 2021-09-04 MED FILL — THERA PO TABS: ORAL | Qty: 1

## 2021-09-04 MED FILL — LORAZEPAM 1 MG PO TABS: 1 MG | ORAL | Qty: 1

## 2021-09-04 MED FILL — LATUDA 40 MG PO TABS: 40 MG | ORAL | Qty: 1

## 2021-09-04 MED FILL — LITHIUM CARBONATE ER 300 MG PO TBCR: 300 MG | ORAL | Qty: 2

## 2021-09-04 MED FILL — GABAPENTIN 300 MG PO CAPS: 300 MG | ORAL | Qty: 2

## 2021-09-04 MED FILL — TRAZODONE HCL 50 MG PO TABS: 50 MG | ORAL | Qty: 1

## 2021-09-04 MED FILL — LORAZEPAM 1 MG PO TABS: 1 MG | ORAL | Qty: 2

## 2021-09-04 MED FILL — ASPIRIN 81 MG PO CHEW: 81 MG | ORAL | Qty: 1

## 2021-09-04 MED FILL — LITHIUM CARBONATE ER 300 MG PO TBCR: 300 MG | ORAL | Qty: 1

## 2021-09-04 MED FILL — THIAMINE HCL 100 MG PO TABS: 100 MG | ORAL | Qty: 1

## 2021-09-04 MED FILL — HYDROCHLOROTHIAZIDE 12.5 MG PO CAPS: 12.5 MG | ORAL | Qty: 1

## 2021-09-04 MED FILL — LISINOPRIL 10 MG PO TABS: 10 MG | ORAL | Qty: 1

## 2021-09-04 NOTE — Progress Notes (Signed)
CIWA score of 8. Pt received PRN Ativan 2 mg.

## 2021-09-04 NOTE — Progress Notes (Signed)
Ammonia level of 32. MD made aware. Pt currently watching television in the day area. Line-of-Sight observation in place.

## 2021-09-04 NOTE — Progress Notes (Addendum)
During physical assessment with [facility], pt reported right sided weakness that causes her to lose feeling of extremities, bright red bowel movements multiple times per week, as well as new onset vaginal bleeding five years post menopause. Attending provider made aware. Line of Sight observation ordered.

## 2021-09-04 NOTE — Other (Signed)
Art Therapy Group Progress Note     PATIENT SCHEDULED FOR GROUP AT:    12:30 pm    GROUP STOP TIME: 1: 10 pm     ATTENDANCE: half (7/13 participants)     PARTICIPATION LEVEL:  Pt attended group and participated in artmaking.    ATTENTION LEVEL:  Pt was invested in artmaking.     TOPIC / FOCUS:  Reality orientation/ Partial Drawing     SYMBOLIC & THEMATIC CONTENT AS NOTED IN IMAGERY:  Pt entered group late due to a meeting with Staff.  Pt said her rainbow art was sad and depressed like her life, but every color has its own light no matter how dark. Pt displayed some low self-esteem about her visual art skills, although she asked if she could keep her artwork to frame and gift to her son.     Jeannette How, MA   Art Therapist

## 2021-09-04 NOTE — Plan of Care (Addendum)
Problem: Self Harm/Suicidality  Goal: Will have no self-injury during hospital stay  Description: INTERVENTIONS:  1.  Ensure constant observer at bedside with Q15M safety checks  2.  Maintain a safe environment  3.  Secure patient belongings  4.  Ensure family/visitors adhere to safety recommendations  5.  Ensure safety tray has been added to patient's diet order  6.  Every shift and PRN: Re-assess suicidal risk via Frequent Screener    09/04/2021 1022 by Virgia Land, RN  Outcome: Progressing  09/03/2021 2226 by Randa Lynn, RN  Flowsheets (Taken 09/03/2021 2226)  Will have no self-injury during hospital stay:   Ensure constant observer at bedside with Q15M safety checks   Secure patient belongings   Ensure safety tray has been added to patient's diet order   Every shift and PRN: Re-assess suicidal risk via Frequent Screener     Problem: Behavior  Goal: Pt/Family maintain appropriate behavior and adhere to behavioral management agreement, if implemented  Description: INTERVENTIONS:  1. Assess patient/family's coping skills and  non-compliant behavior (including use of illegal substances)  2. Notify security of behavior or suspected illegal substances which indicate the need for search of the family and/or belongings  3. Encourage verbalization of thoughts and concerns in a socially appropriate manner  4. Utilize positive, consistent limit setting strategies supporting safety of patient, staff and others  5. Encourage participation in the decision making process about the behavioral management agreement  6. If a visitor's behavior poses a threat to safety call refer to organization policy.  7. Initiate consult with Child psychotherapist, Psychosocial CNS, Spiritual Care as appropriate  Outcome: Progressing     Problem: Drug Abuse/Detox  Goal: Will have no detox symptoms and will verbalize plan for changing drug-related behavior  Description: INTERVENTIONS:  1. Administer medication as ordered  2. Monitor physical  status  3. Provide emotional support with 1:1 interaction with staff  4. Encourage  recovery focused treatment   Outcome: Progressing     Pt presents with dull affect, depressed mood, with linear and goal-directed thought process. CIWA protocol in place, score of 0 this morning. Pt has been withdrawn to self on the unit. Pt displays appropriate boundaries on the unit, adhering to unit guidelines. Pt endorses SI, but denies intent or plan. Pt denies AVH. Pt is medication compliant.

## 2021-09-04 NOTE — Behavioral Health Treatment Team (Addendum)
Patient appeared to have slept  7 1/2 hrs.

## 2021-09-04 NOTE — Plan of Care (Signed)
Problem: Self Harm/Suicidality  Goal: Will have no self-injury during hospital stay  Description: INTERVENTIONS:  1.  Ensure constant observer at bedside with Q15M safety checks  2.  Maintain a safe environment  3.  Secure patient belongings  4.  Ensure family/visitors adhere to safety recommendations  5.  Ensure safety tray has been added to patient's diet order  6.  Every shift and PRN: Re-assess suicidal risk via Frequent Screener    09/04/2021 2102 by Randa Lynn, RN  Outcome: Progressing  09/04/2021 1022 by Virgia Land, RN  Outcome: Progressing   Pt. Is visible in the milieu. She is line of sight level of care. She is depressed, sad and anxious about her medical condition, re assurance and emotional support rendered.She is meal and medication compliant. She is free from falls, self harm or harming others.
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
## 2021-09-04 NOTE — H&P (Signed)
[facility]  FAMILY MEDICINE CONSULT NOTE FOR BEHAVIORAL HEALTH UNIT    Patient:    Vicki Osborne , 51 y.o. female   Room/Bed:  103/01  Admission Date:   09/02/2021  Code status:  Full Code      Reason for Consult: Admission to the [facility].  Psychiatry Attending Requesting Consult: Dr. Johnell Comings    ASSESSMENT:  Vicki Osborne is a 51 y.o. female w/ a PMH of Schizophrenia, bipolar, PTSD, HTN, eczema presenting for suicidal ideation and alchol intoxication who is now admitted to the [facility]. She also endorses intermittent right sided weakness, bloody bowel movement, and postmenopausal bleeding and was found to have transaminitis.     Nurse Chaperone during History and Physical: Designer, industrial/product, RN    PLAN:    Schizophrenia, bipolar, PTSD, Suicidal ideation, hx of suicidal attempts:  -Endorses auditory hallucinations.  -Endorsed SI at admission, but has improved.    -Patient reports taking Gabapentin, Lithium, Prazosin, and trazodone  -Per pharmacy records, she may be on Cogentin 0.5 mg daily, Gabapentin 300 mg in the morning/600 mg qhs, Oxcarbazepine 300 mg BID, Prazosin 2 mg qhs, Lithium XR 300 mg in the morning/600 mg qhs, Seroquel 200 mg qhs  Plan:  -Management per inpatient psychiatry    Alcohol use:  -Reports drinking 3-6 beers per day. Denies any other alcohol or drug use.   -Transaminitis present upon presentation and has persisted. See below.   Plan  -Continue CIWA protocol.  -B12 and folate labs ordered   -Supplement with thiamine and multivitamin     HTN  -BP elevated so far this admission reaching 160s/80s  -Reports taking Carvedilol and HCTZ for HTN  Plan  -Continue Carvedilol 12.98m BID and HCTZ 12.51m -Monitor BP    Transaminitis   -AST: 63671>245>809-ALT: 142>129>158  -Alk phos: 150>137>148  -Endorses 3-6 beers per day.  -Endorses some abdominal discomfort with large meals.   Plan  -Trend liver enzymes with daily CMP  -RUQ USK[place]ardered  -Hepatitis panel to  rule out Hep A, B, C  -INR lab ordered   -Continue Protonix    Intermittent right sided weakness  -For the past year, she has had episodes in which she develops pain in her right arms and legs then suddenly she collapses due to her right side given up on her. She reports that this only happens on the right side.  -She denies losing consciousness or any seizure like activity, but endorses some lightheadedness.   -CT head in ED WNL  Plan  -Once psychiatry stable, order carotid [place]-Also consider holter monitor, cardiac echo and bubble study, and CTA head    Bloody bowel movements  -Bright red blood per rectum with bowel movements.   -Started several months ago.  -Has them about 3x per week and each time its a hand full of blood.  -Her father died from colon cancer at 6273 -Has colonoscopy scheduled for next month  Plan  -Will need colonoscopy  -Continue daily CBC    Postmenopausal vaginal bleeding  -Patient reports developing vaginal bleeding yesterday in the ED.   -Speculum exam in ED showed dark blood coming out of the os per ED note.  -Patient has not had period in 4-5 years.   Plan  -Transvaginal [place] determine endometrial thickness and may need endometrial biopsy.   -Continue daily CBC    Elevated TSH:  -TSH 4.77  Plan  -Free T4 ordered  Eczema  -Reports using 2 different creams intermittently.  -No active lesions or rash  Plan  -No management at this time      Global  Diet: Low salt diet   Mobility: As tolerated with suicidal precaution      Thank you for this consult.       SUBJECTIVE:   Dr. Johnell Comings has consulted Family Medicine to evaluate Vicki Osborne  in the Emergency Department. She  is a 51 y.o. female w/ PMHx of Schizophrenia, bipolar, PTSD, HTN, eczema presenting for suicidal ideation and alchol intoxication who is now admitted to the [facility]. She also endorses intermittent right sided weakness, bloody bowel movement, and postmenopausal bleeding and was found to have  transaminitis.   Patients SI seams to steam from family stressors as she has several children. She also has auditory hallucinations. Patient has no homicidal ideation, and suicidal ideation has improved.  Patient reports drinking 3-6 beers per night and endorses drinking the day she came to the ED. She denies any illicit drug use. She does not smoke cigarettes, but she does vape nicotine.   Patient endorses some abdominal discomfort with large meals, but denies heartburn. She does endorse bright red blood per rectum with bowel movements that started several months ago and has them about 3x per week and each time its a hand full of blood. Her father died from colon cancer at 54. She has colonoscopy scheduled for next month. Patient also reports developing vaginal bleeding yesterday in the ED despite not having a period in 4-5 years. Speculum exam in ED showed dark blood coming out of the os per ED note.   For the past year, she has also had episodes in which she develops pain in her right arms and legs then suddenly she collapses due to her right side given up on her. She reports that this only happens on the right side. She denies losing consciousness or any seizure like activity, but endorses some lightheadedness.       CURRENT MEDICATIONS:  Current Facility-Administered Medications   Medication Dose Route Frequency Provider Last Rate Last Admin    LORazepam (ATIVAN) tablet 1 mg  1 mg Oral Q4H PRN Valetta Mole, MD   1 mg at 09/04/21 0247    LORazepam (ATIVAN) tablet 2 mg  2 mg Oral Q4H PRN Valetta Mole, MD   2 mg at 09/04/21 1210    lisinopril (PRINIVIL;ZESTRIL) tablet 10 mg  10 mg Oral Daily Valetta Mole, MD        hydrOXYzine pamoate (VISTARIL) capsule 25 mg  25 mg Oral Q4H PRN Valetta Mole, MD        traZODone (DESYREL) tablet 25 mg  25 mg Oral Nightly PRN Valetta Mole, MD   25 mg at 09/03/21 2212       ROS (positive findings are in BOLD; negative findings are in regular  font)  Constitutional: fevers, chills, appetite changes, weight changes, fatigue  HEENT: changes in vision, changes in hearing, sore throat, dysphagia  Cardiovascular: chest pain, palpitations, PND, orthopnea, edema  Pulmonary: SOB, cough, sputum production, wheezing, chest tightness  Gastrointestinal: abdominal pain, nausea/vomiting, diarrhea, constipation, melena, hematochezia  Genitourinary: dysuria, hesitation, dribbling, urgency, hematuria  Musculoskeletal: arthralgias, myalgias  Skin: rash, itching  Neurological: sensory changes, motor changes, headache  Psychiatric: mood changes  Endocrine: heat/cold intolerance  Heme: easy bruising/easy bleeding, LAD    HOME MEDICATIONS:   _0 @    MEDICAL HISTORY:  No past medical history on file.  Past Surgical History:   Procedure Laterality Date    APPENDECTOMY      TONSILLECTOMY AND ADENOIDECTOMY     Right meniscal repair    Family History   Problem Relation Age of Onset    Lung Cancer Mother     Breast Cancer Maternal Grandmother        Social History     Socioeconomic History    Marital status: Single   Tobacco Use    Smoking status: Former    Smokeless tobacco: Never   Substance and Sexual Activity    Alcohol use: Yes       Allergies   Allergen Reactions    Latex Hives           OBJECTIVE:   VITALS  Patient Vitals for the past 24 hrs:   Temp Pulse Resp BP SpO2   09/04/21 1200 98.1 ??F (36.7 ??C) 100 18 (!) 146/87 --   09/04/21 0814 97.1 ??F (36.2 ??C) 100 16 139/89 97 %   09/04/21 0249 -- 85 -- (!) 140/85 --   09/03/21 2208 97.7 ??F (36.5 ??C) 70 17 (!) 172/97 99 %   09/03/21 2004 -- 79 -- (!) 160/80 --   09/03/21 1826 97 ??F (36.1 ??C) 81 18 (!) 162/93 100 %       PHYSICAL EXAM  General: The patient appears comfortable, NAD.  HEENT: NCAT, PERRLA, EOM intact; oral mucosa well perfused, oropharynx clear  Neck: negative  CVS: regular rate and rhythm, S1, S2 normal, no murmur, click, rub or gallop  Lungs: chest clear, no wheezing, rales, normal symmetric air entry     Abdomen: Soft, non-distended, mild TTP, BS+, Hepatomegaly present  Ext: No calf tenderness, peripheral pulses present, no significant edema.  Skin: Warm, Dry, Intact , No significant rashes/petechia/ecchymosis  Neuro: No focal neurologic deficits or gross abnormalities  Psych: A&Ox3, appropriate mood, flat affect    LABS, IMAGING, AND DIAGNOSTIC STUDIES  Recent Results (from the past 24 hour(s))   Iron and TIBC    Collection Time: 09/04/21  9:15 AM   Result Value Ref Range    Iron 88 50 - 175 ug/dL    TIBC 361 250 - 450 ug/dL    Iron % Saturation 24 20 - 50 %   TSH    Collection Time: 09/04/21  9:15 AM   Result Value Ref Range    TSH, 3RD GENERATION 4.77 (H) 0.36 - 3.74 uIU/mL   Acetaminophen Level    Collection Time: 09/04/21  9:15 AM   Result Value Ref Range    Acetaminophen Level <2 (L) 10.0 - 30.0 ug/mL   Hepatic Function Panel    Collection Time: 09/04/21  9:15 AM   Result Value Ref Range    Total Protein 9.1 (H) 6.4 - 8.2 g/dL    Albumin 3.5 3.4 - 5.0 g/dL    Globulin 5.6 (H) 2.0 - 4.0 g/dL    Albumin/Globulin Ratio 0.6 (L) 0.8 - 1.7      Total Bilirubin 0.7 0.2 - 1.0 MG/DL    Bilirubin, Direct 0.2 0.0 - 0.2 MG/DL    Alk Phosphatase 148 (H) 45 - 117 U/L    AST 767 (H) 10 - 38 U/L    ALT 158 (H) 13 - 56 U/L       ================================================================  Assessment and recommendations will be discussed with my attending, who will provide final recommendations.       Jenean Lindau, DO PGY1  [facility]  September 04, 2021 1:28 PM

## 2021-09-04 NOTE — Other (Signed)
Crisis Note: On call psychiatrist made aware of elevated ammonia level of 62 umol/1. Orders received to repeat ammonia level in am.

## 2021-09-04 NOTE — Progress Notes (Addendum)
This nurse attempted to call [facility] for medication list, and pharmacist is currently on break.     [facility]:  Cogentin 0.5 mg daily  Gabapentin 300 mg in the morning/600 mg qhs  Oxcarbazepine 300 mg BID  Prazosin 2 mg qhs  Lithium XR 300 mg in the morning/600 mg qhs  Seroquel 200 mg qhs    MD made aware.     [facility] [address] provided medication list:  Pantoprazole 40 mg daily  Carvedilol 12.5 mg BID   Potassium Chloride XR 10 mEq daily  HCTZ 12.5 mg daily  Aspirin 81 mg daily    Pt also reports drinking tea with tumeric, ginger, black pepper, honey, and Mother Graham's vinegar every day for the last three months    While asking pt about medication list, pt reports that she wakes up every night in a puddle of sweat and immediately wakes up to use the restroom.

## 2021-09-05 ENCOUNTER — Inpatient Hospital Stay: Admit: 2021-09-05 | Payer: PRIVATE HEALTH INSURANCE | Primary: Internal Medicine

## 2021-09-05 LAB — CBC WITH AUTO DIFFERENTIAL
Absolute Immature Granulocyte: 0 10*3/uL (ref 0.00–0.04)
Basophils %: 0 % (ref 0–2)
Basophils Absolute: 0 10*3/uL (ref 0.0–0.1)
Eosinophils %: 3 % (ref 0–5)
Eosinophils Absolute: 0.1 10*3/uL (ref 0.0–0.4)
Hematocrit: 34.2 % — ABNORMAL LOW (ref 35.0–45.0)
Hemoglobin: 11.7 g/dL — ABNORMAL LOW (ref 12.0–16.0)
Immature Granulocytes: 0 % (ref 0.0–0.5)
Lymphocytes %: 23 % (ref 21–52)
Lymphocytes Absolute: 1.1 10*3/uL (ref 0.9–3.6)
MCH: 34.7 PG — ABNORMAL HIGH (ref 24.0–34.0)
MCHC: 34.2 g/dL (ref 31.0–37.0)
MCV: 101.5 FL — ABNORMAL HIGH (ref 78.0–100.0)
MPV: 9.7 FL (ref 9.2–11.8)
Monocytes %: 7 % (ref 3–10)
Monocytes Absolute: 0.3 10*3/uL (ref 0.05–1.2)
Neutrophils %: 66 % (ref 40–73)
Neutrophils Absolute: 3.3 10*3/uL (ref 1.8–8.0)
Nucleated RBCs: 0 PER 100 WBC
Platelets: 131 10*3/uL — ABNORMAL LOW (ref 135–420)
RBC: 3.37 M/uL — ABNORMAL LOW (ref 4.20–5.30)
RDW: 14.2 % (ref 11.6–14.5)
WBC: 4.9 10*3/uL (ref 4.6–13.2)
nRBC: 0 10*3/uL (ref 0.00–0.01)

## 2021-09-05 LAB — COMPREHENSIVE METABOLIC PANEL
ALT: 185 U/L — ABNORMAL HIGH (ref 13–56)
AST: 725 U/L — ABNORMAL HIGH (ref 10–38)
Albumin/Globulin Ratio: 0.6 — ABNORMAL LOW (ref 0.8–1.7)
Albumin: 3.9 g/dL (ref 3.4–5.0)
Alk Phosphatase: 155 U/L — ABNORMAL HIGH (ref 45–117)
Anion Gap: 4 mmol/L (ref 3.0–18)
BUN: 5 MG/DL — ABNORMAL LOW (ref 7.0–18)
Bun/Cre Ratio: 8 — ABNORMAL LOW (ref 12–20)
CO2: 27 mmol/L (ref 21–32)
Calcium: 9.9 MG/DL (ref 8.5–10.1)
Chloride: 103 mmol/L (ref 100–111)
Creatinine: 0.66 MG/DL (ref 0.6–1.3)
Est, Glom Filt Rate: >60 mL/min/{1.73_m2} (ref >60)
Globulin: 6.1 g/dL — ABNORMAL HIGH (ref 2.0–4.0)
Glucose: 126 mg/dL — ABNORMAL HIGH (ref 74–99)
Potassium: 3.4 mmol/L — ABNORMAL LOW (ref 3.5–5.5)
Sodium: 134 mmol/L — ABNORMAL LOW (ref 136–145)
Total Bilirubin: 0.7 MG/DL (ref 0.2–1.0)
Total Protein: 10 g/dL — ABNORMAL HIGH (ref 6.4–8.2)

## 2021-09-05 LAB — AMMONIA: Ammonia: 39 umol/L — ABNORMAL HIGH (ref 11–32)

## 2021-09-05 MED ORDER — IBUPROFEN 400 MG PO TABS
400 | Freq: Four times a day (QID) | ORAL | Status: DC | PRN
Start: 2021-09-05 — End: 2021-09-08
  Administered 2021-09-05 – 2021-09-07 (×3): 400 mg via ORAL

## 2021-09-05 MED ORDER — LACTULOSE 10 GM/15ML PO SOLN
1015 GM/15ML | Freq: Three times a day (TID) | ORAL | Status: DC
Start: 2021-09-05 — End: 2021-09-07
  Administered 2021-09-05 – 2021-09-07 (×6): 20 g via ORAL

## 2021-09-05 MED ORDER — GABAPENTIN 100 MG PO CAPS
100 MG | Freq: Every morning | ORAL | Status: DC
Start: 2021-09-05 — End: 2021-09-08
  Administered 2021-09-06 – 2021-09-08 (×3): 500 mg via ORAL

## 2021-09-05 MED FILL — LORAZEPAM 1 MG PO TABS: 1 MG | ORAL | Qty: 2

## 2021-09-05 MED FILL — LORAZEPAM 1 MG PO TABS: 1 MG | ORAL | Qty: 1

## 2021-09-05 MED FILL — POTASSIUM CHLORIDE CRYS ER 10 MEQ PO TBCR: 10 MEQ | ORAL | Qty: 1

## 2021-09-05 MED FILL — LITHIUM CARBONATE ER 300 MG PO TBCR: 300 MG | ORAL | Qty: 2

## 2021-09-05 MED FILL — NICOTINE 14 MG/24HR TD PT24: 14 MG/24HR | TRANSDERMAL | Qty: 1

## 2021-09-05 MED FILL — TRAZODONE HCL 50 MG PO TABS: 50 MG | ORAL | Qty: 1

## 2021-09-05 MED FILL — CARVEDILOL 3.125 MG PO TABS: 3.125 MG | ORAL | Qty: 4

## 2021-09-05 MED FILL — GABAPENTIN 300 MG PO CAPS: 300 MG | ORAL | Qty: 1

## 2021-09-05 MED FILL — LITHIUM CARBONATE ER 300 MG PO TBCR: 300 MG | ORAL | Qty: 1

## 2021-09-05 MED FILL — LACTULOSE 20 GM/30ML PO SOLN: 20 GM/30ML | ORAL | Qty: 30

## 2021-09-05 MED FILL — IBUPROFEN 400 MG PO TABS: 400 MG | ORAL | Qty: 1

## 2021-09-05 MED FILL — CHILDRENS ASPIRIN 81 MG PO CHEW: 81 MG | ORAL | Qty: 1

## 2021-09-05 MED FILL — PANTOPRAZOLE SODIUM 40 MG PO TBEC: 40 MG | ORAL | Qty: 1

## 2021-09-05 MED FILL — GABAPENTIN 300 MG PO CAPS: 300 MG | ORAL | Qty: 2

## 2021-09-05 MED FILL — HYDROCHLOROTHIAZIDE 12.5 MG PO CAPS: 12.5 MG | ORAL | Qty: 1

## 2021-09-05 MED FILL — LATUDA 40 MG PO TABS: 40 MG | ORAL | Qty: 1

## 2021-09-05 NOTE — Behavioral Health Treatment Team (Signed)
Pt appeared to have slept for 4.5 hours thus far during the night shift. Staff will continue to monitor Pt for safety and behavior on 1:1 status.

## 2021-09-05 NOTE — Behavioral Health Treatment Team (Signed)
Pt ciwa 5. Medicated per protocol w/ ativan 1 mg po.  Pt has been pleasant and cooperative. Endorses si, denies hi and avh. Was compliant w/ hs scheduled medications and ate hs snack. Will continue to support

## 2021-09-05 NOTE — Progress Notes (Addendum)
Chief Complaint: Severe recurrent major depression without psychotic features (HCC)     SUBJECTIVE:    The patient's chart was reviewed, she was discussed with nursing staff and she was seen during rounds. No acute events overnight. During rounds, the patient endorses pain that is "subtle" and further states, "I can bear it." Her most recent labs were discussed including elevated ammonia level. She reports having slept better and reports getting ~6hrs of sleep. She endorses irritability and anxiety that is "terrible." She reports tolerating Latuda without difficulty. She does endorse improved appetite. The patient continues to experience SI and states, "I don't want to be on medications. I don't want to be in pain." She denies plans or intentions of ending her life.     OBJECTIVE  Ammonia levels-39 on 09/05/21; 62 on 09/04/21  ALT-185  AST-725  Other labs reviewed     Medications  Current Facility-Administered Medications: lactulose (CHRONULAC) 10 GM/15ML solution 20 g, 20 g, Oral, TID  pantoprazole (PROTONIX) tablet 40 mg, 40 mg, Oral, QAM AC  carvedilol (COREG) tablet 12.5 mg, 12.5 mg, Oral, BID  hydroCHLOROthiazide (MICROZIDE) capsule 12.5 mg, 12.5 mg, Oral, Daily  aspirin chewable tablet 81 mg, 81 mg, Oral, Daily  potassium chloride (KLOR-CON M) extended release tablet 10 mEq, 10 mEq, Oral, Daily with breakfast  traZODone (DESYREL) tablet 50 mg, 50 mg, Oral, Nightly PRN  lurasidone (LATUDA) tablet 20 mg, 20 mg, Oral, QPM  gabapentin (NEURONTIN) capsule 300 mg, 300 mg, Oral, QAM  gabapentin (NEURONTIN) capsule 600 mg, 600 mg, Oral, QPM  lithium (LITHOBID) extended release tablet 300 mg, 300 mg, Oral, QAM  lithium (LITHOBID) extended release tablet 600 mg, 600 mg, Oral, QPM  nicotine (NICODERM CQ) 14 MG/24HR 1 patch, 1 patch, TransDERmal, Daily  LORazepam (ATIVAN) tablet 1 mg, 1 mg, Oral, Q4H PRN  LORazepam (ATIVAN) tablet 2 mg, 2 mg, Oral, Q4H PRN  hydrOXYzine pamoate (VISTARIL) capsule 25 mg, 25 mg, Oral, Q4H  PRN     Physical  BP 119/81    Pulse 76    Temp 98.8 ??F (37.1 ??C)    Resp 16    Ht 5\' 6"  (1.676 m)    Wt 145 lb (65.8 kg)    SpO2 96%    BMI 23.40 kg/m??     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 Increase Gabapentin to 500mg  daily and 600mg  qhs to target anxiety. Continue Lithium 300mg  qAM and 600mg  qhs, Prazosin 2mg  qhs. The patient will utilize Vistaril PRN.   Transaminitis being managed by [facility]. Lactulose added for elevated ammonia. Pending imaging of pelvis and liver.   Pain management was discussed in detail with the patient. She reports taking Motrin as an outpatient. Will order Motrin 400mg  PRN for pain. The risks of using ibuprofen was discussed in detail with the patient. Will monitor for elevated lithium levels. Will obtain lithium level  as the patient takes this as an outpatient.   Continue hospitalization.

## 2021-09-05 NOTE — Plan of Care (Signed)
Problem: Self Harm/Suicidality  Goal: Will have no self-injury during hospital stay  Description: INTERVENTIONS:  1.  Ensure constant observer at bedside with Q15M safety checks  2.  Maintain a safe environment  3.  Secure patient belongings  4.  Ensure family/visitors adhere to safety recommendations  5.  Ensure safety tray has been added to patient's diet order  6.  Every shift and PRN: Re-assess suicidal risk via Frequent Screener    Outcome: Progressing     Problem: Depression  Goal: Will be euthymic at discharge  Description: INTERVENTIONS:  1. Administer medication as ordered  2. Provide emotional support via 1:1 interaction with staff  3. Encourage involvement in milieu/groups/activities  4. Monitor for social isolation  Outcome: Progressing     Problem: Behavior  Goal: Pt/Family maintain appropriate behavior and adhere to behavioral management agreement, if implemented  Description: INTERVENTIONS:  1. Assess patient/family's coping skills and  non-compliant behavior (including use of illegal substances)  2. Notify security of behavior or suspected illegal substances which indicate the need for search of the family and/or belongings  3. Encourage verbalization of thoughts and concerns in a socially appropriate manner  4. Utilize positive, consistent limit setting strategies supporting safety of patient, staff and others  5. Encourage participation in the decision making process about the behavioral management agreement  6. If a visitor's behavior poses a threat to safety call refer to organization policy.  7. Initiate consult with Child psychotherapist, Psychosocial CNS, Spiritual Care as appropriate  Outcome: Progressing     Problem: Drug Abuse/Detox  Goal: Will have no detox symptoms and will verbalize plan for changing drug-related behavior  Description: INTERVENTIONS:  1. Administer medication as ordered  2. Monitor physical status  3. Provide emotional support with 1:1 interaction with staff  4. Encourage   recovery focused treatment   Outcome: Progressing   Patient has been visible in the milieu and her ultra sound was postpone because patient was not NPO after midnight. Patient is aware ,she should be NPO after mid-night to night. Patient was assessed per [facility]WA protocol and medicated with ativan, 2 mg ,PRN. Patient denies suicidal and homicidal ideations, passive death wish,pain, auditory and visual hallucinations. Will continue to monitor patient for health and safety and provide support. Will continue to montor patient for health and safety and provide support.

## 2021-09-06 ENCOUNTER — Inpatient Hospital Stay: Admit: 2021-09-06 | Payer: PRIVATE HEALTH INSURANCE | Primary: Internal Medicine

## 2021-09-06 LAB — COMPREHENSIVE METABOLIC PANEL
ALT: 180 U/L — ABNORMAL HIGH (ref 13–56)
AST: 494 U/L — ABNORMAL HIGH (ref 10–38)
Albumin/Globulin Ratio: 0.6 — ABNORMAL LOW (ref 0.8–1.7)
Albumin: 3.7 g/dL (ref 3.4–5.0)
Alk Phosphatase: 129 U/L — ABNORMAL HIGH (ref 45–117)
Anion Gap: 6 mmol/L (ref 3.0–18)
BUN: 5 MG/DL — ABNORMAL LOW (ref 7.0–18)
Bun/Cre Ratio: 10 — ABNORMAL LOW (ref 12–20)
CO2: 27 mmol/L (ref 21–32)
Calcium: 10.1 MG/DL (ref 8.5–10.1)
Chloride: 101 mmol/L (ref 100–111)
Creatinine: 0.52 MG/DL — ABNORMAL LOW (ref 0.6–1.3)
Est, Glom Filt Rate: >60 mL/min/{1.73_m2} (ref >60)
Globulin: 5.8 g/dL — ABNORMAL HIGH (ref 2.0–4.0)
Glucose: 97 mg/dL (ref 74–99)
Potassium: 3.6 mmol/L (ref 3.5–5.5)
Sodium: 134 mmol/L — ABNORMAL LOW (ref 136–145)
Total Bilirubin: 0.6 MG/DL (ref 0.2–1.0)
Total Protein: 9.5 g/dL — ABNORMAL HIGH (ref 6.4–8.2)

## 2021-09-06 LAB — CBC WITH AUTO DIFFERENTIAL
Absolute Immature Granulocyte: 0 10*3/uL (ref 0.00–0.04)
Basophils %: 1 % (ref 0–2)
Basophils Absolute: 0 10*3/uL (ref 0.0–0.1)
Eosinophils %: 4 % (ref 0–5)
Eosinophils Absolute: 0.2 10*3/uL (ref 0.0–0.4)
Hematocrit: 32.9 % — ABNORMAL LOW (ref 35.0–45.0)
Hemoglobin: 11.2 g/dL — ABNORMAL LOW (ref 12.0–16.0)
Immature Granulocytes: 0 % (ref 0.0–0.5)
Lymphocytes %: 27 % (ref 21–52)
Lymphocytes Absolute: 1.2 10*3/uL (ref 0.9–3.6)
MCH: 35 PG — ABNORMAL HIGH (ref 24.0–34.0)
MCHC: 34 g/dL (ref 31.0–37.0)
MCV: 102.8 FL — ABNORMAL HIGH (ref 78.0–100.0)
MPV: 9.9 FL (ref 9.2–11.8)
Monocytes %: 9 % (ref 3–10)
Monocytes Absolute: 0.4 10*3/uL (ref 0.05–1.2)
Neutrophils %: 59 % (ref 40–73)
Neutrophils Absolute: 2.6 10*3/uL (ref 1.8–8.0)
Nucleated RBCs: 0 PER 100 WBC
Platelets: 169 10*3/uL (ref 135–420)
RBC: 3.2 M/uL — ABNORMAL LOW (ref 4.20–5.30)
RDW: 14.2 % (ref 11.6–14.5)
WBC: 4.5 10*3/uL — ABNORMAL LOW (ref 4.6–13.2)
nRBC: 0 10*3/uL (ref 0.00–0.01)

## 2021-09-06 LAB — VAS DUP CAROTID BILATERAL
Body Surface Area: 1.75 m2
Left CCA dist EDV: 29.3 cm/s
Left CCA dist PSV: 94 cm/s
Left CCA mid EDV: 26.66 cm/s
Left CCA mid PSV: 92.66 cm/s
Left CCA prox EDV: 25.4 cm/s
Left CCA prox PSV: 99.1 cm/s
Left ECA EDV: 20.39 cm/s
Left ECA PSV: 83 cm/s
Left ICA dist EDV: 22.9 cm/s
Left ICA dist PSV: 62.8 cm/s
Left ICA mid EDV: 28 cm/s
Left ICA mid PSV: 77.1 cm/s
Left ICA prox EDV: 15.4 cm/s
Left ICA prox PSV: 53.8 cm/s
Left ICA/CCA PSV: 0.82 no units
Left subclavian prox EDV: 4.4 cm/s
Left subclavian prox PSV: 79.5 cm/s
Left vertebral EDV: 17.78 cm/s
Left vertebral PSV: 60.1 cm/s
Right CCA dist EDV: 18.1 cm/s
Right CCA mid EDV: 23.35 cm/s
Right CCA mid PSV: 91.17 cm/s
Right CCA prox EDV: 23.4 cm/s
Right CCA prox PSV: 106.8 cm/s
Right ECA EDV: 18.84 cm/s
Right ECA PSV: 86.9 cm/s
Right ICA dist EDV: 33.7 cm/s
Right ICA dist PSV: 81.7 cm/s
Right ICA mid EDV: 27.6 cm/s
Right ICA mid PSV: 77.3 cm/s
Right ICA prox EDV: 20 cm/s
Right ICA prox PSV: 56.6 cm/s
Right ICA/CCA PSV: 1 no units
Right cca dist PSV: 80.7 cm/s
Right subclavian prox EDV: 0 cm/s
Right subclavian prox PSV: 47.1 cm/s
Right vertebral EDV: 14.82 cm/s
Right vertebral PSV: 49.3 cm/s

## 2021-09-06 LAB — HEPATITIS PANEL, ACUTE
Hep A IgM: NEGATIVE
Hep B Core Ab, IgM: NEGATIVE
Hepatitis B Surface Ag: <0.1 Index (ref <1.00)
Hepatitis C Ab: 0.02 Index (ref <0.80)
Interpretation: NEGATIVE
Interpretation: NEGATIVE

## 2021-09-06 LAB — AMMONIA: Ammonia: 69 umol/L — ABNORMAL HIGH (ref 11–32)

## 2021-09-06 LAB — LITHIUM LEVEL: Lithium: 1 MMOL/L (ref 0.6–1.2)

## 2021-09-06 MED ORDER — POTASSIUM CHLORIDE CRYS ER 10 MEQ PO TBCR
10 MEQ | Freq: Two times a day (BID) | ORAL | Status: DC
Start: 2021-09-06 — End: 2021-09-08
  Administered 2021-09-06 – 2021-09-08 (×5): 20 meq via ORAL

## 2021-09-06 MED FILL — CHILDRENS ASPIRIN 81 MG PO CHEW: 81 MG | ORAL | Qty: 1

## 2021-09-06 MED FILL — POTASSIUM CHLORIDE CRYS ER 10 MEQ PO TBCR: 10 MEQ | ORAL | Qty: 2

## 2021-09-06 MED FILL — LACTULOSE 10 GM/15ML PO SOLN: 10 GM/15ML | ORAL | Qty: 30

## 2021-09-06 MED FILL — GABAPENTIN 300 MG PO CAPS: 300 MG | ORAL | Qty: 2

## 2021-09-06 MED FILL — CARVEDILOL 3.125 MG PO TABS: 3.125 MG | ORAL | Qty: 4

## 2021-09-06 MED FILL — NICOTINE 14 MG/24HR TD PT24: 14 MG/24HR | TRANSDERMAL | Qty: 1

## 2021-09-06 MED FILL — LITHIUM CARBONATE ER 300 MG PO TBCR: 300 MG | ORAL | Qty: 2

## 2021-09-06 MED FILL — LORAZEPAM 1 MG PO TABS: 1 MG | ORAL | Qty: 1

## 2021-09-06 MED FILL — LATUDA 40 MG PO TABS: 40 MG | ORAL | Qty: 1

## 2021-09-06 MED FILL — LITHIUM CARBONATE ER 300 MG PO TBCR: 300 MG | ORAL | Qty: 1

## 2021-09-06 MED FILL — TRAZODONE HCL 50 MG PO TABS: 50 MG | ORAL | Qty: 1

## 2021-09-06 MED FILL — GABAPENTIN 100 MG PO CAPS: 100 MG | ORAL | Qty: 1

## 2021-09-06 MED FILL — HYDROCHLOROTHIAZIDE 12.5 MG PO CAPS: 12.5 MG | ORAL | Qty: 1

## 2021-09-06 MED FILL — PANTOPRAZOLE SODIUM 40 MG PO TBEC: 40 MG | ORAL | Qty: 1

## 2021-09-06 MED FILL — LACTULOSE 20 GM/30ML PO SOLN: 20 GM/30ML | ORAL | Qty: 30
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
## 2021-09-06 NOTE — Plan of Care (Signed)
Problem: Self Harm/Suicidality  Goal: Will have no self-injury during hospital stay  Description: INTERVENTIONS:  1.  Ensure constant observer at bedside with Q15M safety checks  2.  Maintain a safe environment  3.  Secure patient belongings  4.  Ensure family/visitors adhere to safety recommendations  5.  Ensure safety tray has been added to patient's diet order  6.  Every shift and PRN: Re-assess suicidal risk via Frequent Screener    Outcome: Progressing   Pt. Is visible in the milieu. She is depressed with irritable mood. She complained of generalized pain, medication and emotional support provided. Pt. Is schedule for U/S of abdomen, this afternoon., hold lunch enforced. She remain line f sight for safety. She is free from falls, self harm or harming others. Will continue to monitor for safety and provide support as needed.

## 2021-09-06 NOTE — Other (Signed)
Art Therapy Group Progress Note     PATIENT SCHEDULED FOR GROUP AT:    1:55 pm    GROUP STOP TIME:  2:35 pm    ATTENDANCE: half (3/7 participants STU1)    PARTICIPATION LEVEL: Pt attended group, participated in art making, and appropriately contributed to group discussion.     ATTENTION LEVEL: Pt followed directions and was invested in art making.     TOPIC / FOCUS: Strengths Tree    SYMBOLIC & THEMATIC CONTENT AS NOTED IN IMAGERY: Pt listed a mixture of strengths and difficult emotions. Pt listed cleaning, surviving, understanding, loving kids, enjoyable company as her strengths. Pt listed pain, not caring, anger on her tree of strength. When the Pt began adding detail to her tree, she perseverated on making blood drips on the trunk of the tree. She wrote that the blood was drip drip into the dirt below.   The art therapist pointed out that even though her trunk looked very painful there were many leaves and strengths blossoming from it. The Pt reported wanting to attended soup kitchens and help others by sharing her story to aid in her personal recovery.     Jeannette How, MA   Art Therapist

## 2021-09-06 NOTE — Other (Signed)
Art Therapy Group Progress Note     PATIENT SCHEDULED FOR GROUP AT:    10:10 am    GROUP STOP TIME:  10:45 am    ATTENDANCE: one third (5/15 participants)     PARTICIPATION LEVEL: Pt attended group, participated in art making, and contributed to conversation with another group member.     ATTENTION LEVEL:  Pt was invested in art making and completed the art task.    TOPIC / FOCUS: Mindfulness/ motivational word or feeling check-in     SYMBOLIC & THEMATIC CONTENT AS NOTED IN IMAGERY: Pt was late to group, due to contacting her place of employment. The art therapist was attending to another group member when the Pt entered group. Pt picked a mandala from the art caddy and immediately began to color. Pt was resistant to choosing a motivational word. Instead, the Pt focused on her ???ill as of mental??? listing pain, death, lost hope, fear, not healing, and hopeless. Pt reported that her mandala was filled with her current feelings.  Pt was provided a list of positive affirmations to provide her with positive thoughts to ruminate over.     In conversation with another group member Pt verbalized the intention to really work on staying sober. Pt said she was willing to work the program, attend meetings, go to rehab, and removed herself from her peers that were a bad influence.         Jeannette How, MA   Art Therapist

## 2021-09-06 NOTE — Behavioral Health Treatment Team (Signed)
Pt on los for high fall risk. On ciwa for etoh withdrawal, continues to c/o of minimal withdrawal symptoms, see associated flow sheet.  Was medicated with ativan per protocol. Given bag of ice for her joint pain. Otherwise, cooperative. Continues to endorse si but does cfs. Denies hi and avh.  Will continue to support

## 2021-09-06 NOTE — Progress Notes (Signed)
Patient was cooperative after initial encounter with this Clinical research associate. She turned the lights off and went to sleep.

## 2021-09-06 NOTE — Other (Signed)
BH Biopsychosocial Assessment    Current Level of Psychosocial Functioning     [x] Independent  [] Dependent  [] Minimal Assist      Comments:      Psychosocial High Risk Factors (check all that apply)      [] Unable to obtain meds                                                               [] Chronic illness/pain    [x] Substance abuse   [] Lack of Family Support   [x] Financial stress   [] Isolation   [x] Inadequate Community Resources  [x] Suicide attempt(s)  [] Not taking medications   [] Victim of crime   [] Developmental Delay  [] Unable to manage personal needs    [] Age 49 or older   []   Homeless  [] INo transportation   [] Readmission within 30 days  [] Unemployment  [x] Traumatic Event        Psychiatric Advanced Directive: NO      Family to involve in treatment: Pt. has supportive daughter       Sexual Orientation:  Heterosexual     Patient Strengths: Pt. Is willing to seek help, has fair insight      Patient Barriers: Has chronic pain, poor coping skills and self-medicates      Opiate education provided: Pt. abuses alcohol . SW discussed SA residential        Safety plan: Pt. Contracts for safety and denies ideations.      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 Pt. is a 51 year old female with history of major depressive disorder, recurrent severe with psychotic features-r/o bipolar disorder alcohol use disorder, moderate to severe post-traumatic stress disorder. Pt. was hospitalized for ideations to harm self with a plan  to overdose and alcohol in toxification. Pt. 's case was discussed in staffing. Pt is complaining about abdomen pain, chronic pain and occasionally her leg gives away. Pt  uses alcohol to cope with the pain. Pt. was provided with SA and mental health education. Pt. declined SA residential. Pt. stated she will follow up outpt services with . SW discussed self-efficacy, safety plan and mental skill building services.   Pt. is helpless and has poor insight. SW will continue to provide pt with support towards dc planning.    MA,LMHP-R

## 2021-09-06 NOTE — Progress Notes (Signed)
Pt. Was taken to radiology dept. For U/S of abdomen via wheelchair accompanied by [facility] Markham Jordan and transport team.

## 2021-09-06 NOTE — Behavioral Health Treatment Team (Signed)
Patient slept approximately  7-hours during the night shift.`There were not any behavior issues. Patient is monitor for safety and support.

## 2021-09-06 NOTE — Behavioral Health Treatment Team (Signed)
M.H.T Note - Pt is on line of sight for safety. Pt ate breakfast and attended morning group. Pt has been spending the majority of the day in the day area watching TV. Pt is pleasant with staff and does not exhibit any behavioral issues at this time. Pt was not able to eat lunch due to having to fast for an upcoming test. Pt has had 0 falls. Writer will continue to monitor patient for behavior and safety through the rest of shift.

## 2021-09-06 NOTE — Progress Notes (Addendum)
Off going report received from evening RN  Pt. Had not been problematic on previous shift. This Clinical research associate entered room to introduce myself to patient. She had her gown open in the front exposing her chest and abdomen. She stated ," Don't come in here and open my door all the way cause I like my gown like this , I am hot and this is the way I sleep.." Explain to patient that she was on LOS for safety. She replied, " "You are the only one saying anything to me so don't start." Irritable and mumbling to herself at times.

## 2021-09-07 LAB — CBC WITH AUTO DIFFERENTIAL
Absolute Immature Granulocyte: 0 10*3/uL (ref 0.00–0.04)
Basophils %: 1 % (ref 0–2)
Basophils Absolute: 0 10*3/uL (ref 0.0–0.1)
Eosinophils %: 3 % (ref 0–5)
Eosinophils Absolute: 0.2 10*3/uL (ref 0.0–0.4)
Hematocrit: 34.5 % — ABNORMAL LOW (ref 35.0–45.0)
Hemoglobin: 11.5 g/dL — ABNORMAL LOW (ref 12.0–16.0)
Immature Granulocytes: 0 % (ref 0.0–0.5)
Lymphocytes %: 20 % — ABNORMAL LOW (ref 21–52)
Lymphocytes Absolute: 1.1 10*3/uL (ref 0.9–3.6)
MCH: 34.4 PG — ABNORMAL HIGH (ref 24.0–34.0)
MCHC: 33.3 g/dL (ref 31.0–37.0)
MCV: 103.3 FL — ABNORMAL HIGH (ref 78.0–100.0)
MPV: 9.8 FL (ref 9.2–11.8)
Monocytes %: 10 % (ref 3–10)
Monocytes Absolute: 0.6 10*3/uL (ref 0.05–1.2)
Neutrophils %: 66 % (ref 40–73)
Neutrophils Absolute: 3.6 10*3/uL (ref 1.8–8.0)
Nucleated RBCs: 0 PER 100 WBC
Platelets: 158 10*3/uL (ref 135–420)
RBC: 3.34 M/uL — ABNORMAL LOW (ref 4.20–5.30)
RDW: 14.2 % (ref 11.6–14.5)
WBC: 5.5 10*3/uL (ref 4.6–13.2)
nRBC: 0 10*3/uL (ref 0.00–0.01)

## 2021-09-07 LAB — COMPREHENSIVE METABOLIC PANEL
ALT: 163 U/L — ABNORMAL HIGH (ref 13–56)
AST: 338 U/L — ABNORMAL HIGH (ref 10–38)
Albumin/Globulin Ratio: 0.6 — ABNORMAL LOW (ref 0.8–1.7)
Albumin: 3.8 g/dL (ref 3.4–5.0)
Alk Phosphatase: 135 U/L — ABNORMAL HIGH (ref 45–117)
Anion Gap: 5 mmol/L (ref 3.0–18)
BUN: 5 MG/DL — ABNORMAL LOW (ref 7.0–18)
Bun/Cre Ratio: 7 — ABNORMAL LOW (ref 12–20)
CO2: 29 mmol/L (ref 21–32)
Calcium: 10.6 MG/DL — ABNORMAL HIGH (ref 8.5–10.1)
Chloride: 101 mmol/L (ref 100–111)
Creatinine: 0.68 MG/DL (ref 0.6–1.3)
Est, Glom Filt Rate: >60 mL/min/{1.73_m2} (ref >60)
Globulin: 6 g/dL — ABNORMAL HIGH (ref 2.0–4.0)
Glucose: 108 mg/dL — ABNORMAL HIGH (ref 74–99)
Potassium: 3.7 mmol/L (ref 3.5–5.5)
Sodium: 135 mmol/L — ABNORMAL LOW (ref 136–145)
Total Bilirubin: 0.5 MG/DL (ref 0.2–1.0)
Total Protein: 9.8 g/dL — ABNORMAL HIGH (ref 6.4–8.2)

## 2021-09-07 MED ORDER — MAGNESIUM CITRATE PO SOLN
Freq: Once | ORAL | Status: DC
Start: 2021-09-07 — End: 2021-09-07

## 2021-09-07 MED ORDER — MAGNESIUM HYDROXIDE 400 MG/5ML PO SUSP
4005 MG/5ML | Freq: Every day | ORAL | Status: DC | PRN
Start: 2021-09-07 — End: 2021-09-08
  Administered 2021-09-07: 19:00:00 30 mL via ORAL

## 2021-09-07 MED ORDER — LACTULOSE 10 GM/15ML PO SOLN
10 GM/15ML | Freq: Three times a day (TID) | ORAL | Status: DC
Start: 2021-09-07 — End: 2021-09-08
  Administered 2021-09-07 – 2021-09-08 (×5): 30 g via ORAL

## 2021-09-07 MED FILL — LITHIUM CARBONATE ER 300 MG PO TBCR: 300 MG | ORAL | Qty: 2

## 2021-09-07 MED FILL — LORAZEPAM 1 MG PO TABS: 1 MG | ORAL | Qty: 1

## 2021-09-07 MED FILL — MILK OF MAGNESIA 7.75 % PO SUSP: 7.75 % | ORAL | Qty: 30

## 2021-09-07 MED FILL — NICOTINE 14 MG/24HR TD PT24: 14 MG/24HR | TRANSDERMAL | Qty: 1

## 2021-09-07 MED FILL — HYDROXYZINE PAMOATE 25 MG PO CAPS: 25 MG | ORAL | Qty: 1

## 2021-09-07 MED FILL — LATUDA 40 MG PO TABS: 40 MG | ORAL | Qty: 1

## 2021-09-07 MED FILL — LACTULOSE 20 GM/30ML PO SOLN: 20 GM/30ML | ORAL | Qty: 30

## 2021-09-07 MED FILL — HYDROCHLOROTHIAZIDE 12.5 MG PO CAPS: 12.5 MG | ORAL | Qty: 1

## 2021-09-07 MED FILL — GABAPENTIN 100 MG PO CAPS: 100 MG | ORAL | Qty: 1

## 2021-09-07 MED FILL — CHILDRENS ASPIRIN 81 MG PO CHEW: 81 MG | ORAL | Qty: 1

## 2021-09-07 MED FILL — IBUPROFEN 400 MG PO TABS: 400 MG | ORAL | Qty: 1

## 2021-09-07 MED FILL — POTASSIUM CHLORIDE CRYS ER 10 MEQ PO TBCR: 10 MEQ | ORAL | Qty: 2

## 2021-09-07 MED FILL — TRAZODONE HCL 50 MG PO TABS: 50 MG | ORAL | Qty: 1

## 2021-09-07 MED FILL — CARVEDILOL 3.125 MG PO TABS: 3.125 MG | ORAL | Qty: 4

## 2021-09-07 MED FILL — PANTOPRAZOLE SODIUM 40 MG PO TBEC: 40 MG | ORAL | Qty: 1

## 2021-09-07 MED FILL — LITHIUM CARBONATE ER 300 MG PO TBCR: 300 MG | ORAL | Qty: 1

## 2021-09-07 MED FILL — GABAPENTIN 300 MG PO CAPS: 300 MG | ORAL | Qty: 2

## 2021-09-07 NOTE — Progress Notes (Signed)
Behavioral Health Progress Note    Admit Date: 09/02/2021      Vitals : Patient Vitals for the past 8 hrs:   BP Temp Pulse Resp SpO2   09/07/21 0759 128/78 99.2 ??F (37.3 ??C) 80 16 95 %   09/07/21 0537 -- 98.3 ??F (36.8 ??C) -- -- --     Labs:    Recent Results (from the past 24 hour(s))   Comprehensive Metabolic Panel    Collection Time: 09/07/21  8:56 AM   Result Value Ref Range    Sodium 135 (L) 136 - 145 mmol/L    Potassium 3.7 3.5 - 5.5 mmol/L    Chloride 101 100 - 111 mmol/L    CO2 29 21 - 32 mmol/L    Anion Gap 5 3.0 - 18 mmol/L    Glucose 108 (H) 74 - 99 mg/dL    BUN 5 (L) 7.0 - 18 MG/DL    Creatinine 0.68 0.6 - 1.3 MG/DL    Bun/Cre Ratio 7 (L) 12 - 20      Est, Glom Filt Rate >60 >60 ml/min/1.74m    Calcium 10.6 (H) 8.5 - 10.1 MG/DL    Total Bilirubin 0.5 0.2 - 1.0 MG/DL    ALT 163 (H) 13 - 56 U/L    AST 338 (H) 10 - 38 U/L    Alk Phosphatase 135 (H) 45 - 117 U/L    Total Protein 9.8 (H) 6.4 - 8.2 g/dL    Albumin 3.8 3.4 - 5.0 g/dL    Globulin 6.0 (H) 2.0 - 4.0 g/dL    Albumin/Globulin Ratio 0.6 (L) 0.8 - 1.7     CBC with Auto Differential    Collection Time: 09/07/21  8:56 AM   Result Value Ref Range    WBC 5.5 4.6 - 13.2 K/uL    RBC 3.34 (L) 4.20 - 5.30 M/uL    Hemoglobin 11.5 (L) 12.0 - 16.0 g/dL    Hematocrit 34.5 (L) 35.0 - 45.0 %    MCV 103.3 (H) 78.0 - 100.0 FL    MCH 34.4 (H) 24.0 - 34.0 PG    MCHC 33.3 31.0 - 37.0 g/dL    RDW 14.2 11.6 - 14.5 %    Platelets 158 135 - 420 K/uL    MPV 9.8 9.2 - 11.8 FL    Nucleated RBCs 0.0 0 PER 100 WBC    nRBC 0.00 0.00 - 0.01 K/uL    Seg Neutrophils 66 40 - 73 %    Lymphocytes 20 (L) 21 - 52 %    Monocytes 10 3 - 10 %    Eosinophils % 3 0 - 5 %    Basophils 1 0 - 2 %    Immature Granulocytes 0 0.0 - 0.5 %    Segs Absolute 3.6 1.8 - 8.0 K/UL    Absolute Lymph # 1.1 0.9 - 3.6 K/UL    Absolute Mono # 0.6 0.05 - 1.2 K/UL    Absolute Eos # 0.2 0.0 - 0.4 K/UL    Basophils Absolute 0.0 0.0 - 0.1 K/UL    Absolute Immature Granulocyte 0.0 0.00 - 0.04 K/UL    Differential Type  AUTOMATED       Meds:   Current Facility-Administered Medications   Medication Dose Route Frequency    lactulose (CHRONULAC) 10 GM/15ML solution 30 g  30 g Oral TID    magnesium hydroxide (MILK OF MAGNESIA) 400 MG/5ML suspension 30 mL  30 mL Oral Daily PRN  potassium chloride (KLOR-CON M) extended release tablet 20 mEq  20 mEq Oral BID WC    gabapentin (NEURONTIN) capsule 500 mg  500 mg Oral QAM    ibuprofen (ADVIL;MOTRIN) tablet 400 mg  400 mg Oral Q6H PRN    pantoprazole (PROTONIX) tablet 40 mg  40 mg Oral QAM AC    carvedilol (COREG) tablet 12.5 mg  12.5 mg Oral BID    hydroCHLOROthiazide (MICROZIDE) capsule 12.5 mg  12.5 mg Oral Daily    aspirin chewable tablet 81 mg  81 mg Oral Daily    traZODone (DESYREL) tablet 50 mg  50 mg Oral Nightly PRN    lurasidone (LATUDA) tablet 20 mg  20 mg Oral QPM    gabapentin (NEURONTIN) capsule 600 mg  600 mg Oral QPM    lithium (LITHOBID) extended release tablet 300 mg  300 mg Oral QAM    lithium (LITHOBID) extended release tablet 600 mg  600 mg Oral QPM    nicotine (NICODERM CQ) 14 MG/24HR 1 patch  1 patch TransDERmal Daily    LORazepam (ATIVAN) tablet 1 mg  1 mg Oral Q4H PRN    LORazepam (ATIVAN) tablet 2 mg  2 mg Oral Q4H PRN    hydrOXYzine pamoate (VISTARIL) capsule 25 mg  25 mg Oral Q4H PRN      Hospital Problems: Principal Problem:    Bipolar II disorder, most recent episode major depressive ([place])  Resolved Problems:    * No resolved hospital problems. *      Subjective:   Medication side effects: none      Mental Status Exam  Sensorium: alert  Orientation: only aware of time, place, and person  Relations: cooperative  Eye Contact: poor  Appearance: is tense  Thought Process: fast rate of thoughts, poor abstract reasoning/computation, and logical    Thought Content: obsessions    Suicidal: denies   Homicidal: none   Mood: is irritable and unhappy   Affect: stable  Memory: shows no evidence of impairment     Concentration: distractable  Abstraction: concrete  Insight:  The patient shows little insight    OR Fair  Judgement: is psychologically impaired OR  Fair    Assessment/Plan:   not changed    Continue close observation,    Treatment team note patient had treatment team attended by physician, nurse, social worker, patient.  She again insists that part of the reason she is here is because of her chronic pain and she wants pain management set up.  We again reinforced that that is not something that we do in psychiatry since we do not have any access to pain management resources at all.  She complains of abdominal leg and body pain that this is not new.  She is supposed to see her primary care provider about this has appointment with Dr. Theressa Millard.  She continues to complain that she has constipation.  This is not new and that she says this has been happening episodically over the past year and has been taking combination magnesium citrate with Milk of Magnesia.  We did try to get access to magnesium citrate but pharmacy says is not available and suggested instead we use the Milk of Magnesia which we wrote for.  She does say she does not want residential substance abuse treatment because she has two teenage children.  She does agree to go to intensive outpatient and has been referred to program with Dr. Glennon Mac in [place].  She also had previously been  getting gabapentin from Dr. Rolena Infante the [facility].  She says she had spoken to Dr. Elizabeth Palau when admitted here and Dr. Elizabeth Palau said that she would be seeing her for her medication management as an outpatient.  We will otherwise continue medications as is and if we get her to where she can have a bowel movement we will be looking for discharge.

## 2021-09-07 NOTE — Group Note (Signed)
Group Therapy Note    Date: 09/07/2021    Group Start Time: 2000  Group End Time: 2030  Group Topic: Medication    [facility] T[facility] Bergman, RN        Group Therapy Note    Attendees: 6       Patient's Goal:  Understanding the importance of maintaining medication compliance and daily reflection.    Notes:  Pt has been in day area watching tv and interacting appropriately with peers.  Remains compliant with meds and verbalized understanding of medication education.  Denied si/hi/avh during time of assessment.  pt was active during 1:1 and was able to reflect on day.  Pt reported "I had time to think today and I feel positive with the plan."  Pt explained that she has been dealing with chronic pain and frequent falls.  Pt on line of sight due to high fall risk.   All needs assessed and met.  Will continue to monitor and support as needed.    Status After Intervention:  Improved    Participation Level: Active Listener    Participation Quality: Appropriate and Attentive      Speech:  normal      Thought Process/Content: Logical      Affective Functioning: Congruent      Mood: euthymic      Level of consciousness:  Oriented x4      Response to Learning: Able to verbalize current knowledge/experience, Able to retain information, Capable of insight, and Progressing to goal      Endings: None Reported    Modes of Intervention: Education and Support      Discipline Responsible: Registered Nurse      Signature:  Willaim Bane, RN

## 2021-09-07 NOTE — Other (Signed)
Pt. is a 51 year old female with history of major depressive disorder, recurrent severe with psychotic features-r/o bipolar disorder alcohol use disorder, moderate to severe post-traumatic stress disorder. Pt. was hospitalized for ideations to harm self with a plan to overdose and alcohol intoxification.       Pt. Was discussed in staffing and treatment team. Pt. Stated she has chronic pain and has moments in the past were the pain  is so  bad , she is unable to get up . Pt reports she self medicates with the alcohol to help numb the pain . Pt has been complaining of abdomen pain but shared with the team she has not had a bowel moment.  The team discussed safety plan, encouraged pt to follow up with outpt PCP to be referred to pain management, attend an IOP and follow-up and through with oupt services with [facility]. SW will refer pt to IOP with Mrs. Jean Rosenthal at [facility] . Pt. Denies ideations and hallucinations. Pt. would like to be dc tomorrow. Pt. Has little insight. Pt. Will be provided support towards dc planning.         Caprice Beaver MA, LMHP-R

## 2021-09-07 NOTE — Progress Notes (Signed)
The patient is quiet in her room at present.  She is preparing to come out for dinner.  She is alert and orientated and denies thoughts of harm to self or others.  Continuing to monitor, and will continue to provide support.

## 2021-09-07 NOTE — Progress Notes (Addendum)
Pt was given ativan 1 mg. Po for ciwa=5 and motrin 400 mg.  Po for general aching.

## 2021-09-07 NOTE — Progress Notes (Deleted)
Pt score a 5 on ciwa score. She also reports generalized pain. She ws given ativan 1 mg. Po for etoh withdrawal. And motrin 400 mg. Po for generalized aching.Vicki Osborne

## 2021-09-07 NOTE — Plan of Care (Signed)
Problem: Self Harm/Suicidality  Goal: Will have no self-injury during hospital stay  Description: INTERVENTIONS:  1.  Ensure constant observer at bedside with Q15M safety checks  2.  Maintain a safe environment  3.  Secure patient belongings  4.  Ensure family/visitors adhere to safety recommendations  5.  Ensure safety tray has been added to patient's diet order  6.  Every shift and PRN: Re-assess suicidal risk via Frequent Screener    Outcome: Progressing     Problem: Depression  Goal: Will be euthymic at discharge  Description: INTERVENTIONS:  1. Administer medication as ordered  2. Provide emotional support via 1:1 interaction with staff  3. Encourage involvement in milieu/groups/activities  4. Monitor for social isolation  Outcome: Progressing     Problem: Pain  Goal: Verbalizes/displays adequate comfort level or baseline comfort level  Outcome: Progressing   Patient is pleasant, mood is calm. Denies SI. Attending groups.

## 2021-09-07 NOTE — Progress Notes (Signed)
Pt has had brief interrupted sleep but did go back to sleep quickly. No overt signs of etoh withdrawal.

## 2021-09-08 MED ORDER — PANTOPRAZOLE SODIUM 40 MG PO TBEC
40 MG | ORAL_TABLET | Freq: Every day | ORAL | 0 refills | Status: AC
Start: 2021-09-08 — End: ?

## 2021-09-08 MED ORDER — ASPIRIN 81 MG PO CHEW
81 MG | ORAL_TABLET | Freq: Every day | ORAL | 3 refills | Status: AC
Start: 2021-09-08 — End: ?

## 2021-09-08 MED ORDER — LITHIUM CARBONATE ER 300 MG PO TBCR
300 MG | ORAL_TABLET | Freq: Every morning | ORAL | 0 refills | Status: AC
Start: 2021-09-08 — End: ?

## 2021-09-08 MED ORDER — HYDROCHLOROTHIAZIDE 12.5 MG PO CAPS
12.5 MG | ORAL_CAPSULE | Freq: Every day | ORAL | 0 refills | Status: AC
Start: 2021-09-08 — End: ?

## 2021-09-08 MED ORDER — LURASIDONE HCL 20 MG PO TABS
20 MG | ORAL_TABLET | Freq: Every evening | ORAL | 0 refills | Status: AC
Start: 2021-09-08 — End: ?

## 2021-09-08 MED ORDER — GABAPENTIN 300 MG PO CAPS
300 MG | ORAL_CAPSULE | Freq: Every evening | ORAL | 0 refills | Status: AC
Start: 2021-09-08 — End: 2021-10-08

## 2021-09-08 MED ORDER — LITHIUM CARBONATE ER 300 MG PO TBCR
300 MG | ORAL_TABLET | Freq: Every evening | ORAL | 0 refills | Status: AC
Start: 2021-09-08 — End: ?

## 2021-09-08 MED ORDER — GABAPENTIN 100 MG PO CAPS
100 MG | ORAL_CAPSULE | Freq: Every morning | ORAL | 0 refills | Status: AC
Start: 2021-09-08 — End: 2021-10-09

## 2021-09-08 MED ORDER — IBUPROFEN 400 MG PO TABS
400 MG | ORAL_TABLET | Freq: Four times a day (QID) | ORAL | 3 refills | Status: AC | PRN
Start: 2021-09-08 — End: ?

## 2021-09-08 MED FILL — NICOTINE 14 MG/24HR TD PT24: 14 MG/24HR | TRANSDERMAL | Qty: 1

## 2021-09-08 MED FILL — LITHIUM CARBONATE ER 300 MG PO TBCR: 300 MG | ORAL | Qty: 1

## 2021-09-08 MED FILL — CARVEDILOL 3.125 MG PO TABS: 3.125 MG | ORAL | Qty: 4

## 2021-09-08 MED FILL — TRAZODONE HCL 50 MG PO TABS: 50 MG | ORAL | Qty: 1

## 2021-09-08 MED FILL — POTASSIUM CHLORIDE CRYS ER 10 MEQ PO TBCR: 10 MEQ | ORAL | Qty: 2

## 2021-09-08 MED FILL — HYDROCHLOROTHIAZIDE 12.5 MG PO CAPS: 12.5 MG | ORAL | Qty: 1

## 2021-09-08 MED FILL — HYDROXYZINE PAMOATE 25 MG PO CAPS: 25 MG | ORAL | Qty: 1

## 2021-09-08 MED FILL — PANTOPRAZOLE SODIUM 40 MG PO TBEC: 40 MG | ORAL | Qty: 1

## 2021-09-08 MED FILL — GABAPENTIN 100 MG PO CAPS: 100 MG | ORAL | Qty: 1

## 2021-09-08 MED FILL — LACTULOSE 20 GM/30ML PO SOLN: 20 GM/30ML | ORAL | Qty: 30

## 2021-09-08 MED FILL — CHILDRENS ASPIRIN 81 MG PO CHEW: 81 MG | ORAL | Qty: 1
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
## 2021-09-08 NOTE — Other (Addendum)
Social Work Group  09/08/21    Communication Styles Group     Attendance   Actively Participated   Number of participants  9   Time in  10:30 am   Time out  11:15 am   Total Time  45  mins    Interaction  Present    Interventions/techniques  Informed and encouraged, Provided positive feedback and support           Lourena Simmonds, MSW  Child psychotherapist

## 2021-09-08 NOTE — Other (Signed)
Art Therapy Group Progress Note     PATIENT SCHEDULED FOR GROUP AT:    1:45 pm    GROUP STOP TIME:  2:20 pm    ATTENDANCE: Full (5/5 participants STU1)    PARTICIPATION LEVEL: Pt attended group, participated in art making, and contributed appropriately to group discussion.     ATTENTION LEVEL: Pt was able to follow directions, complete art task, and invested in artmaking.     TOPIC / FOCUS: Positive Thinking/ Coping Skills - What brings you Joy?     SYMBOLIC & THEMATIC CONTENT AS NOTED IN IMAGERY:  Pt began the art making process with a very critical mindset and low self-esteem perspective. Pt said, ???I don't know if you can tell what this is???, ???I can't draw circles???, and ???this is maybe the worst thing I have ever seen. Pt was encouraged by the art therapist and group members. Although Pt was being hard on herself, she persevered and shared her completed artwork with the group. Pt displayed positive affect through out the group interaction. Pt reported being with her family at the dinner table as an activity that brings her joy. Pt drew her dinning room table from an above perspective.     Jeannette How, MA   Art Therapist

## 2021-09-08 NOTE — Progress Notes (Signed)
Post menopausal vaginal bleeding:  Transvaginal US showed a thickened endometrial stripe with possible focal hyperechoic  endometrial lesion.  -Ddx: endometrial polyp, hyperplasia, endometrial carcinoma.   -Plan: Will need OB/GYN referral for tissue sampling and/or hysterosonogram.    Hematochezia with FH colon cancer:  -Plan: Colonoscopy as outpatient.    Transaminitis:   -AST and ALT trending down  -Liver US:   -Cholelithiasis, likely biliary sludge, nonspecific gallbladder distention. No definite evidence of acute cholecystitis.   -Hepatomegaly. Diffusely increased hepatic echogenicity and coarsened echotexture  -Ddx: Hepatic steatosis vs hepatocellular disease.  -Plan: No acute management at this time. Consider GI referral as outpatient.       Harlen Labs, DO PGY1  [facility]  September 08, 2021 6:25 AM

## 2021-09-08 NOTE — Progress Notes (Signed)
Pt discharged from [facility].  Pt is in good spirits, smiling when she interacts with staff and peers.    Pt denies SI/HI/SH thoughts at this time.  Pt denies ALL hallucinations.  Pt in possession of all of her belongings, dc paperwork, and follow up instructions.    Pt walked down to the front of the hospital by [facility] staff.    No concerns at time of dischage

## 2021-09-08 NOTE — Progress Notes (Signed)
Pt sitting quietly in the day room, participating in art group.

## 2021-09-08 NOTE — Other (Signed)
Pt. is a 51 year old female with history of major depressive disorder, recurrent severe with psychotic features-r/o bipolar disorder alcohol use disorder, moderate to severe post-traumatic stress disorder. Pt. was hospitalized for ideations to harm self with a plan to overdose and alcohol intoxification.       Pt.'s case was dicussed in staffing. Pt will be dc today to her home address. SW met with pt to discuss dc. Pt. Expressed to feeling better emotionally and slightly better physically. Pt. Denies ideations and hallucinations. Pt. Is encouraged to follow up with Pt.has an appointment with Dr. Elizabeth Palau on  10/06/21 @ 3:00 pm [facility]   [address] [place] [place] [address]    [phone] Pt. Has an appointment with Dr. Theressa Millard on 09/20/21 @ 10:15   [address], [place] [address]  Phone: [phone]. SW assisted pt with Medicaid transport to her home address.       Raynelle Chary MA, LMHP-R

## 2021-09-08 NOTE — Progress Notes (Signed)
Patient has received her discharge instructions, belongings, and valuables.  Patient will be escorted down to lobby and wait for her ride.  Patient will be discharged at this time.

## 2021-09-08 NOTE — Progress Notes (Signed)
Problem: Self Harm/Suicidality  Goal: Will have no self-injury during hospital stay  Description: INTERVENTIONS:  1.  Ensure constant observer at bedside with Q15M safety checks  2.  Maintain a safe environment  3.  Secure patient belongings  4.  Ensure family/visitors adhere to safety recommendations  5.  Ensure safety tray has been added to patient's diet order  6.  Every shift and PRN: Re-assess suicidal risk via Frequent Screener     Outcome: Progressing     Problem: Depression  Goal: Will be euthymic at discharge  Description: INTERVENTIONS:  1. Administer medication as ordered  2. Provide emotional support via 1:1 interaction with staff  3. Encourage involvement in milieu/groups/activities  4. Monitor for social isolation  Outcome: Progressing     Problem: Pain  Goal: Verbalizes/displays adequate comfort level or baseline comfort level  Outcome: Progressing  Patient observed calm/cooperative and compliant with all phases of care. Patient is attending groups and interacting with her peers appropriately. Patient denies SI/HI/AVH and pain.  Patient remains visible out in the milieu and attending groups.  Will continue to provide a safe and therapeutic environment.

## 2021-09-08 NOTE — Behavioral Health Treatment Team (Signed)
Patient appeared to have slept 6 1/2. Has been noted talking and mumbling in her slept and sometimes while awake. Interactions are blunt and limited with selective staff and peers.

## 2021-09-08 NOTE — Discharge Instructions (Signed)
Pt.has an appointment with Dr. Rollen Sox on  10/06/21 @ 3:00 pm   [facility]   [address] [place] , [place] [address]    [phone]     Pt. Has an appointment with Dr. Simonton Abbot on 09/20/21 @ 10:15   [address], [place]

## 2021-09-20 ENCOUNTER — Inpatient Hospital Stay: Admit: 2021-09-20 | Payer: PRIVATE HEALTH INSURANCE | Primary: Internal Medicine

## 2021-09-20 LAB — LABCORP SPECIMEN COLLECTION

## 2022-04-08 ENCOUNTER — Inpatient Hospital Stay
Admit: 2022-04-08 | Discharge: 2022-04-08 | Disposition: A | Discharge status: Home / Self Care | Payer: PRIVATE HEALTH INSURANCE | Attending: Emergency Medicine

## 2022-04-08 DIAGNOSIS — R45851 Suicidal ideations: Secondary | ICD-10-CM

## 2022-04-08 NOTE — ED Notes (Signed)
Discharged to jail     Greig Right, RN  04/08/22 [phone]
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
## 2022-05-05 ENCOUNTER — Encounter
